# Patient Record
Sex: Male | Born: 1945 | Race: White | Hispanic: No | Marital: Married | Smoking: Never smoker
Health system: Southern US, Academic
[De-identification: ages and names within clinical notes are randomized; demographics above are authoritative.]

## PROBLEM LIST (undated history)

## (undated) DIAGNOSIS — M869 Osteomyelitis, unspecified: Secondary | ICD-10-CM

## (undated) DIAGNOSIS — E079 Disorder of thyroid, unspecified: Secondary | ICD-10-CM

## (undated) DIAGNOSIS — R7989 Other specified abnormal findings of blood chemistry: Secondary | ICD-10-CM

## (undated) DIAGNOSIS — I739 Peripheral vascular disease, unspecified: Secondary | ICD-10-CM

## (undated) DIAGNOSIS — C801 Malignant (primary) neoplasm, unspecified: Secondary | ICD-10-CM

## (undated) DIAGNOSIS — R001 Bradycardia, unspecified: Secondary | ICD-10-CM

## (undated) DIAGNOSIS — R0600 Dyspnea, unspecified: Secondary | ICD-10-CM

## (undated) DIAGNOSIS — E785 Hyperlipidemia, unspecified: Secondary | ICD-10-CM

## (undated) DIAGNOSIS — K635 Polyp of colon: Secondary | ICD-10-CM

## (undated) DIAGNOSIS — I10 Essential (primary) hypertension: Secondary | ICD-10-CM

## (undated) DIAGNOSIS — E119 Type 2 diabetes mellitus without complications: Secondary | ICD-10-CM

## (undated) DIAGNOSIS — R0609 Other forms of dyspnea: Secondary | ICD-10-CM

## (undated) DIAGNOSIS — G4730 Sleep apnea, unspecified: Secondary | ICD-10-CM

## (undated) DIAGNOSIS — C61 Malignant neoplasm of prostate: Secondary | ICD-10-CM

## (undated) DIAGNOSIS — I639 Cerebral infarction, unspecified: Secondary | ICD-10-CM

## (undated) DIAGNOSIS — Z973 Presence of spectacles and contact lenses: Secondary | ICD-10-CM

## (undated) DIAGNOSIS — K219 Gastro-esophageal reflux disease without esophagitis: Secondary | ICD-10-CM

## (undated) HISTORY — PX: OTHER SURGICAL HISTORY: SHX169

## (undated) HISTORY — DX: Disorder of thyroid, unspecified: E07.9

## (undated) HISTORY — DX: Osteomyelitis, unspecified: M86.9

## (undated) HISTORY — DX: Essential (primary) hypertension: I10

## (undated) HISTORY — DX: Hyperlipidemia, unspecified: E78.5

## (undated) HISTORY — DX: Other forms of dyspnea: R06.09

## (undated) HISTORY — PX: AMPUTATION: SHX166

## (undated) HISTORY — DX: Other specified abnormal findings of blood chemistry: R79.89

## (undated) HISTORY — DX: Malignant (primary) neoplasm, unspecified: C80.1

## (undated) HISTORY — DX: Polyp of colon: K63.5

## (undated) HISTORY — DX: Peripheral vascular disease, unspecified: I73.9

## (undated) HISTORY — PX: HEMICOLECTOMY: SHX854

## (undated) HISTORY — DX: Type 2 diabetes mellitus without complications: E11.9

## (undated) HISTORY — DX: Dyspnea, unspecified: R06.00

## (undated) HISTORY — DX: Bradycardia, unspecified: R00.1

## (undated) HISTORY — DX: Sleep apnea, unspecified: G47.30

## (undated) HISTORY — DX: Gastro-esophageal reflux disease without esophagitis: K21.9

## (undated) HISTORY — PX: HX APPENDECTOMY: SHX54

## (undated) HISTORY — PX: TOE AMPUTATION: SHX809

## (undated) HISTORY — PX: BLADDER SURGERY: SHX569

## (undated) HISTORY — PX: HX COLECTOMY: SHX59

## (undated) HISTORY — DX: Type 2 diabetes mellitus without complications (CMS HCC): E11.9

## (undated) HISTORY — PX: HX CORONARY ARTERY BYPASS GRAFT: SHX141

## (undated) HISTORY — DX: Malignant neoplasm of prostate (CMS HCC): C61

## (undated) HISTORY — DX: Presence of spectacles and contact lenses: Z97.3

## (undated) HISTORY — PX: CORONARY ARTERY ANGIOPLASTY: PR CATH30428

## (undated) HISTORY — DX: Cerebral infarction, unspecified (CMS HCC): I63.9

## (undated) HISTORY — PX: CARDIAC PACEMAKER PLACEMENT: SHX583

---

## 2010-05-28 DIAGNOSIS — R079 Chest pain, unspecified: Secondary | ICD-10-CM

## 2011-10-04 DIAGNOSIS — E119 Type 2 diabetes mellitus without complications: Secondary | ICD-10-CM | POA: Diagnosis not present

## 2011-10-04 DIAGNOSIS — E1149 Type 2 diabetes mellitus with other diabetic neurological complication: Secondary | ICD-10-CM | POA: Diagnosis not present

## 2011-10-11 DIAGNOSIS — E782 Mixed hyperlipidemia: Secondary | ICD-10-CM | POA: Diagnosis not present

## 2011-11-09 ENCOUNTER — Telehealth: Payer: Self-pay | Admitting: Vascular Surgery

## 2011-11-09 NOTE — Telephone Encounter (Signed)
Per Rip Harbour, "done"

## 2011-11-09 NOTE — Telephone Encounter (Signed)
Message copied by Gena Fray on Tue Nov 09, 2011 12:55 PM ------      Message from: Dimple Nanas      Created: Tue Nov 09, 2011 10:09 AM      Contact: [phone]       done      ----- Message -----         From: Gena Fray         Sent: 11/09/2011   9:33 AM           To: Dimple Nanas                        ----- Message -----         From: Merleen Nicely         Sent: 11/09/2011   9:27 AM           To: [facility] Admin Pool            Larry Hart DOB: 01/30/1944 wants to see if we received the referral from his doc. He wants to make an appointment. Please call.            Thanks      Ebony Hail

## 2011-11-22 ENCOUNTER — Encounter: Payer: BC Managed Care – PPO | Admitting: Vascular Surgery

## 2011-11-22 DIAGNOSIS — L089 Local infection of the skin and subcutaneous tissue, unspecified: Secondary | ICD-10-CM | POA: Diagnosis not present

## 2011-11-22 DIAGNOSIS — S90426A Blister (nonthermal), unspecified lesser toe(s), initial encounter: Secondary | ICD-10-CM | POA: Diagnosis not present

## 2011-11-22 DIAGNOSIS — E119 Type 2 diabetes mellitus without complications: Secondary | ICD-10-CM | POA: Diagnosis not present

## 2011-11-29 DIAGNOSIS — L02619 Cutaneous abscess of unspecified foot: Secondary | ICD-10-CM | POA: Diagnosis not present

## 2011-11-29 DIAGNOSIS — L03119 Cellulitis of unspecified part of limb: Secondary | ICD-10-CM | POA: Diagnosis not present

## 2011-12-06 DIAGNOSIS — IMO0002 Reserved for concepts with insufficient information to code with codable children: Secondary | ICD-10-CM | POA: Diagnosis not present

## 2011-12-06 DIAGNOSIS — L03039 Cellulitis of unspecified toe: Secondary | ICD-10-CM | POA: Diagnosis not present

## 2011-12-06 DIAGNOSIS — M79609 Pain in unspecified limb: Secondary | ICD-10-CM | POA: Diagnosis not present

## 2011-12-06 DIAGNOSIS — L02619 Cutaneous abscess of unspecified foot: Secondary | ICD-10-CM | POA: Diagnosis not present

## 2011-12-20 DIAGNOSIS — E119 Type 2 diabetes mellitus without complications: Secondary | ICD-10-CM | POA: Diagnosis not present

## 2011-12-20 DIAGNOSIS — L089 Local infection of the skin and subcutaneous tissue, unspecified: Secondary | ICD-10-CM | POA: Diagnosis not present

## 2011-12-20 DIAGNOSIS — E1149 Type 2 diabetes mellitus with other diabetic neurological complication: Secondary | ICD-10-CM | POA: Diagnosis not present

## 2011-12-22 DIAGNOSIS — E785 Hyperlipidemia, unspecified: Secondary | ICD-10-CM | POA: Diagnosis not present

## 2011-12-22 DIAGNOSIS — I739 Peripheral vascular disease, unspecified: Secondary | ICD-10-CM | POA: Diagnosis not present

## 2011-12-22 DIAGNOSIS — Z794 Long term (current) use of insulin: Secondary | ICD-10-CM | POA: Diagnosis not present

## 2011-12-22 DIAGNOSIS — R0602 Shortness of breath: Secondary | ICD-10-CM | POA: Diagnosis not present

## 2011-12-22 DIAGNOSIS — Z8249 Family history of ischemic heart disease and other diseases of the circulatory system: Secondary | ICD-10-CM | POA: Diagnosis not present

## 2011-12-22 DIAGNOSIS — E1142 Type 2 diabetes mellitus with diabetic polyneuropathy: Secondary | ICD-10-CM | POA: Diagnosis not present

## 2011-12-22 DIAGNOSIS — R0789 Other chest pain: Secondary | ICD-10-CM | POA: Diagnosis not present

## 2011-12-22 DIAGNOSIS — Z79899 Other long term (current) drug therapy: Secondary | ICD-10-CM | POA: Diagnosis not present

## 2011-12-22 DIAGNOSIS — R079 Chest pain, unspecified: Secondary | ICD-10-CM | POA: Diagnosis not present

## 2011-12-22 DIAGNOSIS — R030 Elevated blood-pressure reading, without diagnosis of hypertension: Secondary | ICD-10-CM | POA: Diagnosis not present

## 2011-12-22 DIAGNOSIS — R002 Palpitations: Secondary | ICD-10-CM | POA: Diagnosis not present

## 2011-12-22 DIAGNOSIS — F411 Generalized anxiety disorder: Secondary | ICD-10-CM | POA: Diagnosis not present

## 2011-12-22 DIAGNOSIS — Z7982 Long term (current) use of aspirin: Secondary | ICD-10-CM | POA: Diagnosis not present

## 2011-12-22 DIAGNOSIS — Z888 Allergy status to other drugs, medicaments and biological substances status: Secondary | ICD-10-CM | POA: Diagnosis not present

## 2011-12-22 DIAGNOSIS — E1149 Type 2 diabetes mellitus with other diabetic neurological complication: Secondary | ICD-10-CM | POA: Diagnosis not present

## 2011-12-22 DIAGNOSIS — E119 Type 2 diabetes mellitus without complications: Secondary | ICD-10-CM | POA: Diagnosis not present

## 2011-12-22 DIAGNOSIS — I10 Essential (primary) hypertension: Secondary | ICD-10-CM | POA: Diagnosis not present

## 2011-12-22 DIAGNOSIS — R0989 Other specified symptoms and signs involving the circulatory and respiratory systems: Secondary | ICD-10-CM | POA: Diagnosis not present

## 2011-12-22 DIAGNOSIS — Z2821 Immunization not carried out because of patient refusal: Secondary | ICD-10-CM | POA: Diagnosis not present

## 2011-12-22 DIAGNOSIS — S98139A Complete traumatic amputation of one unspecified lesser toe, initial encounter: Secondary | ICD-10-CM | POA: Diagnosis not present

## 2011-12-23 DIAGNOSIS — R079 Chest pain, unspecified: Secondary | ICD-10-CM | POA: Diagnosis not present

## 2011-12-23 DIAGNOSIS — I209 Angina pectoris, unspecified: Secondary | ICD-10-CM | POA: Diagnosis not present

## 2011-12-23 DIAGNOSIS — R0602 Shortness of breath: Secondary | ICD-10-CM | POA: Diagnosis not present

## 2011-12-23 DIAGNOSIS — R002 Palpitations: Secondary | ICD-10-CM | POA: Diagnosis not present

## 2011-12-27 ENCOUNTER — Encounter: Payer: Self-pay | Admitting: Physician Assistant

## 2011-12-27 DIAGNOSIS — R0602 Shortness of breath: Secondary | ICD-10-CM | POA: Diagnosis not present

## 2011-12-27 DIAGNOSIS — R079 Chest pain, unspecified: Secondary | ICD-10-CM | POA: Diagnosis not present

## 2011-12-31 ENCOUNTER — Telehealth: Payer: Self-pay | Admitting: *Deleted

## 2011-12-31 NOTE — Telephone Encounter (Signed)
Message copied by Laurine Blazer on Fri Dec 31, 2011 10:09 AM ------      Message from: Aurora Mask C      Created: Thu Dec 30, 2011  3:49 PM       Nondiagnostic stress test, secondary to suboptimal heart rate. Need to review at followup office visit.

## 2011-12-31 NOTE — Telephone Encounter (Signed)
Notes Recorded by Laurine Blazer, LPN on [id number] at [id number] AM Patient notified. Has eph scheduled for 10/3 with Gene. ------  Notes Recorded by Laurine Blazer, LPN on [id number] at 579FGE AM Left message to return call.

## 2012-01-03 ENCOUNTER — Encounter: Payer: PRIVATE HEALTH INSURANCE | Admitting: Cardiology

## 2012-01-04 ENCOUNTER — Encounter: Payer: Self-pay | Admitting: Cardiology

## 2012-01-06 DIAGNOSIS — G619 Inflammatory polyneuropathy, unspecified: Secondary | ICD-10-CM | POA: Diagnosis not present

## 2012-01-06 DIAGNOSIS — I10 Essential (primary) hypertension: Secondary | ICD-10-CM | POA: Diagnosis not present

## 2012-01-06 DIAGNOSIS — G622 Polyneuropathy due to other toxic agents: Secondary | ICD-10-CM | POA: Diagnosis not present

## 2012-01-11 DIAGNOSIS — E7800 Pure hypercholesterolemia, unspecified: Secondary | ICD-10-CM | POA: Diagnosis not present

## 2012-01-13 ENCOUNTER — Ambulatory Visit (INDEPENDENT_AMBULATORY_CARE_PROVIDER_SITE_OTHER): Payer: Medicare Other | Admitting: Physician Assistant

## 2012-01-13 ENCOUNTER — Encounter: Payer: Self-pay | Admitting: Physician Assistant

## 2012-01-13 ENCOUNTER — Other Ambulatory Visit: Payer: Self-pay | Admitting: Physician Assistant

## 2012-01-13 ENCOUNTER — Encounter: Payer: Self-pay | Admitting: *Deleted

## 2012-01-13 VITALS — BP 142/72 | HR 60 | Ht 71.0 in | Wt 212.0 lb

## 2012-01-13 DIAGNOSIS — R0609 Other forms of dyspnea: Secondary | ICD-10-CM | POA: Diagnosis not present

## 2012-01-13 DIAGNOSIS — I10 Essential (primary) hypertension: Secondary | ICD-10-CM | POA: Diagnosis not present

## 2012-01-13 DIAGNOSIS — I498 Other specified cardiac arrhythmias: Secondary | ICD-10-CM

## 2012-01-13 DIAGNOSIS — E119 Type 2 diabetes mellitus without complications: Secondary | ICD-10-CM

## 2012-01-13 DIAGNOSIS — R001 Bradycardia, unspecified: Secondary | ICD-10-CM

## 2012-01-13 DIAGNOSIS — R0989 Other specified symptoms and signs involving the circulatory and respiratory systems: Secondary | ICD-10-CM

## 2012-01-13 NOTE — Progress Notes (Signed)
Primary Cardiologist: Minus Breeding, MD   HPI: Post hospital followup from [facility], following evaluation for CP. No known history of CAD, with a normal Lexiscan Cardiolite, 2/12. He ruled out for MI with NL troponins.   -2-D echo: EF 60-65%, no focal WMAs, no significant abnormalities, no pericardial effusion.  We recommended further evaluation as an outpatient with a routine GXT. This was performed September 16; unfortunately, this was a suboptimal study (72% PMHR), after patient exercised for 4 1/2 minutes. No chest pain was reported. Patient did complain of SOB. Equivocal ST segment changes, with no definite arrhythmias noted.  Clinically, he continues to experience significant DOE, but no chest pain. He was recently placed on amlodipine 10 mg daily, per Dr. Nadara Mustard. He has been very compulsive in taking ambulatory BP readings at home and presented these today. In general, he has systolic readings in [id number] range. Of note, he did not take any of his HTN medications this morning, and had a reading of 188/91 prior to coming to the office.  Allergies  Allergen Reactions  . Honey Bee Treatment (Bee Venom)     Current Outpatient Prescriptions  Medication Sig Dispense Refill  . amLODipine (NORVASC) 10 MG tablet Take 10 mg by mouth daily.      Marland Kitchen aspirin 81 MG tablet Take 81 mg by mouth daily.      . cetirizine (ZYRTEC) 10 MG tablet Take 10 mg by mouth daily.      . Chromium-Cinnamon (CINNAMON PLUS CHROMIUM PO) Take 500 mg by mouth daily.      . clonazePAM (KLONOPIN) 0.5 MG tablet Take 0.25 mg by mouth 3 (three) times daily.      . Evening Primrose topical oil Apply 1,300 mg topically daily.      . fenofibrate 160 MG tablet Take 160 mg by mouth daily.      Marland Kitchen gabapentin (NEURONTIN) 600 MG tablet Take 1,200 mg by mouth 3 (three) times daily.       . Garlic 123XX123 MG CAPS Take 1,000 mg by mouth daily.      . hydrochlorothiazide (HYDRODIURIL) 25 MG tablet Take 25 mg by mouth daily.      . insulin  glargine (LANTUS) 100 UNIT/ML injection Inject 60 Units into the skin at bedtime.      . insulin lispro (HUMALOG) 100 UNIT/ML injection Inject into the skin 3 (three) times daily before meals.      Marland Kitchen lisinopril (PRINIVIL,ZESTRIL) 10 MG tablet Take 10 mg by mouth daily.      Marland Kitchen NITROSTAT 0.4 MG SL tablet Place 0.4 mg under the tongue every 5 (five) minutes as needed.       Marland Kitchen omega-3 acid ethyl esters (LOVAZA) 1 G capsule Take 2 g by mouth 2 (two) times daily.      Marland Kitchen Ubiquinol 200 MG CAPS Take 200 mg by mouth daily.      Marland Kitchen Zn-Pyg Afri-Nettle-Saw Palmet (SAW PALMETTO COMPLEX PO) Take 1 capsule by mouth daily.        Past Medical History  Diagnosis Date  . Diabetes mellitus   . Hypertension   . Dyslipidemia   . Peripheral vascular disease   . Colon polyp     Status post hemicolectomy  . Osteomyelitis     Status post amputation  . Dyspnea on exertion   . Sinus bradycardia     Past Surgical History  Procedure Date  . Amputation   . Hemicolectomy     History   Social History  .  Marital Status: Married    Spouse Name: N/A    Number of Children: N/A  . Years of Education: N/A   Occupational History  . Not on file.   Social History Main Topics  . Smoking status: Never Smoker   . Smokeless tobacco: Not on file  . Alcohol Use: No  . Drug Use: No  . Sexually Active: Not on file   Other Topics Concern  . Not on file   Social History Narrative  . No narrative on file    Family History  Problem Relation Age of Onset  . Diabetes    . Hypertension    . Coronary artery disease Father 58    ROS: no nausea, vomiting; no fever, chills; no melena, hematochezia; no claudication  PHYSICAL EXAM: BP 142/72  Pulse 60  Ht 5\' 11"  (1.803 m)  Wt 212 lb (96.163 kg)  BMI 29.57 kg/m2 GENERAL: 66 year-old male, obese; NAD HEENT: NCAT, PERRLA, EOMI; sclera clear; no xanthelasma NECK: no JVD; no TM LUNGS: CTA bilaterally CARDIAC: RRR (S1, S2); no significant murmurs; no rubs or  gallops ABDOMEN: Protuberant EXTREMETIES: no significant peripheral edema SKIN: warm/dry; no obvious rash/lesions MUSCULOSKELETAL: no joint deformity NEURO: no focal deficit; NL affect   EKG:    ASSESSMENT & PLAN:  Dyspnea on exertion Will proceed with a dobutamine stress echocardiogram for risk stratification, in light of recent nondiagnostic suboptimal routine GXT. Patient continues to experience significant DOE, with no associated CP. I am concerned that this represents his anginal equivalent. He has multiple CRFs, including DM. If this study is suggestive of ischemia, then we will pursue further evaluation of the cardiac catheterization. We did discuss this possibility today. On the other hand, if the study is negative, then I would suggest no further workup, and patient is to continue an aggressive low fat/salt diet and exercise program. Patient is to take his usual a.m. medications, prior to the procedure.  Hypertension Improved, following recent addition of amlodipine 10 mg daily.  Sinus bradycardia Patient has not had any rate slowing agents. There was no evidence of dysrhythmia during recent routine GXT.  Diabetes mellitus Followed by primary M.D.    Gene Shataya Winkles, PAC A assessed at this is

## 2012-01-13 NOTE — Assessment & Plan Note (Signed)
Improved, following recent addition of amlodipine 10 mg daily.

## 2012-01-13 NOTE — Assessment & Plan Note (Signed)
Followed by primary M.D. 

## 2012-01-13 NOTE — Patient Instructions (Addendum)
   Dobutamine stress echo  Office will contact with results Continue all current medications.  Follow up in  2 weeks

## 2012-01-13 NOTE — Assessment & Plan Note (Signed)
Patient has not had any rate slowing agents. There was no evidence of dysrhythmia during recent routine GXT.

## 2012-01-13 NOTE — Assessment & Plan Note (Addendum)
Will proceed with a dobutamine stress echocardiogram for risk stratification, in light of recent nondiagnostic suboptimal routine GXT. Patient continues to experience significant DOE, with no associated CP. I am concerned that this represents his anginal equivalent. He has multiple CRFs, including DM. If this study is suggestive of ischemia, then we will pursue further evaluation of the cardiac catheterization. We did discuss this possibility today. On the other hand, if the study is negative, then I would suggest no further workup, and patient is to continue an aggressive low fat/salt diet and exercise program. Patient is to take his usual a.m. medications, prior to the procedure.

## 2012-01-19 DIAGNOSIS — R0609 Other forms of dyspnea: Secondary | ICD-10-CM | POA: Diagnosis not present

## 2012-01-19 DIAGNOSIS — R079 Chest pain, unspecified: Secondary | ICD-10-CM | POA: Diagnosis not present

## 2012-01-25 DIAGNOSIS — G622 Polyneuropathy due to other toxic agents: Secondary | ICD-10-CM | POA: Diagnosis not present

## 2012-01-25 DIAGNOSIS — G619 Inflammatory polyneuropathy, unspecified: Secondary | ICD-10-CM | POA: Diagnosis not present

## 2012-01-25 DIAGNOSIS — I10 Essential (primary) hypertension: Secondary | ICD-10-CM | POA: Diagnosis not present

## 2012-01-25 DIAGNOSIS — Z23 Encounter for immunization: Secondary | ICD-10-CM | POA: Diagnosis not present

## 2012-01-26 ENCOUNTER — Ambulatory Visit: Payer: Medicare Other | Admitting: Physician Assistant

## 2012-01-27 DIAGNOSIS — R0602 Shortness of breath: Secondary | ICD-10-CM | POA: Diagnosis not present

## 2012-02-15 DIAGNOSIS — E11319 Type 2 diabetes mellitus with unspecified diabetic retinopathy without macular edema: Secondary | ICD-10-CM | POA: Diagnosis not present

## 2012-02-15 DIAGNOSIS — Z961 Presence of intraocular lens: Secondary | ICD-10-CM | POA: Diagnosis not present

## 2012-02-15 DIAGNOSIS — H3560 Retinal hemorrhage, unspecified eye: Secondary | ICD-10-CM | POA: Diagnosis not present

## 2012-02-17 DIAGNOSIS — H3582 Retinal ischemia: Secondary | ICD-10-CM | POA: Diagnosis not present

## 2012-02-21 DIAGNOSIS — L97509 Non-pressure chronic ulcer of other part of unspecified foot with unspecified severity: Secondary | ICD-10-CM | POA: Diagnosis not present

## 2012-02-21 DIAGNOSIS — E119 Type 2 diabetes mellitus without complications: Secondary | ICD-10-CM | POA: Diagnosis not present

## 2012-02-22 DIAGNOSIS — B353 Tinea pedis: Secondary | ICD-10-CM | POA: Diagnosis not present

## 2012-02-22 DIAGNOSIS — L570 Actinic keratosis: Secondary | ICD-10-CM | POA: Diagnosis not present

## 2012-02-22 DIAGNOSIS — L219 Seborrheic dermatitis, unspecified: Secondary | ICD-10-CM | POA: Diagnosis not present

## 2012-02-22 DIAGNOSIS — B359 Dermatophytosis, unspecified: Secondary | ICD-10-CM | POA: Diagnosis not present

## 2012-03-06 DIAGNOSIS — E1149 Type 2 diabetes mellitus with other diabetic neurological complication: Secondary | ICD-10-CM | POA: Diagnosis not present

## 2012-03-06 DIAGNOSIS — E119 Type 2 diabetes mellitus without complications: Secondary | ICD-10-CM | POA: Diagnosis not present

## 2012-03-08 DIAGNOSIS — I10 Essential (primary) hypertension: Secondary | ICD-10-CM | POA: Diagnosis not present

## 2012-03-08 DIAGNOSIS — G619 Inflammatory polyneuropathy, unspecified: Secondary | ICD-10-CM | POA: Diagnosis not present

## 2012-03-08 DIAGNOSIS — G622 Polyneuropathy due to other toxic agents: Secondary | ICD-10-CM | POA: Diagnosis not present

## 2012-03-17 DIAGNOSIS — Z7982 Long term (current) use of aspirin: Secondary | ICD-10-CM | POA: Diagnosis not present

## 2012-03-17 DIAGNOSIS — Z79899 Other long term (current) drug therapy: Secondary | ICD-10-CM | POA: Diagnosis not present

## 2012-03-17 DIAGNOSIS — E119 Type 2 diabetes mellitus without complications: Secondary | ICD-10-CM | POA: Diagnosis not present

## 2012-03-17 DIAGNOSIS — Z794 Long term (current) use of insulin: Secondary | ICD-10-CM | POA: Diagnosis not present

## 2012-03-17 DIAGNOSIS — I10 Essential (primary) hypertension: Secondary | ICD-10-CM | POA: Diagnosis not present

## 2012-03-17 DIAGNOSIS — F411 Generalized anxiety disorder: Secondary | ICD-10-CM | POA: Diagnosis not present

## 2012-03-21 DIAGNOSIS — S335XXA Sprain of ligaments of lumbar spine, initial encounter: Secondary | ICD-10-CM | POA: Diagnosis not present

## 2012-03-21 DIAGNOSIS — M999 Biomechanical lesion, unspecified: Secondary | ICD-10-CM | POA: Diagnosis not present

## 2012-03-21 DIAGNOSIS — M5430 Sciatica, unspecified side: Secondary | ICD-10-CM | POA: Diagnosis not present

## 2012-03-23 DIAGNOSIS — E1139 Type 2 diabetes mellitus with other diabetic ophthalmic complication: Secondary | ICD-10-CM | POA: Diagnosis not present

## 2012-03-23 DIAGNOSIS — E11311 Type 2 diabetes mellitus with unspecified diabetic retinopathy with macular edema: Secondary | ICD-10-CM | POA: Diagnosis not present

## 2012-03-23 DIAGNOSIS — H579 Unspecified disorder of eye and adnexa: Secondary | ICD-10-CM | POA: Diagnosis not present

## 2012-03-30 DIAGNOSIS — G619 Inflammatory polyneuropathy, unspecified: Secondary | ICD-10-CM | POA: Diagnosis not present

## 2012-03-30 DIAGNOSIS — G622 Polyneuropathy due to other toxic agents: Secondary | ICD-10-CM | POA: Diagnosis not present

## 2012-03-30 DIAGNOSIS — I10 Essential (primary) hypertension: Secondary | ICD-10-CM | POA: Diagnosis not present

## 2012-05-15 DIAGNOSIS — E1149 Type 2 diabetes mellitus with other diabetic neurological complication: Secondary | ICD-10-CM | POA: Diagnosis not present

## 2012-05-15 DIAGNOSIS — E119 Type 2 diabetes mellitus without complications: Secondary | ICD-10-CM | POA: Diagnosis not present

## 2012-05-16 DIAGNOSIS — I10 Essential (primary) hypertension: Secondary | ICD-10-CM | POA: Diagnosis not present

## 2012-05-16 DIAGNOSIS — G619 Inflammatory polyneuropathy, unspecified: Secondary | ICD-10-CM | POA: Diagnosis not present

## 2012-06-05 DIAGNOSIS — L97509 Non-pressure chronic ulcer of other part of unspecified foot with unspecified severity: Secondary | ICD-10-CM | POA: Diagnosis not present

## 2012-06-05 DIAGNOSIS — M79609 Pain in unspecified limb: Secondary | ICD-10-CM | POA: Diagnosis not present

## 2012-06-07 DIAGNOSIS — L0291 Cutaneous abscess, unspecified: Secondary | ICD-10-CM | POA: Diagnosis not present

## 2012-06-07 DIAGNOSIS — L0390 Cellulitis, unspecified: Secondary | ICD-10-CM | POA: Diagnosis not present

## 2012-06-07 DIAGNOSIS — L570 Actinic keratosis: Secondary | ICD-10-CM | POA: Diagnosis not present

## 2012-06-07 DIAGNOSIS — L219 Seborrheic dermatitis, unspecified: Secondary | ICD-10-CM | POA: Diagnosis not present

## 2012-06-19 DIAGNOSIS — L97509 Non-pressure chronic ulcer of other part of unspecified foot with unspecified severity: Secondary | ICD-10-CM | POA: Diagnosis not present

## 2012-06-19 DIAGNOSIS — E119 Type 2 diabetes mellitus without complications: Secondary | ICD-10-CM | POA: Diagnosis not present

## 2012-07-03 DIAGNOSIS — E119 Type 2 diabetes mellitus without complications: Secondary | ICD-10-CM | POA: Diagnosis not present

## 2012-07-03 DIAGNOSIS — L97509 Non-pressure chronic ulcer of other part of unspecified foot with unspecified severity: Secondary | ICD-10-CM | POA: Diagnosis not present

## 2012-07-11 DIAGNOSIS — E782 Mixed hyperlipidemia: Secondary | ICD-10-CM | POA: Diagnosis not present

## 2012-07-11 DIAGNOSIS — E785 Hyperlipidemia, unspecified: Secondary | ICD-10-CM | POA: Diagnosis not present

## 2012-07-24 DIAGNOSIS — L97509 Non-pressure chronic ulcer of other part of unspecified foot with unspecified severity: Secondary | ICD-10-CM | POA: Diagnosis not present

## 2012-07-24 DIAGNOSIS — E119 Type 2 diabetes mellitus without complications: Secondary | ICD-10-CM | POA: Diagnosis not present

## 2012-08-10 DIAGNOSIS — E11311 Type 2 diabetes mellitus with unspecified diabetic retinopathy with macular edema: Secondary | ICD-10-CM | POA: Diagnosis not present

## 2012-08-10 DIAGNOSIS — E1139 Type 2 diabetes mellitus with other diabetic ophthalmic complication: Secondary | ICD-10-CM | POA: Diagnosis not present

## 2012-08-21 DIAGNOSIS — E1149 Type 2 diabetes mellitus with other diabetic neurological complication: Secondary | ICD-10-CM | POA: Diagnosis not present

## 2012-08-21 DIAGNOSIS — E119 Type 2 diabetes mellitus without complications: Secondary | ICD-10-CM | POA: Diagnosis not present

## 2012-08-23 DIAGNOSIS — G619 Inflammatory polyneuropathy, unspecified: Secondary | ICD-10-CM | POA: Diagnosis not present

## 2012-08-23 DIAGNOSIS — E119 Type 2 diabetes mellitus without complications: Secondary | ICD-10-CM | POA: Diagnosis not present

## 2012-08-23 DIAGNOSIS — E559 Vitamin D deficiency, unspecified: Secondary | ICD-10-CM | POA: Diagnosis not present

## 2012-08-23 DIAGNOSIS — I10 Essential (primary) hypertension: Secondary | ICD-10-CM | POA: Diagnosis not present

## 2012-08-23 DIAGNOSIS — E538 Deficiency of other specified B group vitamins: Secondary | ICD-10-CM | POA: Diagnosis not present

## 2012-09-26 DIAGNOSIS — R5383 Other fatigue: Secondary | ICD-10-CM | POA: Diagnosis not present

## 2012-10-02 DIAGNOSIS — M4712 Other spondylosis with myelopathy, cervical region: Secondary | ICD-10-CM | POA: Diagnosis not present

## 2012-10-02 DIAGNOSIS — M47817 Spondylosis without myelopathy or radiculopathy, lumbosacral region: Secondary | ICD-10-CM | POA: Diagnosis not present

## 2012-10-02 DIAGNOSIS — M5430 Sciatica, unspecified side: Secondary | ICD-10-CM | POA: Diagnosis not present

## 2012-10-02 DIAGNOSIS — M546 Pain in thoracic spine: Secondary | ICD-10-CM | POA: Diagnosis not present

## 2012-10-02 DIAGNOSIS — M999 Biomechanical lesion, unspecified: Secondary | ICD-10-CM | POA: Diagnosis not present

## 2012-10-02 DIAGNOSIS — M9981 Other biomechanical lesions of cervical region: Secondary | ICD-10-CM | POA: Diagnosis not present

## 2012-10-03 DIAGNOSIS — M5430 Sciatica, unspecified side: Secondary | ICD-10-CM | POA: Diagnosis not present

## 2012-10-03 DIAGNOSIS — M9981 Other biomechanical lesions of cervical region: Secondary | ICD-10-CM | POA: Diagnosis not present

## 2012-10-03 DIAGNOSIS — M47817 Spondylosis without myelopathy or radiculopathy, lumbosacral region: Secondary | ICD-10-CM | POA: Diagnosis not present

## 2012-10-03 DIAGNOSIS — M999 Biomechanical lesion, unspecified: Secondary | ICD-10-CM | POA: Diagnosis not present

## 2012-10-03 DIAGNOSIS — M4712 Other spondylosis with myelopathy, cervical region: Secondary | ICD-10-CM | POA: Diagnosis not present

## 2012-10-03 DIAGNOSIS — M546 Pain in thoracic spine: Secondary | ICD-10-CM | POA: Diagnosis not present

## 2012-10-05 DIAGNOSIS — M4712 Other spondylosis with myelopathy, cervical region: Secondary | ICD-10-CM | POA: Diagnosis not present

## 2012-10-05 DIAGNOSIS — M546 Pain in thoracic spine: Secondary | ICD-10-CM | POA: Diagnosis not present

## 2012-10-05 DIAGNOSIS — M9981 Other biomechanical lesions of cervical region: Secondary | ICD-10-CM | POA: Diagnosis not present

## 2012-10-05 DIAGNOSIS — M47817 Spondylosis without myelopathy or radiculopathy, lumbosacral region: Secondary | ICD-10-CM | POA: Diagnosis not present

## 2012-10-05 DIAGNOSIS — M999 Biomechanical lesion, unspecified: Secondary | ICD-10-CM | POA: Diagnosis not present

## 2012-10-05 DIAGNOSIS — M5430 Sciatica, unspecified side: Secondary | ICD-10-CM | POA: Diagnosis not present

## 2012-10-09 DIAGNOSIS — M9981 Other biomechanical lesions of cervical region: Secondary | ICD-10-CM | POA: Diagnosis not present

## 2012-10-09 DIAGNOSIS — M999 Biomechanical lesion, unspecified: Secondary | ICD-10-CM | POA: Diagnosis not present

## 2012-10-09 DIAGNOSIS — M5430 Sciatica, unspecified side: Secondary | ICD-10-CM | POA: Diagnosis not present

## 2012-10-09 DIAGNOSIS — M546 Pain in thoracic spine: Secondary | ICD-10-CM | POA: Diagnosis not present

## 2012-10-09 DIAGNOSIS — M4712 Other spondylosis with myelopathy, cervical region: Secondary | ICD-10-CM | POA: Diagnosis not present

## 2012-10-09 DIAGNOSIS — M47817 Spondylosis without myelopathy or radiculopathy, lumbosacral region: Secondary | ICD-10-CM | POA: Diagnosis not present

## 2012-10-16 DIAGNOSIS — M4712 Other spondylosis with myelopathy, cervical region: Secondary | ICD-10-CM | POA: Diagnosis not present

## 2012-10-16 DIAGNOSIS — M546 Pain in thoracic spine: Secondary | ICD-10-CM | POA: Diagnosis not present

## 2012-10-16 DIAGNOSIS — M9981 Other biomechanical lesions of cervical region: Secondary | ICD-10-CM | POA: Diagnosis not present

## 2012-10-16 DIAGNOSIS — M5430 Sciatica, unspecified side: Secondary | ICD-10-CM | POA: Diagnosis not present

## 2012-10-16 DIAGNOSIS — M999 Biomechanical lesion, unspecified: Secondary | ICD-10-CM | POA: Diagnosis not present

## 2012-10-16 DIAGNOSIS — M47817 Spondylosis without myelopathy or radiculopathy, lumbosacral region: Secondary | ICD-10-CM | POA: Diagnosis not present

## 2012-10-19 DIAGNOSIS — M546 Pain in thoracic spine: Secondary | ICD-10-CM | POA: Diagnosis not present

## 2012-10-19 DIAGNOSIS — M47817 Spondylosis without myelopathy or radiculopathy, lumbosacral region: Secondary | ICD-10-CM | POA: Diagnosis not present

## 2012-10-19 DIAGNOSIS — M999 Biomechanical lesion, unspecified: Secondary | ICD-10-CM | POA: Diagnosis not present

## 2012-10-19 DIAGNOSIS — M4712 Other spondylosis with myelopathy, cervical region: Secondary | ICD-10-CM | POA: Diagnosis not present

## 2012-10-19 DIAGNOSIS — M5430 Sciatica, unspecified side: Secondary | ICD-10-CM | POA: Diagnosis not present

## 2012-10-19 DIAGNOSIS — M9981 Other biomechanical lesions of cervical region: Secondary | ICD-10-CM | POA: Diagnosis not present

## 2012-10-25 DIAGNOSIS — M47817 Spondylosis without myelopathy or radiculopathy, lumbosacral region: Secondary | ICD-10-CM | POA: Diagnosis not present

## 2012-10-25 DIAGNOSIS — M9981 Other biomechanical lesions of cervical region: Secondary | ICD-10-CM | POA: Diagnosis not present

## 2012-10-25 DIAGNOSIS — M4712 Other spondylosis with myelopathy, cervical region: Secondary | ICD-10-CM | POA: Diagnosis not present

## 2012-10-25 DIAGNOSIS — M5430 Sciatica, unspecified side: Secondary | ICD-10-CM | POA: Diagnosis not present

## 2012-10-25 DIAGNOSIS — M999 Biomechanical lesion, unspecified: Secondary | ICD-10-CM | POA: Diagnosis not present

## 2012-10-25 DIAGNOSIS — M546 Pain in thoracic spine: Secondary | ICD-10-CM | POA: Diagnosis not present

## 2012-10-30 DIAGNOSIS — E119 Type 2 diabetes mellitus without complications: Secondary | ICD-10-CM | POA: Diagnosis not present

## 2012-10-30 DIAGNOSIS — E1149 Type 2 diabetes mellitus with other diabetic neurological complication: Secondary | ICD-10-CM | POA: Diagnosis not present

## 2012-11-01 DIAGNOSIS — M5430 Sciatica, unspecified side: Secondary | ICD-10-CM | POA: Diagnosis not present

## 2012-11-01 DIAGNOSIS — M999 Biomechanical lesion, unspecified: Secondary | ICD-10-CM | POA: Diagnosis not present

## 2012-11-01 DIAGNOSIS — M546 Pain in thoracic spine: Secondary | ICD-10-CM | POA: Diagnosis not present

## 2012-11-01 DIAGNOSIS — M4712 Other spondylosis with myelopathy, cervical region: Secondary | ICD-10-CM | POA: Diagnosis not present

## 2012-11-01 DIAGNOSIS — M47817 Spondylosis without myelopathy or radiculopathy, lumbosacral region: Secondary | ICD-10-CM | POA: Diagnosis not present

## 2012-11-01 DIAGNOSIS — M9981 Other biomechanical lesions of cervical region: Secondary | ICD-10-CM | POA: Diagnosis not present

## 2012-11-08 DIAGNOSIS — M47817 Spondylosis without myelopathy or radiculopathy, lumbosacral region: Secondary | ICD-10-CM | POA: Diagnosis not present

## 2012-11-08 DIAGNOSIS — M9981 Other biomechanical lesions of cervical region: Secondary | ICD-10-CM | POA: Diagnosis not present

## 2012-11-08 DIAGNOSIS — M999 Biomechanical lesion, unspecified: Secondary | ICD-10-CM | POA: Diagnosis not present

## 2012-11-08 DIAGNOSIS — M546 Pain in thoracic spine: Secondary | ICD-10-CM | POA: Diagnosis not present

## 2012-11-08 DIAGNOSIS — M5430 Sciatica, unspecified side: Secondary | ICD-10-CM | POA: Diagnosis not present

## 2012-11-08 DIAGNOSIS — M4712 Other spondylosis with myelopathy, cervical region: Secondary | ICD-10-CM | POA: Diagnosis not present

## 2012-11-22 DIAGNOSIS — M546 Pain in thoracic spine: Secondary | ICD-10-CM | POA: Diagnosis not present

## 2012-11-22 DIAGNOSIS — M9981 Other biomechanical lesions of cervical region: Secondary | ICD-10-CM | POA: Diagnosis not present

## 2012-11-22 DIAGNOSIS — M999 Biomechanical lesion, unspecified: Secondary | ICD-10-CM | POA: Diagnosis not present

## 2012-11-22 DIAGNOSIS — M47817 Spondylosis without myelopathy or radiculopathy, lumbosacral region: Secondary | ICD-10-CM | POA: Diagnosis not present

## 2012-11-22 DIAGNOSIS — M4712 Other spondylosis with myelopathy, cervical region: Secondary | ICD-10-CM | POA: Diagnosis not present

## 2012-11-22 DIAGNOSIS — M5430 Sciatica, unspecified side: Secondary | ICD-10-CM | POA: Diagnosis not present

## 2012-11-23 DIAGNOSIS — R5381 Other malaise: Secondary | ICD-10-CM | POA: Diagnosis not present

## 2012-11-23 DIAGNOSIS — G622 Polyneuropathy due to other toxic agents: Secondary | ICD-10-CM | POA: Diagnosis not present

## 2012-11-23 DIAGNOSIS — I10 Essential (primary) hypertension: Secondary | ICD-10-CM | POA: Diagnosis not present

## 2012-11-23 DIAGNOSIS — E119 Type 2 diabetes mellitus without complications: Secondary | ICD-10-CM | POA: Diagnosis not present

## 2012-11-29 DIAGNOSIS — M9981 Other biomechanical lesions of cervical region: Secondary | ICD-10-CM | POA: Diagnosis not present

## 2012-11-29 DIAGNOSIS — M5430 Sciatica, unspecified side: Secondary | ICD-10-CM | POA: Diagnosis not present

## 2012-11-29 DIAGNOSIS — M999 Biomechanical lesion, unspecified: Secondary | ICD-10-CM | POA: Diagnosis not present

## 2012-11-29 DIAGNOSIS — M546 Pain in thoracic spine: Secondary | ICD-10-CM | POA: Diagnosis not present

## 2012-11-29 DIAGNOSIS — M47817 Spondylosis without myelopathy or radiculopathy, lumbosacral region: Secondary | ICD-10-CM | POA: Diagnosis not present

## 2012-11-29 DIAGNOSIS — M4712 Other spondylosis with myelopathy, cervical region: Secondary | ICD-10-CM | POA: Diagnosis not present

## 2012-11-30 DIAGNOSIS — R5381 Other malaise: Secondary | ICD-10-CM | POA: Diagnosis not present

## 2012-11-30 DIAGNOSIS — E119 Type 2 diabetes mellitus without complications: Secondary | ICD-10-CM | POA: Diagnosis not present

## 2012-11-30 DIAGNOSIS — I10 Essential (primary) hypertension: Secondary | ICD-10-CM | POA: Diagnosis not present

## 2012-11-30 DIAGNOSIS — G619 Inflammatory polyneuropathy, unspecified: Secondary | ICD-10-CM | POA: Diagnosis not present

## 2012-11-30 DIAGNOSIS — F329 Major depressive disorder, single episode, unspecified: Secondary | ICD-10-CM | POA: Diagnosis not present

## 2012-12-04 DIAGNOSIS — M4712 Other spondylosis with myelopathy, cervical region: Secondary | ICD-10-CM | POA: Diagnosis not present

## 2012-12-04 DIAGNOSIS — M546 Pain in thoracic spine: Secondary | ICD-10-CM | POA: Diagnosis not present

## 2012-12-04 DIAGNOSIS — M999 Biomechanical lesion, unspecified: Secondary | ICD-10-CM | POA: Diagnosis not present

## 2012-12-04 DIAGNOSIS — M9981 Other biomechanical lesions of cervical region: Secondary | ICD-10-CM | POA: Diagnosis not present

## 2012-12-04 DIAGNOSIS — L570 Actinic keratosis: Secondary | ICD-10-CM | POA: Diagnosis not present

## 2012-12-04 DIAGNOSIS — M5430 Sciatica, unspecified side: Secondary | ICD-10-CM | POA: Diagnosis not present

## 2012-12-04 DIAGNOSIS — M47817 Spondylosis without myelopathy or radiculopathy, lumbosacral region: Secondary | ICD-10-CM | POA: Diagnosis not present

## 2012-12-04 DIAGNOSIS — L219 Seborrheic dermatitis, unspecified: Secondary | ICD-10-CM | POA: Diagnosis not present

## 2012-12-08 DIAGNOSIS — M4712 Other spondylosis with myelopathy, cervical region: Secondary | ICD-10-CM | POA: Diagnosis not present

## 2012-12-08 DIAGNOSIS — M9981 Other biomechanical lesions of cervical region: Secondary | ICD-10-CM | POA: Diagnosis not present

## 2012-12-08 DIAGNOSIS — M5430 Sciatica, unspecified side: Secondary | ICD-10-CM | POA: Diagnosis not present

## 2012-12-08 DIAGNOSIS — M999 Biomechanical lesion, unspecified: Secondary | ICD-10-CM | POA: Diagnosis not present

## 2012-12-08 DIAGNOSIS — M546 Pain in thoracic spine: Secondary | ICD-10-CM | POA: Diagnosis not present

## 2012-12-08 DIAGNOSIS — M47817 Spondylosis without myelopathy or radiculopathy, lumbosacral region: Secondary | ICD-10-CM | POA: Diagnosis not present

## 2012-12-13 DIAGNOSIS — M47817 Spondylosis without myelopathy or radiculopathy, lumbosacral region: Secondary | ICD-10-CM | POA: Diagnosis not present

## 2012-12-13 DIAGNOSIS — M9981 Other biomechanical lesions of cervical region: Secondary | ICD-10-CM | POA: Diagnosis not present

## 2012-12-13 DIAGNOSIS — M4712 Other spondylosis with myelopathy, cervical region: Secondary | ICD-10-CM | POA: Diagnosis not present

## 2012-12-13 DIAGNOSIS — M999 Biomechanical lesion, unspecified: Secondary | ICD-10-CM | POA: Diagnosis not present

## 2012-12-13 DIAGNOSIS — M5430 Sciatica, unspecified side: Secondary | ICD-10-CM | POA: Diagnosis not present

## 2012-12-13 DIAGNOSIS — M546 Pain in thoracic spine: Secondary | ICD-10-CM | POA: Diagnosis not present

## 2012-12-18 DIAGNOSIS — M999 Biomechanical lesion, unspecified: Secondary | ICD-10-CM | POA: Diagnosis not present

## 2012-12-18 DIAGNOSIS — M9981 Other biomechanical lesions of cervical region: Secondary | ICD-10-CM | POA: Diagnosis not present

## 2012-12-18 DIAGNOSIS — M47817 Spondylosis without myelopathy or radiculopathy, lumbosacral region: Secondary | ICD-10-CM | POA: Diagnosis not present

## 2012-12-18 DIAGNOSIS — M546 Pain in thoracic spine: Secondary | ICD-10-CM | POA: Diagnosis not present

## 2012-12-18 DIAGNOSIS — M4712 Other spondylosis with myelopathy, cervical region: Secondary | ICD-10-CM | POA: Diagnosis not present

## 2012-12-18 DIAGNOSIS — M5430 Sciatica, unspecified side: Secondary | ICD-10-CM | POA: Diagnosis not present

## 2012-12-21 DIAGNOSIS — M4712 Other spondylosis with myelopathy, cervical region: Secondary | ICD-10-CM | POA: Diagnosis not present

## 2012-12-21 DIAGNOSIS — M546 Pain in thoracic spine: Secondary | ICD-10-CM | POA: Diagnosis not present

## 2012-12-21 DIAGNOSIS — M47817 Spondylosis without myelopathy or radiculopathy, lumbosacral region: Secondary | ICD-10-CM | POA: Diagnosis not present

## 2012-12-21 DIAGNOSIS — M9981 Other biomechanical lesions of cervical region: Secondary | ICD-10-CM | POA: Diagnosis not present

## 2012-12-21 DIAGNOSIS — M999 Biomechanical lesion, unspecified: Secondary | ICD-10-CM | POA: Diagnosis not present

## 2012-12-21 DIAGNOSIS — M5430 Sciatica, unspecified side: Secondary | ICD-10-CM | POA: Diagnosis not present

## 2013-01-02 DIAGNOSIS — M5430 Sciatica, unspecified side: Secondary | ICD-10-CM | POA: Diagnosis not present

## 2013-01-02 DIAGNOSIS — M9981 Other biomechanical lesions of cervical region: Secondary | ICD-10-CM | POA: Diagnosis not present

## 2013-01-02 DIAGNOSIS — M47817 Spondylosis without myelopathy or radiculopathy, lumbosacral region: Secondary | ICD-10-CM | POA: Diagnosis not present

## 2013-01-02 DIAGNOSIS — M546 Pain in thoracic spine: Secondary | ICD-10-CM | POA: Diagnosis not present

## 2013-01-02 DIAGNOSIS — M999 Biomechanical lesion, unspecified: Secondary | ICD-10-CM | POA: Diagnosis not present

## 2013-01-02 DIAGNOSIS — M4712 Other spondylosis with myelopathy, cervical region: Secondary | ICD-10-CM | POA: Diagnosis not present

## 2013-01-09 DIAGNOSIS — M546 Pain in thoracic spine: Secondary | ICD-10-CM | POA: Diagnosis not present

## 2013-01-09 DIAGNOSIS — M5430 Sciatica, unspecified side: Secondary | ICD-10-CM | POA: Diagnosis not present

## 2013-01-09 DIAGNOSIS — M4712 Other spondylosis with myelopathy, cervical region: Secondary | ICD-10-CM | POA: Diagnosis not present

## 2013-01-09 DIAGNOSIS — M47817 Spondylosis without myelopathy or radiculopathy, lumbosacral region: Secondary | ICD-10-CM | POA: Diagnosis not present

## 2013-01-09 DIAGNOSIS — M9981 Other biomechanical lesions of cervical region: Secondary | ICD-10-CM | POA: Diagnosis not present

## 2013-01-09 DIAGNOSIS — M999 Biomechanical lesion, unspecified: Secondary | ICD-10-CM | POA: Diagnosis not present

## 2013-01-10 DIAGNOSIS — E038 Other specified hypothyroidism: Secondary | ICD-10-CM | POA: Diagnosis not present

## 2013-01-10 DIAGNOSIS — E7800 Pure hypercholesterolemia, unspecified: Secondary | ICD-10-CM | POA: Diagnosis not present

## 2013-01-10 DIAGNOSIS — E782 Mixed hyperlipidemia: Secondary | ICD-10-CM | POA: Diagnosis not present

## 2013-01-15 DIAGNOSIS — E1149 Type 2 diabetes mellitus with other diabetic neurological complication: Secondary | ICD-10-CM | POA: Diagnosis not present

## 2013-01-15 DIAGNOSIS — E119 Type 2 diabetes mellitus without complications: Secondary | ICD-10-CM | POA: Diagnosis not present

## 2013-01-16 DIAGNOSIS — M9981 Other biomechanical lesions of cervical region: Secondary | ICD-10-CM | POA: Diagnosis not present

## 2013-01-16 DIAGNOSIS — M5430 Sciatica, unspecified side: Secondary | ICD-10-CM | POA: Diagnosis not present

## 2013-01-16 DIAGNOSIS — M999 Biomechanical lesion, unspecified: Secondary | ICD-10-CM | POA: Diagnosis not present

## 2013-01-16 DIAGNOSIS — M4712 Other spondylosis with myelopathy, cervical region: Secondary | ICD-10-CM | POA: Diagnosis not present

## 2013-01-16 DIAGNOSIS — M47817 Spondylosis without myelopathy or radiculopathy, lumbosacral region: Secondary | ICD-10-CM | POA: Diagnosis not present

## 2013-01-16 DIAGNOSIS — M546 Pain in thoracic spine: Secondary | ICD-10-CM | POA: Diagnosis not present

## 2013-01-23 DIAGNOSIS — M5430 Sciatica, unspecified side: Secondary | ICD-10-CM | POA: Diagnosis not present

## 2013-01-23 DIAGNOSIS — M47817 Spondylosis without myelopathy or radiculopathy, lumbosacral region: Secondary | ICD-10-CM | POA: Diagnosis not present

## 2013-01-23 DIAGNOSIS — M9981 Other biomechanical lesions of cervical region: Secondary | ICD-10-CM | POA: Diagnosis not present

## 2013-01-23 DIAGNOSIS — M999 Biomechanical lesion, unspecified: Secondary | ICD-10-CM | POA: Diagnosis not present

## 2013-01-23 DIAGNOSIS — M546 Pain in thoracic spine: Secondary | ICD-10-CM | POA: Diagnosis not present

## 2013-01-23 DIAGNOSIS — M4712 Other spondylosis with myelopathy, cervical region: Secondary | ICD-10-CM | POA: Diagnosis not present

## 2013-02-06 DIAGNOSIS — M5430 Sciatica, unspecified side: Secondary | ICD-10-CM | POA: Diagnosis not present

## 2013-02-06 DIAGNOSIS — M47817 Spondylosis without myelopathy or radiculopathy, lumbosacral region: Secondary | ICD-10-CM | POA: Diagnosis not present

## 2013-02-06 DIAGNOSIS — M546 Pain in thoracic spine: Secondary | ICD-10-CM | POA: Diagnosis not present

## 2013-02-06 DIAGNOSIS — M4712 Other spondylosis with myelopathy, cervical region: Secondary | ICD-10-CM | POA: Diagnosis not present

## 2013-02-06 DIAGNOSIS — M9981 Other biomechanical lesions of cervical region: Secondary | ICD-10-CM | POA: Diagnosis not present

## 2013-02-06 DIAGNOSIS — M999 Biomechanical lesion, unspecified: Secondary | ICD-10-CM | POA: Diagnosis not present

## 2013-02-16 DIAGNOSIS — I10 Essential (primary) hypertension: Secondary | ICD-10-CM | POA: Diagnosis not present

## 2013-02-16 DIAGNOSIS — R5381 Other malaise: Secondary | ICD-10-CM | POA: Diagnosis not present

## 2013-02-20 DIAGNOSIS — E291 Testicular hypofunction: Secondary | ICD-10-CM | POA: Diagnosis not present

## 2013-02-20 DIAGNOSIS — G619 Inflammatory polyneuropathy, unspecified: Secondary | ICD-10-CM | POA: Diagnosis not present

## 2013-02-20 DIAGNOSIS — I10 Essential (primary) hypertension: Secondary | ICD-10-CM | POA: Diagnosis not present

## 2013-02-20 DIAGNOSIS — F329 Major depressive disorder, single episode, unspecified: Secondary | ICD-10-CM | POA: Diagnosis not present

## 2013-02-20 DIAGNOSIS — Z23 Encounter for immunization: Secondary | ICD-10-CM | POA: Diagnosis not present

## 2013-02-20 DIAGNOSIS — E119 Type 2 diabetes mellitus without complications: Secondary | ICD-10-CM | POA: Diagnosis not present

## 2013-02-21 DIAGNOSIS — M9981 Other biomechanical lesions of cervical region: Secondary | ICD-10-CM | POA: Diagnosis not present

## 2013-02-21 DIAGNOSIS — M5430 Sciatica, unspecified side: Secondary | ICD-10-CM | POA: Diagnosis not present

## 2013-02-21 DIAGNOSIS — M47817 Spondylosis without myelopathy or radiculopathy, lumbosacral region: Secondary | ICD-10-CM | POA: Diagnosis not present

## 2013-02-21 DIAGNOSIS — M546 Pain in thoracic spine: Secondary | ICD-10-CM | POA: Diagnosis not present

## 2013-02-21 DIAGNOSIS — M4712 Other spondylosis with myelopathy, cervical region: Secondary | ICD-10-CM | POA: Diagnosis not present

## 2013-02-21 DIAGNOSIS — M999 Biomechanical lesion, unspecified: Secondary | ICD-10-CM | POA: Diagnosis not present

## 2013-03-05 DIAGNOSIS — M47817 Spondylosis without myelopathy or radiculopathy, lumbosacral region: Secondary | ICD-10-CM | POA: Diagnosis not present

## 2013-03-05 DIAGNOSIS — M9981 Other biomechanical lesions of cervical region: Secondary | ICD-10-CM | POA: Diagnosis not present

## 2013-03-05 DIAGNOSIS — M546 Pain in thoracic spine: Secondary | ICD-10-CM | POA: Diagnosis not present

## 2013-03-05 DIAGNOSIS — M5430 Sciatica, unspecified side: Secondary | ICD-10-CM | POA: Diagnosis not present

## 2013-03-05 DIAGNOSIS — M999 Biomechanical lesion, unspecified: Secondary | ICD-10-CM | POA: Diagnosis not present

## 2013-03-05 DIAGNOSIS — M4712 Other spondylosis with myelopathy, cervical region: Secondary | ICD-10-CM | POA: Diagnosis not present

## 2013-03-19 DIAGNOSIS — M5430 Sciatica, unspecified side: Secondary | ICD-10-CM | POA: Diagnosis not present

## 2013-03-19 DIAGNOSIS — M546 Pain in thoracic spine: Secondary | ICD-10-CM | POA: Diagnosis not present

## 2013-03-19 DIAGNOSIS — M999 Biomechanical lesion, unspecified: Secondary | ICD-10-CM | POA: Diagnosis not present

## 2013-03-19 DIAGNOSIS — M4712 Other spondylosis with myelopathy, cervical region: Secondary | ICD-10-CM | POA: Diagnosis not present

## 2013-03-19 DIAGNOSIS — M9981 Other biomechanical lesions of cervical region: Secondary | ICD-10-CM | POA: Diagnosis not present

## 2013-03-19 DIAGNOSIS — M47817 Spondylosis without myelopathy or radiculopathy, lumbosacral region: Secondary | ICD-10-CM | POA: Diagnosis not present

## 2013-03-27 DIAGNOSIS — I10 Essential (primary) hypertension: Secondary | ICD-10-CM | POA: Diagnosis not present

## 2013-03-27 DIAGNOSIS — E291 Testicular hypofunction: Secondary | ICD-10-CM | POA: Diagnosis not present

## 2013-03-27 DIAGNOSIS — E119 Type 2 diabetes mellitus without complications: Secondary | ICD-10-CM | POA: Diagnosis not present

## 2013-04-02 DIAGNOSIS — I10 Essential (primary) hypertension: Secondary | ICD-10-CM | POA: Diagnosis not present

## 2013-04-02 DIAGNOSIS — E119 Type 2 diabetes mellitus without complications: Secondary | ICD-10-CM | POA: Diagnosis not present

## 2013-04-02 DIAGNOSIS — E291 Testicular hypofunction: Secondary | ICD-10-CM | POA: Diagnosis not present

## 2013-04-03 DIAGNOSIS — M47817 Spondylosis without myelopathy or radiculopathy, lumbosacral region: Secondary | ICD-10-CM | POA: Diagnosis not present

## 2013-04-03 DIAGNOSIS — M999 Biomechanical lesion, unspecified: Secondary | ICD-10-CM | POA: Diagnosis not present

## 2013-04-03 DIAGNOSIS — M546 Pain in thoracic spine: Secondary | ICD-10-CM | POA: Diagnosis not present

## 2013-04-03 DIAGNOSIS — M9981 Other biomechanical lesions of cervical region: Secondary | ICD-10-CM | POA: Diagnosis not present

## 2013-04-03 DIAGNOSIS — M4712 Other spondylosis with myelopathy, cervical region: Secondary | ICD-10-CM | POA: Diagnosis not present

## 2013-04-03 DIAGNOSIS — M5430 Sciatica, unspecified side: Secondary | ICD-10-CM | POA: Diagnosis not present

## 2013-04-17 DIAGNOSIS — M47817 Spondylosis without myelopathy or radiculopathy, lumbosacral region: Secondary | ICD-10-CM | POA: Diagnosis not present

## 2013-04-17 DIAGNOSIS — M546 Pain in thoracic spine: Secondary | ICD-10-CM | POA: Diagnosis not present

## 2013-04-17 DIAGNOSIS — M5430 Sciatica, unspecified side: Secondary | ICD-10-CM | POA: Diagnosis not present

## 2013-04-17 DIAGNOSIS — M4712 Other spondylosis with myelopathy, cervical region: Secondary | ICD-10-CM | POA: Diagnosis not present

## 2013-04-17 DIAGNOSIS — M999 Biomechanical lesion, unspecified: Secondary | ICD-10-CM | POA: Diagnosis not present

## 2013-04-17 DIAGNOSIS — M9981 Other biomechanical lesions of cervical region: Secondary | ICD-10-CM | POA: Diagnosis not present

## 2013-05-01 DIAGNOSIS — M4712 Other spondylosis with myelopathy, cervical region: Secondary | ICD-10-CM | POA: Diagnosis not present

## 2013-05-01 DIAGNOSIS — M47817 Spondylosis without myelopathy or radiculopathy, lumbosacral region: Secondary | ICD-10-CM | POA: Diagnosis not present

## 2013-05-01 DIAGNOSIS — M5430 Sciatica, unspecified side: Secondary | ICD-10-CM | POA: Diagnosis not present

## 2013-05-01 DIAGNOSIS — M999 Biomechanical lesion, unspecified: Secondary | ICD-10-CM | POA: Diagnosis not present

## 2013-05-01 DIAGNOSIS — M9981 Other biomechanical lesions of cervical region: Secondary | ICD-10-CM | POA: Diagnosis not present

## 2013-05-01 DIAGNOSIS — M546 Pain in thoracic spine: Secondary | ICD-10-CM | POA: Diagnosis not present

## 2013-05-14 DIAGNOSIS — L219 Seborrheic dermatitis, unspecified: Secondary | ICD-10-CM | POA: Diagnosis not present

## 2013-05-14 DIAGNOSIS — Z85828 Personal history of other malignant neoplasm of skin: Secondary | ICD-10-CM | POA: Diagnosis not present

## 2013-05-14 DIAGNOSIS — L570 Actinic keratosis: Secondary | ICD-10-CM | POA: Diagnosis not present

## 2013-05-21 DIAGNOSIS — E119 Type 2 diabetes mellitus without complications: Secondary | ICD-10-CM | POA: Diagnosis not present

## 2013-05-21 DIAGNOSIS — E1149 Type 2 diabetes mellitus with other diabetic neurological complication: Secondary | ICD-10-CM | POA: Diagnosis not present

## 2013-05-22 DIAGNOSIS — M999 Biomechanical lesion, unspecified: Secondary | ICD-10-CM | POA: Diagnosis not present

## 2013-05-22 DIAGNOSIS — M47817 Spondylosis without myelopathy or radiculopathy, lumbosacral region: Secondary | ICD-10-CM | POA: Diagnosis not present

## 2013-05-22 DIAGNOSIS — M546 Pain in thoracic spine: Secondary | ICD-10-CM | POA: Diagnosis not present

## 2013-05-22 DIAGNOSIS — M9981 Other biomechanical lesions of cervical region: Secondary | ICD-10-CM | POA: Diagnosis not present

## 2013-05-22 DIAGNOSIS — M4712 Other spondylosis with myelopathy, cervical region: Secondary | ICD-10-CM | POA: Diagnosis not present

## 2013-05-22 DIAGNOSIS — M5430 Sciatica, unspecified side: Secondary | ICD-10-CM | POA: Diagnosis not present

## 2013-06-08 DIAGNOSIS — M5430 Sciatica, unspecified side: Secondary | ICD-10-CM | POA: Diagnosis not present

## 2013-06-08 DIAGNOSIS — M4712 Other spondylosis with myelopathy, cervical region: Secondary | ICD-10-CM | POA: Diagnosis not present

## 2013-06-08 DIAGNOSIS — M546 Pain in thoracic spine: Secondary | ICD-10-CM | POA: Diagnosis not present

## 2013-06-08 DIAGNOSIS — M47817 Spondylosis without myelopathy or radiculopathy, lumbosacral region: Secondary | ICD-10-CM | POA: Diagnosis not present

## 2013-06-08 DIAGNOSIS — M999 Biomechanical lesion, unspecified: Secondary | ICD-10-CM | POA: Diagnosis not present

## 2013-06-08 DIAGNOSIS — M9981 Other biomechanical lesions of cervical region: Secondary | ICD-10-CM | POA: Diagnosis not present

## 2013-06-21 DIAGNOSIS — E291 Testicular hypofunction: Secondary | ICD-10-CM | POA: Diagnosis not present

## 2013-06-21 DIAGNOSIS — E119 Type 2 diabetes mellitus without complications: Secondary | ICD-10-CM | POA: Diagnosis not present

## 2013-06-21 DIAGNOSIS — R5381 Other malaise: Secondary | ICD-10-CM | POA: Diagnosis not present

## 2013-06-21 DIAGNOSIS — G619 Inflammatory polyneuropathy, unspecified: Secondary | ICD-10-CM | POA: Diagnosis not present

## 2013-06-21 DIAGNOSIS — I10 Essential (primary) hypertension: Secondary | ICD-10-CM | POA: Diagnosis not present

## 2013-06-25 DIAGNOSIS — M79609 Pain in unspecified limb: Secondary | ICD-10-CM | POA: Diagnosis not present

## 2013-06-25 DIAGNOSIS — L97509 Non-pressure chronic ulcer of other part of unspecified foot with unspecified severity: Secondary | ICD-10-CM | POA: Diagnosis not present

## 2013-06-29 DIAGNOSIS — M4712 Other spondylosis with myelopathy, cervical region: Secondary | ICD-10-CM | POA: Diagnosis not present

## 2013-06-29 DIAGNOSIS — M9981 Other biomechanical lesions of cervical region: Secondary | ICD-10-CM | POA: Diagnosis not present

## 2013-06-29 DIAGNOSIS — M47817 Spondylosis without myelopathy or radiculopathy, lumbosacral region: Secondary | ICD-10-CM | POA: Diagnosis not present

## 2013-06-29 DIAGNOSIS — M546 Pain in thoracic spine: Secondary | ICD-10-CM | POA: Diagnosis not present

## 2013-06-29 DIAGNOSIS — M999 Biomechanical lesion, unspecified: Secondary | ICD-10-CM | POA: Diagnosis not present

## 2013-06-29 DIAGNOSIS — M5430 Sciatica, unspecified side: Secondary | ICD-10-CM | POA: Diagnosis not present

## 2013-07-09 DIAGNOSIS — M79609 Pain in unspecified limb: Secondary | ICD-10-CM | POA: Diagnosis not present

## 2013-07-09 DIAGNOSIS — L97509 Non-pressure chronic ulcer of other part of unspecified foot with unspecified severity: Secondary | ICD-10-CM | POA: Diagnosis not present

## 2013-07-11 DIAGNOSIS — E1149 Type 2 diabetes mellitus with other diabetic neurological complication: Secondary | ICD-10-CM | POA: Diagnosis not present

## 2013-07-11 DIAGNOSIS — E7800 Pure hypercholesterolemia, unspecified: Secondary | ICD-10-CM | POA: Diagnosis not present

## 2013-07-11 DIAGNOSIS — E782 Mixed hyperlipidemia: Secondary | ICD-10-CM | POA: Diagnosis not present

## 2013-07-11 DIAGNOSIS — IMO0001 Reserved for inherently not codable concepts without codable children: Secondary | ICD-10-CM | POA: Diagnosis not present

## 2013-07-11 DIAGNOSIS — E785 Hyperlipidemia, unspecified: Secondary | ICD-10-CM | POA: Diagnosis not present

## 2013-07-18 DIAGNOSIS — IMO0001 Reserved for inherently not codable concepts without codable children: Secondary | ICD-10-CM | POA: Diagnosis not present

## 2013-07-18 DIAGNOSIS — G622 Polyneuropathy due to other toxic agents: Secondary | ICD-10-CM | POA: Diagnosis not present

## 2013-07-18 DIAGNOSIS — F3289 Other specified depressive episodes: Secondary | ICD-10-CM | POA: Diagnosis not present

## 2013-07-18 DIAGNOSIS — E7800 Pure hypercholesterolemia, unspecified: Secondary | ICD-10-CM | POA: Diagnosis not present

## 2013-07-18 DIAGNOSIS — F329 Major depressive disorder, single episode, unspecified: Secondary | ICD-10-CM | POA: Diagnosis not present

## 2013-07-18 DIAGNOSIS — G619 Inflammatory polyneuropathy, unspecified: Secondary | ICD-10-CM | POA: Diagnosis not present

## 2013-07-20 DIAGNOSIS — M5430 Sciatica, unspecified side: Secondary | ICD-10-CM | POA: Diagnosis not present

## 2013-07-20 DIAGNOSIS — M546 Pain in thoracic spine: Secondary | ICD-10-CM | POA: Diagnosis not present

## 2013-07-20 DIAGNOSIS — M47817 Spondylosis without myelopathy or radiculopathy, lumbosacral region: Secondary | ICD-10-CM | POA: Diagnosis not present

## 2013-07-20 DIAGNOSIS — M999 Biomechanical lesion, unspecified: Secondary | ICD-10-CM | POA: Diagnosis not present

## 2013-07-20 DIAGNOSIS — M9981 Other biomechanical lesions of cervical region: Secondary | ICD-10-CM | POA: Diagnosis not present

## 2013-07-20 DIAGNOSIS — M4712 Other spondylosis with myelopathy, cervical region: Secondary | ICD-10-CM | POA: Diagnosis not present

## 2013-07-23 DIAGNOSIS — M47817 Spondylosis without myelopathy or radiculopathy, lumbosacral region: Secondary | ICD-10-CM | POA: Diagnosis not present

## 2013-07-23 DIAGNOSIS — M4712 Other spondylosis with myelopathy, cervical region: Secondary | ICD-10-CM | POA: Diagnosis not present

## 2013-07-23 DIAGNOSIS — M9981 Other biomechanical lesions of cervical region: Secondary | ICD-10-CM | POA: Diagnosis not present

## 2013-07-23 DIAGNOSIS — M546 Pain in thoracic spine: Secondary | ICD-10-CM | POA: Diagnosis not present

## 2013-07-23 DIAGNOSIS — M999 Biomechanical lesion, unspecified: Secondary | ICD-10-CM | POA: Diagnosis not present

## 2013-07-23 DIAGNOSIS — M5430 Sciatica, unspecified side: Secondary | ICD-10-CM | POA: Diagnosis not present

## 2013-07-26 DIAGNOSIS — M9981 Other biomechanical lesions of cervical region: Secondary | ICD-10-CM | POA: Diagnosis not present

## 2013-07-26 DIAGNOSIS — M4712 Other spondylosis with myelopathy, cervical region: Secondary | ICD-10-CM | POA: Diagnosis not present

## 2013-07-26 DIAGNOSIS — M546 Pain in thoracic spine: Secondary | ICD-10-CM | POA: Diagnosis not present

## 2013-07-26 DIAGNOSIS — M5430 Sciatica, unspecified side: Secondary | ICD-10-CM | POA: Diagnosis not present

## 2013-07-26 DIAGNOSIS — M999 Biomechanical lesion, unspecified: Secondary | ICD-10-CM | POA: Diagnosis not present

## 2013-07-26 DIAGNOSIS — M47817 Spondylosis without myelopathy or radiculopathy, lumbosacral region: Secondary | ICD-10-CM | POA: Diagnosis not present

## 2013-07-30 DIAGNOSIS — L97509 Non-pressure chronic ulcer of other part of unspecified foot with unspecified severity: Secondary | ICD-10-CM | POA: Diagnosis not present

## 2013-07-30 DIAGNOSIS — M79609 Pain in unspecified limb: Secondary | ICD-10-CM | POA: Diagnosis not present

## 2013-07-31 DIAGNOSIS — M4712 Other spondylosis with myelopathy, cervical region: Secondary | ICD-10-CM | POA: Diagnosis not present

## 2013-07-31 DIAGNOSIS — M5430 Sciatica, unspecified side: Secondary | ICD-10-CM | POA: Diagnosis not present

## 2013-07-31 DIAGNOSIS — M47817 Spondylosis without myelopathy or radiculopathy, lumbosacral region: Secondary | ICD-10-CM | POA: Diagnosis not present

## 2013-07-31 DIAGNOSIS — M546 Pain in thoracic spine: Secondary | ICD-10-CM | POA: Diagnosis not present

## 2013-07-31 DIAGNOSIS — M9981 Other biomechanical lesions of cervical region: Secondary | ICD-10-CM | POA: Diagnosis not present

## 2013-07-31 DIAGNOSIS — M999 Biomechanical lesion, unspecified: Secondary | ICD-10-CM | POA: Diagnosis not present

## 2013-08-06 DIAGNOSIS — M546 Pain in thoracic spine: Secondary | ICD-10-CM | POA: Diagnosis not present

## 2013-08-06 DIAGNOSIS — M9981 Other biomechanical lesions of cervical region: Secondary | ICD-10-CM | POA: Diagnosis not present

## 2013-08-06 DIAGNOSIS — M47817 Spondylosis without myelopathy or radiculopathy, lumbosacral region: Secondary | ICD-10-CM | POA: Diagnosis not present

## 2013-08-06 DIAGNOSIS — M4712 Other spondylosis with myelopathy, cervical region: Secondary | ICD-10-CM | POA: Diagnosis not present

## 2013-08-06 DIAGNOSIS — M5430 Sciatica, unspecified side: Secondary | ICD-10-CM | POA: Diagnosis not present

## 2013-08-06 DIAGNOSIS — M999 Biomechanical lesion, unspecified: Secondary | ICD-10-CM | POA: Diagnosis not present

## 2013-08-13 DIAGNOSIS — M546 Pain in thoracic spine: Secondary | ICD-10-CM | POA: Diagnosis not present

## 2013-08-13 DIAGNOSIS — M47817 Spondylosis without myelopathy or radiculopathy, lumbosacral region: Secondary | ICD-10-CM | POA: Diagnosis not present

## 2013-08-13 DIAGNOSIS — M9981 Other biomechanical lesions of cervical region: Secondary | ICD-10-CM | POA: Diagnosis not present

## 2013-08-13 DIAGNOSIS — M5430 Sciatica, unspecified side: Secondary | ICD-10-CM | POA: Diagnosis not present

## 2013-08-13 DIAGNOSIS — M4712 Other spondylosis with myelopathy, cervical region: Secondary | ICD-10-CM | POA: Diagnosis not present

## 2013-08-13 DIAGNOSIS — M999 Biomechanical lesion, unspecified: Secondary | ICD-10-CM | POA: Diagnosis not present

## 2013-08-20 DIAGNOSIS — M4712 Other spondylosis with myelopathy, cervical region: Secondary | ICD-10-CM | POA: Diagnosis not present

## 2013-08-20 DIAGNOSIS — M5430 Sciatica, unspecified side: Secondary | ICD-10-CM | POA: Diagnosis not present

## 2013-08-20 DIAGNOSIS — M546 Pain in thoracic spine: Secondary | ICD-10-CM | POA: Diagnosis not present

## 2013-08-20 DIAGNOSIS — M999 Biomechanical lesion, unspecified: Secondary | ICD-10-CM | POA: Diagnosis not present

## 2013-08-20 DIAGNOSIS — M9981 Other biomechanical lesions of cervical region: Secondary | ICD-10-CM | POA: Diagnosis not present

## 2013-08-20 DIAGNOSIS — M47817 Spondylosis without myelopathy or radiculopathy, lumbosacral region: Secondary | ICD-10-CM | POA: Diagnosis not present

## 2013-09-04 DIAGNOSIS — M999 Biomechanical lesion, unspecified: Secondary | ICD-10-CM | POA: Diagnosis not present

## 2013-09-04 DIAGNOSIS — M546 Pain in thoracic spine: Secondary | ICD-10-CM | POA: Diagnosis not present

## 2013-09-04 DIAGNOSIS — M9981 Other biomechanical lesions of cervical region: Secondary | ICD-10-CM | POA: Diagnosis not present

## 2013-09-04 DIAGNOSIS — M47817 Spondylosis without myelopathy or radiculopathy, lumbosacral region: Secondary | ICD-10-CM | POA: Diagnosis not present

## 2013-09-04 DIAGNOSIS — M4712 Other spondylosis with myelopathy, cervical region: Secondary | ICD-10-CM | POA: Diagnosis not present

## 2013-09-04 DIAGNOSIS — M5430 Sciatica, unspecified side: Secondary | ICD-10-CM | POA: Diagnosis not present

## 2013-09-19 DIAGNOSIS — M546 Pain in thoracic spine: Secondary | ICD-10-CM | POA: Diagnosis not present

## 2013-09-19 DIAGNOSIS — M5430 Sciatica, unspecified side: Secondary | ICD-10-CM | POA: Diagnosis not present

## 2013-09-19 DIAGNOSIS — M4712 Other spondylosis with myelopathy, cervical region: Secondary | ICD-10-CM | POA: Diagnosis not present

## 2013-09-19 DIAGNOSIS — M9981 Other biomechanical lesions of cervical region: Secondary | ICD-10-CM | POA: Diagnosis not present

## 2013-09-19 DIAGNOSIS — M47817 Spondylosis without myelopathy or radiculopathy, lumbosacral region: Secondary | ICD-10-CM | POA: Diagnosis not present

## 2013-09-19 DIAGNOSIS — M999 Biomechanical lesion, unspecified: Secondary | ICD-10-CM | POA: Diagnosis not present

## 2013-10-08 DIAGNOSIS — E1149 Type 2 diabetes mellitus with other diabetic neurological complication: Secondary | ICD-10-CM | POA: Diagnosis not present

## 2013-10-08 DIAGNOSIS — E119 Type 2 diabetes mellitus without complications: Secondary | ICD-10-CM | POA: Diagnosis not present

## 2013-10-10 DIAGNOSIS — E7800 Pure hypercholesterolemia, unspecified: Secondary | ICD-10-CM | POA: Diagnosis not present

## 2013-10-10 DIAGNOSIS — R5381 Other malaise: Secondary | ICD-10-CM | POA: Diagnosis not present

## 2013-10-10 DIAGNOSIS — IMO0001 Reserved for inherently not codable concepts without codable children: Secondary | ICD-10-CM | POA: Diagnosis not present

## 2013-10-10 DIAGNOSIS — R5383 Other fatigue: Secondary | ICD-10-CM | POA: Diagnosis not present

## 2013-10-10 DIAGNOSIS — I10 Essential (primary) hypertension: Secondary | ICD-10-CM | POA: Diagnosis not present

## 2013-10-17 DIAGNOSIS — E781 Pure hyperglyceridemia: Secondary | ICD-10-CM | POA: Diagnosis not present

## 2013-10-17 DIAGNOSIS — E291 Testicular hypofunction: Secondary | ICD-10-CM | POA: Diagnosis not present

## 2013-10-17 DIAGNOSIS — M999 Biomechanical lesion, unspecified: Secondary | ICD-10-CM | POA: Diagnosis not present

## 2013-10-17 DIAGNOSIS — M546 Pain in thoracic spine: Secondary | ICD-10-CM | POA: Diagnosis not present

## 2013-10-17 DIAGNOSIS — G622 Polyneuropathy due to other toxic agents: Secondary | ICD-10-CM | POA: Diagnosis not present

## 2013-10-17 DIAGNOSIS — G619 Inflammatory polyneuropathy, unspecified: Secondary | ICD-10-CM | POA: Diagnosis not present

## 2013-10-17 DIAGNOSIS — M47817 Spondylosis without myelopathy or radiculopathy, lumbosacral region: Secondary | ICD-10-CM | POA: Diagnosis not present

## 2013-10-17 DIAGNOSIS — M4712 Other spondylosis with myelopathy, cervical region: Secondary | ICD-10-CM | POA: Diagnosis not present

## 2013-10-17 DIAGNOSIS — I10 Essential (primary) hypertension: Secondary | ICD-10-CM | POA: Diagnosis not present

## 2013-10-17 DIAGNOSIS — M5430 Sciatica, unspecified side: Secondary | ICD-10-CM | POA: Diagnosis not present

## 2013-10-17 DIAGNOSIS — E119 Type 2 diabetes mellitus without complications: Secondary | ICD-10-CM | POA: Diagnosis not present

## 2013-10-17 DIAGNOSIS — M9981 Other biomechanical lesions of cervical region: Secondary | ICD-10-CM | POA: Diagnosis not present

## 2013-10-18 DIAGNOSIS — I059 Rheumatic mitral valve disease, unspecified: Secondary | ICD-10-CM | POA: Diagnosis not present

## 2013-10-18 DIAGNOSIS — R011 Cardiac murmur, unspecified: Secondary | ICD-10-CM | POA: Diagnosis not present

## 2013-10-23 DIAGNOSIS — M546 Pain in thoracic spine: Secondary | ICD-10-CM | POA: Diagnosis not present

## 2013-10-23 DIAGNOSIS — M5430 Sciatica, unspecified side: Secondary | ICD-10-CM | POA: Diagnosis not present

## 2013-10-23 DIAGNOSIS — M47817 Spondylosis without myelopathy or radiculopathy, lumbosacral region: Secondary | ICD-10-CM | POA: Diagnosis not present

## 2013-10-23 DIAGNOSIS — M4712 Other spondylosis with myelopathy, cervical region: Secondary | ICD-10-CM | POA: Diagnosis not present

## 2013-10-23 DIAGNOSIS — M9981 Other biomechanical lesions of cervical region: Secondary | ICD-10-CM | POA: Diagnosis not present

## 2013-10-23 DIAGNOSIS — M999 Biomechanical lesion, unspecified: Secondary | ICD-10-CM | POA: Diagnosis not present

## 2013-10-30 DIAGNOSIS — M9981 Other biomechanical lesions of cervical region: Secondary | ICD-10-CM | POA: Diagnosis not present

## 2013-10-30 DIAGNOSIS — M4712 Other spondylosis with myelopathy, cervical region: Secondary | ICD-10-CM | POA: Diagnosis not present

## 2013-10-30 DIAGNOSIS — M999 Biomechanical lesion, unspecified: Secondary | ICD-10-CM | POA: Diagnosis not present

## 2013-10-30 DIAGNOSIS — M5430 Sciatica, unspecified side: Secondary | ICD-10-CM | POA: Diagnosis not present

## 2013-10-30 DIAGNOSIS — M47817 Spondylosis without myelopathy or radiculopathy, lumbosacral region: Secondary | ICD-10-CM | POA: Diagnosis not present

## 2013-10-30 DIAGNOSIS — M546 Pain in thoracic spine: Secondary | ICD-10-CM | POA: Diagnosis not present

## 2013-11-13 DIAGNOSIS — M47817 Spondylosis without myelopathy or radiculopathy, lumbosacral region: Secondary | ICD-10-CM | POA: Diagnosis not present

## 2013-11-13 DIAGNOSIS — M9981 Other biomechanical lesions of cervical region: Secondary | ICD-10-CM | POA: Diagnosis not present

## 2013-11-13 DIAGNOSIS — M5430 Sciatica, unspecified side: Secondary | ICD-10-CM | POA: Diagnosis not present

## 2013-11-13 DIAGNOSIS — M546 Pain in thoracic spine: Secondary | ICD-10-CM | POA: Diagnosis not present

## 2013-11-13 DIAGNOSIS — M999 Biomechanical lesion, unspecified: Secondary | ICD-10-CM | POA: Diagnosis not present

## 2013-11-13 DIAGNOSIS — M4712 Other spondylosis with myelopathy, cervical region: Secondary | ICD-10-CM | POA: Diagnosis not present

## 2013-11-19 DIAGNOSIS — L570 Actinic keratosis: Secondary | ICD-10-CM | POA: Diagnosis not present

## 2013-11-19 DIAGNOSIS — Z85828 Personal history of other malignant neoplasm of skin: Secondary | ICD-10-CM | POA: Diagnosis not present

## 2013-11-30 DIAGNOSIS — M546 Pain in thoracic spine: Secondary | ICD-10-CM | POA: Diagnosis not present

## 2013-11-30 DIAGNOSIS — M4712 Other spondylosis with myelopathy, cervical region: Secondary | ICD-10-CM | POA: Diagnosis not present

## 2013-11-30 DIAGNOSIS — M5430 Sciatica, unspecified side: Secondary | ICD-10-CM | POA: Diagnosis not present

## 2013-11-30 DIAGNOSIS — M47817 Spondylosis without myelopathy or radiculopathy, lumbosacral region: Secondary | ICD-10-CM | POA: Diagnosis not present

## 2013-11-30 DIAGNOSIS — M9981 Other biomechanical lesions of cervical region: Secondary | ICD-10-CM | POA: Diagnosis not present

## 2013-11-30 DIAGNOSIS — M999 Biomechanical lesion, unspecified: Secondary | ICD-10-CM | POA: Diagnosis not present

## 2013-12-18 DIAGNOSIS — M546 Pain in thoracic spine: Secondary | ICD-10-CM | POA: Diagnosis not present

## 2013-12-18 DIAGNOSIS — M47817 Spondylosis without myelopathy or radiculopathy, lumbosacral region: Secondary | ICD-10-CM | POA: Diagnosis not present

## 2013-12-18 DIAGNOSIS — M5430 Sciatica, unspecified side: Secondary | ICD-10-CM | POA: Diagnosis not present

## 2013-12-18 DIAGNOSIS — M4712 Other spondylosis with myelopathy, cervical region: Secondary | ICD-10-CM | POA: Diagnosis not present

## 2013-12-18 DIAGNOSIS — M9981 Other biomechanical lesions of cervical region: Secondary | ICD-10-CM | POA: Diagnosis not present

## 2013-12-18 DIAGNOSIS — M999 Biomechanical lesion, unspecified: Secondary | ICD-10-CM | POA: Diagnosis not present

## 2013-12-26 DIAGNOSIS — E1149 Type 2 diabetes mellitus with other diabetic neurological complication: Secondary | ICD-10-CM | POA: Diagnosis not present

## 2013-12-26 DIAGNOSIS — E119 Type 2 diabetes mellitus without complications: Secondary | ICD-10-CM | POA: Diagnosis not present

## 2014-01-01 DIAGNOSIS — M47817 Spondylosis without myelopathy or radiculopathy, lumbosacral region: Secondary | ICD-10-CM | POA: Diagnosis not present

## 2014-01-01 DIAGNOSIS — M999 Biomechanical lesion, unspecified: Secondary | ICD-10-CM | POA: Diagnosis not present

## 2014-01-01 DIAGNOSIS — M4712 Other spondylosis with myelopathy, cervical region: Secondary | ICD-10-CM | POA: Diagnosis not present

## 2014-01-01 DIAGNOSIS — M5430 Sciatica, unspecified side: Secondary | ICD-10-CM | POA: Diagnosis not present

## 2014-01-01 DIAGNOSIS — M546 Pain in thoracic spine: Secondary | ICD-10-CM | POA: Diagnosis not present

## 2014-01-01 DIAGNOSIS — M9981 Other biomechanical lesions of cervical region: Secondary | ICD-10-CM | POA: Diagnosis not present

## 2014-01-15 DIAGNOSIS — M47812 Spondylosis without myelopathy or radiculopathy, cervical region: Secondary | ICD-10-CM | POA: Diagnosis not present

## 2014-01-15 DIAGNOSIS — M9902 Segmental and somatic dysfunction of thoracic region: Secondary | ICD-10-CM | POA: Diagnosis not present

## 2014-01-15 DIAGNOSIS — M9901 Segmental and somatic dysfunction of cervical region: Secondary | ICD-10-CM | POA: Diagnosis not present

## 2014-01-15 DIAGNOSIS — M4004 Postural kyphosis, thoracic region: Secondary | ICD-10-CM | POA: Diagnosis not present

## 2014-01-15 DIAGNOSIS — M47816 Spondylosis without myelopathy or radiculopathy, lumbar region: Secondary | ICD-10-CM | POA: Diagnosis not present

## 2014-01-15 DIAGNOSIS — M9903 Segmental and somatic dysfunction of lumbar region: Secondary | ICD-10-CM | POA: Diagnosis not present

## 2014-01-29 DIAGNOSIS — M47812 Spondylosis without myelopathy or radiculopathy, cervical region: Secondary | ICD-10-CM | POA: Diagnosis not present

## 2014-01-29 DIAGNOSIS — M9903 Segmental and somatic dysfunction of lumbar region: Secondary | ICD-10-CM | POA: Diagnosis not present

## 2014-01-29 DIAGNOSIS — M9901 Segmental and somatic dysfunction of cervical region: Secondary | ICD-10-CM | POA: Diagnosis not present

## 2014-01-29 DIAGNOSIS — M4004 Postural kyphosis, thoracic region: Secondary | ICD-10-CM | POA: Diagnosis not present

## 2014-01-29 DIAGNOSIS — M47816 Spondylosis without myelopathy or radiculopathy, lumbar region: Secondary | ICD-10-CM | POA: Diagnosis not present

## 2014-01-29 DIAGNOSIS — M9902 Segmental and somatic dysfunction of thoracic region: Secondary | ICD-10-CM | POA: Diagnosis not present

## 2014-02-12 DIAGNOSIS — M47816 Spondylosis without myelopathy or radiculopathy, lumbar region: Secondary | ICD-10-CM | POA: Diagnosis not present

## 2014-02-12 DIAGNOSIS — M9901 Segmental and somatic dysfunction of cervical region: Secondary | ICD-10-CM | POA: Diagnosis not present

## 2014-02-12 DIAGNOSIS — M9902 Segmental and somatic dysfunction of thoracic region: Secondary | ICD-10-CM | POA: Diagnosis not present

## 2014-02-12 DIAGNOSIS — M9903 Segmental and somatic dysfunction of lumbar region: Secondary | ICD-10-CM | POA: Diagnosis not present

## 2014-02-12 DIAGNOSIS — M47812 Spondylosis without myelopathy or radiculopathy, cervical region: Secondary | ICD-10-CM | POA: Diagnosis not present

## 2014-02-12 DIAGNOSIS — M4004 Postural kyphosis, thoracic region: Secondary | ICD-10-CM | POA: Diagnosis not present

## 2014-03-21 DIAGNOSIS — E11331 Type 2 diabetes mellitus with moderate nonproliferative diabetic retinopathy with macular edema: Secondary | ICD-10-CM | POA: Diagnosis not present

## 2014-03-27 DIAGNOSIS — M79671 Pain in right foot: Secondary | ICD-10-CM | POA: Diagnosis not present

## 2014-03-27 DIAGNOSIS — L89893 Pressure ulcer of other site, stage 3: Secondary | ICD-10-CM | POA: Diagnosis not present

## 2014-03-29 DIAGNOSIS — E119 Type 2 diabetes mellitus without complications: Secondary | ICD-10-CM | POA: Diagnosis not present

## 2014-03-29 DIAGNOSIS — E780 Pure hypercholesterolemia: Secondary | ICD-10-CM | POA: Diagnosis not present

## 2014-03-29 DIAGNOSIS — I10 Essential (primary) hypertension: Secondary | ICD-10-CM | POA: Diagnosis not present

## 2014-04-19 DIAGNOSIS — Z1389 Encounter for screening for other disorder: Secondary | ICD-10-CM | POA: Diagnosis not present

## 2014-04-19 DIAGNOSIS — Z23 Encounter for immunization: Secondary | ICD-10-CM | POA: Diagnosis not present

## 2014-04-19 DIAGNOSIS — E11618 Type 2 diabetes mellitus with other diabetic arthropathy: Secondary | ICD-10-CM | POA: Diagnosis not present

## 2014-04-19 DIAGNOSIS — E1143 Type 2 diabetes mellitus with diabetic autonomic (poly)neuropathy: Secondary | ICD-10-CM | POA: Diagnosis not present

## 2014-05-08 DIAGNOSIS — L89892 Pressure ulcer of other site, stage 2: Secondary | ICD-10-CM | POA: Diagnosis not present

## 2014-05-08 DIAGNOSIS — E1140 Type 2 diabetes mellitus with diabetic neuropathy, unspecified: Secondary | ICD-10-CM | POA: Diagnosis not present

## 2014-05-08 DIAGNOSIS — M79671 Pain in right foot: Secondary | ICD-10-CM | POA: Diagnosis not present

## 2014-05-13 ENCOUNTER — Encounter: Payer: Self-pay | Admitting: Neurology

## 2014-05-13 DIAGNOSIS — Z85828 Personal history of other malignant neoplasm of skin: Secondary | ICD-10-CM | POA: Diagnosis not present

## 2014-05-13 DIAGNOSIS — L219 Seborrheic dermatitis, unspecified: Secondary | ICD-10-CM | POA: Diagnosis not present

## 2014-05-13 DIAGNOSIS — L570 Actinic keratosis: Secondary | ICD-10-CM | POA: Diagnosis not present

## 2014-05-14 ENCOUNTER — Ambulatory Visit (INDEPENDENT_AMBULATORY_CARE_PROVIDER_SITE_OTHER): Payer: Medicare Other | Admitting: Neurology

## 2014-05-14 ENCOUNTER — Encounter: Payer: Self-pay | Admitting: Neurology

## 2014-05-14 VITALS — BP 120/64 | HR 54 | Temp 97.3°F | Ht 71.0 in | Wt 229.0 lb

## 2014-05-14 DIAGNOSIS — E669 Obesity, unspecified: Secondary | ICD-10-CM | POA: Diagnosis not present

## 2014-05-14 DIAGNOSIS — G250 Essential tremor: Secondary | ICD-10-CM

## 2014-05-14 DIAGNOSIS — L97519 Non-pressure chronic ulcer of other part of right foot with unspecified severity: Secondary | ICD-10-CM

## 2014-05-14 DIAGNOSIS — E1140 Type 2 diabetes mellitus with diabetic neuropathy, unspecified: Secondary | ICD-10-CM | POA: Diagnosis not present

## 2014-05-14 NOTE — Progress Notes (Signed)
Subjective:    Patient ID: Larry Hart is a 69 y.o. male.  HPI    Star Age, MD, PhD [facility] [address] [place], [place] [address]  Dear Dr. Nadara Mustard,  I saw your patient, Larry Hart, upon your kind request in my neurologic clinic today for initial consultation of his diabetic neuropathy as well as concern for parkinsonism. The patient is accompanied by his wife today. As you know, Mr. Larry Hart is a 69 year old right-handed gentleman with an underlying medical history of hyperlipidemia, depression, diabetes, hypertension, hypothyroidism, and obesity, who reports an over 10 year history of b/l hand tremors, with action and posture. He also has painful diabetic neuropathy for over 2 decades. He was diagnosed with diabetes over 29 years ago. He does not smoke or drink alcohol. His latest hemoglobin A1c was around 6.4 he states. He sees endocrinologist for this. He was diagnosed with diabetic neuropathy many years ago and an EMG nerve conduction study was attempted several years ago as I understand that he could not go through the EMG portion and his wife adds that he does not like needles. He has been taking gabapentin 600 mg strength 4-6 pills each night. One time he took 14 pills at night altogether. He has foot pain at night particularly on the left side. He is currently seeing a podiatrist for an ulcer of his right big toe. He has lost 2 of his toes on the left foot secondary to cellulitis he states. He has a boot on his right foot at this time. He can put pressure on his right foot. His numbness and foot pain affects both lower extremities up to the knees. His tremor is mildly bothersome to him. His father had a tremor and his paternal grandfather also had a tremor. His handwriting has become worse but his wife adds that he has never had a good handwriting.  His Past Medical History Is Significant For: Past Medical History  Diagnosis Date  .  Diabetes mellitus   . Hypertension   . Dyslipidemia   . Peripheral vascular disease   . Colon polyp     Status post hemicolectomy  . Osteomyelitis     Status post amputation  . Dyspnea on exertion   . Sinus bradycardia   . Cancer     skin  . Thyroid disorder     low  . Low testosterone   . Low serum triglycerides     His Past Surgical History Is Significant For: Past Surgical History  Procedure Laterality Date  . Amputation      x5  . Hemicolectomy    . Other surgical history      gastroinstestinal surgery    His Family History Is Significant For: Family History  Problem Relation Age of Onset  . Diabetes    . Hypertension Mother   . Coronary artery disease Father 81  . Stroke Mother     His Social History Is Significant For: History   Social History  . Marital Status: Married    Spouse Name: N/A    Number of Children: N/A  . Years of Education: N/A   Social History Main Topics  . Smoking status: Never Smoker   . Smokeless tobacco: Never Used  . Alcohol Use: No  . Drug Use: No  . Sexual Activity: None   Other Topics Concern  . None   Social History Narrative   Consumes 3 cups of coffee daily  His Allergies Are:  Allergies  Allergen Reactions  . Honey Bee Treatment [Bee Venom]   . Other     Histalet, tears up nerves,possible rash  :   His Current Medications Are:  Outpatient Encounter Prescriptions as of 05/14/2014  Medication Sig  . Ascorbic Acid (VITAMIN C) 1000 MG tablet Take 1,000 mg by mouth daily.  Marland Kitchen aspirin 81 MG tablet Take 81 mg by mouth 2 (two) times daily.   . Cholecalciferol (VITAMIN D3) 2000 UNITS TABS Take by mouth daily.  . cloNIDine (CATAPRES) 0.2 MG tablet Take 0.2 mg by mouth daily.  . Ergocalciferol (VITAMIN D2 PO) Take by mouth daily.  Marland Kitchen escitalopram (LEXAPRO) 20 MG tablet Take 20 mg by mouth daily.  . fenofibrate 160 MG tablet Take 160 mg by mouth daily.  Marland Kitchen gabapentin (NEURONTIN) 600 MG tablet Take 1,200 mg by mouth 6  (six) times daily.   . Garlic 123XX123 MG CAPS Take 1,000 mg by mouth daily.  . hydrochlorothiazide (HYDRODIURIL) 25 MG tablet Take 25 mg by mouth daily.  . insulin glargine (LANTUS) 100 UNIT/ML injection Inject 60 Units into the skin at bedtime.  . insulin lispro (HUMALOG) 100 UNIT/ML injection Inject into the skin 3 (three) times daily before meals.  Marland Kitchen ketoconazole (NIZORAL) 2 % cream   . levothyroxine (SYNTHROID, LEVOTHROID) 50 MCG tablet Take 50 mcg by mouth daily before breakfast.  . lisinopril (PRINIVIL,ZESTRIL) 40 MG tablet   . METOPROLOL SUCCINATE ER PO Take 50 mg by mouth daily.  . Multiple Minerals-Vitamins (CALCIUM CITRATE PLUS/MAGNESIUM PO) Take 500 mg by mouth daily.  . Multiple Vitamin (MULTIVITAMIN) capsule Take 1 capsule by mouth daily. Adults 50+  . NITROSTAT 0.4 MG SL tablet Place 0.4 mg under the tongue every 5 (five) minutes as needed.   Marland Kitchen omega-3 acid ethyl esters (LOVAZA) 1 G capsule Take 2 g by mouth 2 (two) times daily.  . potassium gluconate 595 MG TABS tablet Take 595 mg by mouth daily.  . Zinc 50 MG TABS Take by mouth daily.  Marland Kitchen Zn-Pyg Afri-Nettle-Saw Palmet (SAW PALMETTO COMPLEX PO) Take 1 capsule by mouth daily.  . [DISCONTINUED] amLODipine (NORVASC) 10 MG tablet Take 10 mg by mouth daily.  . [DISCONTINUED] cetirizine (ZYRTEC) 10 MG tablet Take 10 mg by mouth daily.  . [DISCONTINUED] Chromium-Cinnamon (CINNAMON PLUS CHROMIUM PO) Take 500 mg by mouth daily.  . [DISCONTINUED] clonazePAM (KLONOPIN) 0.5 MG tablet Take 0.25 mg by mouth 3 (three) times daily.  . [DISCONTINUED] Evening Primrose topical oil Apply 1,300 mg topically daily.  . [DISCONTINUED] HAWTHORN PO Take by mouth.  . [DISCONTINUED] lisinopril (PRINIVIL,ZESTRIL) 10 MG tablet Take 10 mg by mouth daily.  . [DISCONTINUED] simvastatin (ZOCOR) 40 MG tablet Take 40 mg by mouth daily.  . [DISCONTINUED] Testosterone 30 MG/ACT SOLN Place onto the skin. 1 spray TD, QAM  . [DISCONTINUED] Ubiquinol 200 MG CAPS Take 200  mg by mouth daily.  :   Review of Systems:  Out of a complete 14 point review of systems, all are reviewed and negative with the exception of these symptoms as listed below:   Review of Systems  Constitutional: Positive for fatigue.       Weight gain  HENT: Positive for hearing loss.        Ringing in ears  Respiratory: Positive for shortness of breath.        Snoring  Genitourinary:       Impotence  Allergic/Immunologic:       Allergies, runny nose  Neurological: Positive for tremors, weakness and headaches.       Memory loss, sleepiness, restless legs  Psychiatric/Behavioral:       Not enough sleep, decreased energy    Objective:  Neurologic Exam  Physical Exam Physical Examination:   Filed Vitals:   05/14/14 1327  BP: 120/64  Pulse: 54  Temp: 97.3 F (36.3 C)    General Examination: The patient is a very pleasant 69 y.o. male in no acute distress. He appears well-developed and well-nourished and well groomed.   HEENT: Normocephalic, atraumatic, pupils are equal, round and reactive to light and accommodation. Funduscopic exam is normal with sharp disc margins noted. Extraocular tracking is good without limitation to gaze excursion or nystagmus noted. Normal smooth pursuit is noted. Hearing is grossly intact. Tympanic membranes are clear bilaterally. Face is symmetric with normal facial animation and normal facial sensation. Speech is clear with no dysarthria noted. There is no hypophonia. There is no lip, neck/head, jaw or voice tremor. Neck is supple with full range of passive and active motion. There are no carotid bruits on auscultation. Oropharynx exam reveals: moderate mouth dryness, adequate dental hygiene and moderate airway crowding, due to redundant soft palate and larger tongue. Mallampati is class III. Tongue protrudes centrally and palate elevates symmetrically.  Chest: Clear to auscultation without wheezing, rhonchi or crackles noted.  Heart: S1+S2+0,  regular and normal with a faint systolic murmur, no rubs or gallops noted.   Abdomen: Soft, non-tender and non-distended with normal bowel sounds appreciated on auscultation.  Extremities: There is 2+ pitting edema in the distal lower extremities bilaterally.  Skin: Warm and dry without trophic changes noted. There are no varicose veins. He has chronic appearing discoloration of his distal lower extremities.  Musculoskeletal: exam reveals no obvious joint deformities, tenderness or joint swelling or erythema.   Neurologically:  Mental status: The patient is awake, alert and oriented in all 4 spheres. His immediate and remote memory, attention, language skills and fund of knowledge are appropriate. There is no evidence of aphasia, agnosia, apraxia or anomia. Speech is clear with normal prosody and enunciation. Thought process is linear. Mood is normal and affect is normal.  Cranial nerves II - XII are as described above under HEENT exam. In addition: shoulder shrug is normal with equal shoulder height noted. Motor exam: Normal bulk, strength and tone is noted. There is no drift, tremor or rebound. Romberg is positive. Reflexes are 1+ in the UEs and absent in both LEs.  Fine motor skills are mildly impaired. Finger taps showed no decrement in amplitude.  He has no resting tremor. He has a bilateral upper extremity mild postural and action tremor. On Archimedes spiral drawing he has mild tremulousness, worse on the left. His handwriting is not micrographic and mildly tremulous but legible.   Cerebellar testing: No dysmetria or intention tremor on finger to nose testing. Heel to shin is difficult bilaterally. There is no truncal ataxia.  Sensory exam: Decreased to light touch, pinprick, vibration, temperature sense in the upper extremities up to the mid forearms and in the lower extremities up to both knees.   Gait, station and balance: He stands with difficulty and has to push himself up. He walks  slowly and cautiously. He walks slightly wide-based. He turns in 3 steps. Tandem walk is not possible for him. He does have preserved arm swing.  Assessment and plan:   In summary, Michai Dilpreet Modesto is a very pleasant 69 y.o.-year old male with an underlying  medical history of hyperlipidemia, depression, diabetes, hypertension, hypothyroidism, and obesity, who reports an over 10 year history of b/l hand tremors, with action and posture. He also has painful diabetic neuropathy for over 2 decades.  His physical exam is in keeping with essential tremor, affecting both upper extremities. I do not see any evidence of parkinsonism at this time. He was reassured in that regard. He also has evidence of painful diabetic neuropathy for which she is on gabapentin high-dose. I discouraged him to take it all at once. This can be very taxing for his kidneys and quite sedating. It may help to break it up throughout the day and 3 doses. I encouraged him to try to take it throughout the day and not all at once. He is strongly discouraged to take more than he is prescribed. I did not suggest any new medications but we did talk about symptomatic treatment of essential tremor. My reservation is that something like Mysoline could be quite sedating. He is already on multiple medications. He is not keen on starting another symptomatic medication at this time. At this juncture, I have advised him to follow-up with me on an as-needed basis. If his tremor becomes bothersome to him we can certainly pick up our discussion about symptomatic treatments. Certainly, at this time he does not have any evidence of parkinsonism. He has a family history of tremors which is also in keeping with essential tremor. I answered all their questions today and the patient and his wife were in agreement with the above outlined plan. Thank you very much for allowing me to participate in the care of this nice patient. If I can be of any further assistance to  you please do not hesitate to call me at [phone].  Sincerely,   Star Age, MD, PhD

## 2014-05-14 NOTE — Patient Instructions (Addendum)
You have evidence of diabetic neuropathy. I do not suggest any new medication, but would recommend, that you divide your gabapentin in 3 doses throughout the day.  You have mild tremor, not parkinson's but in keeping with what we call essential tremor.   Please remember, that any kind of tremor may be exacerbated by anxiety, anger, nervousness, excitement, dehydration, sleep deprivation, by caffeine, and low blood sugar values or blood sugar fluctuations. Some medications, especially some antidepressants and lithium can cause or exacerbate tremors. Tremors may temporarily calm down her subside with the use of a benzodiazepine such as Valium or related medications and with alcohol. Be aware however that drinking alcohol is not an approved treatment or appropriate treatment for tremor control and long-term use of benzodiazepines such as Valium, lorazepam, alprazolam, or clonazepam can cause habit formation, physical and psychological addiction.  I suggest I see you back as needed at this junction.

## 2014-06-05 DIAGNOSIS — B351 Tinea unguium: Secondary | ICD-10-CM | POA: Diagnosis not present

## 2014-06-05 DIAGNOSIS — E1140 Type 2 diabetes mellitus with diabetic neuropathy, unspecified: Secondary | ICD-10-CM | POA: Diagnosis not present

## 2014-06-05 DIAGNOSIS — E1151 Type 2 diabetes mellitus with diabetic peripheral angiopathy without gangrene: Secondary | ICD-10-CM | POA: Diagnosis not present

## 2014-06-05 DIAGNOSIS — L110 Acquired keratosis follicularis: Secondary | ICD-10-CM | POA: Diagnosis not present

## 2014-06-26 DIAGNOSIS — Z794 Long term (current) use of insulin: Secondary | ICD-10-CM | POA: Diagnosis not present

## 2014-06-26 DIAGNOSIS — G4733 Obstructive sleep apnea (adult) (pediatric): Secondary | ICD-10-CM | POA: Diagnosis not present

## 2014-06-26 DIAGNOSIS — I10 Essential (primary) hypertension: Secondary | ICD-10-CM | POA: Diagnosis not present

## 2014-06-26 DIAGNOSIS — E119 Type 2 diabetes mellitus without complications: Secondary | ICD-10-CM | POA: Diagnosis not present

## 2014-07-02 DIAGNOSIS — G479 Sleep disorder, unspecified: Secondary | ICD-10-CM | POA: Diagnosis not present

## 2014-07-05 DIAGNOSIS — G4733 Obstructive sleep apnea (adult) (pediatric): Secondary | ICD-10-CM | POA: Diagnosis not present

## 2014-07-23 DIAGNOSIS — E1165 Type 2 diabetes mellitus with hyperglycemia: Secondary | ICD-10-CM | POA: Diagnosis not present

## 2014-07-23 DIAGNOSIS — E785 Hyperlipidemia, unspecified: Secondary | ICD-10-CM | POA: Diagnosis not present

## 2014-07-23 DIAGNOSIS — E780 Pure hypercholesterolemia: Secondary | ICD-10-CM | POA: Diagnosis not present

## 2014-07-24 DIAGNOSIS — G4733 Obstructive sleep apnea (adult) (pediatric): Secondary | ICD-10-CM | POA: Diagnosis not present

## 2014-07-24 DIAGNOSIS — G4710 Hypersomnia, unspecified: Secondary | ICD-10-CM | POA: Diagnosis not present

## 2014-07-24 DIAGNOSIS — I10 Essential (primary) hypertension: Secondary | ICD-10-CM | POA: Diagnosis not present

## 2014-07-31 DIAGNOSIS — E1140 Type 2 diabetes mellitus with diabetic neuropathy, unspecified: Secondary | ICD-10-CM | POA: Diagnosis not present

## 2014-07-31 DIAGNOSIS — L89892 Pressure ulcer of other site, stage 2: Secondary | ICD-10-CM | POA: Diagnosis not present

## 2014-07-31 DIAGNOSIS — M79671 Pain in right foot: Secondary | ICD-10-CM | POA: Diagnosis not present

## 2014-08-01 DIAGNOSIS — M792 Neuralgia and neuritis, unspecified: Secondary | ICD-10-CM | POA: Diagnosis not present

## 2014-08-01 DIAGNOSIS — E1140 Type 2 diabetes mellitus with diabetic neuropathy, unspecified: Secondary | ICD-10-CM | POA: Diagnosis not present

## 2014-08-01 DIAGNOSIS — G5790 Unspecified mononeuropathy of unspecified lower limb: Secondary | ICD-10-CM | POA: Diagnosis not present

## 2014-08-01 DIAGNOSIS — G600 Hereditary motor and sensory neuropathy: Secondary | ICD-10-CM | POA: Diagnosis not present

## 2014-08-06 DIAGNOSIS — E1140 Type 2 diabetes mellitus with diabetic neuropathy, unspecified: Secondary | ICD-10-CM | POA: Diagnosis not present

## 2014-08-06 DIAGNOSIS — G600 Hereditary motor and sensory neuropathy: Secondary | ICD-10-CM | POA: Diagnosis not present

## 2014-08-06 DIAGNOSIS — M792 Neuralgia and neuritis, unspecified: Secondary | ICD-10-CM | POA: Diagnosis not present

## 2014-08-06 DIAGNOSIS — G4733 Obstructive sleep apnea (adult) (pediatric): Secondary | ICD-10-CM | POA: Diagnosis not present

## 2014-08-06 DIAGNOSIS — G5790 Unspecified mononeuropathy of unspecified lower limb: Secondary | ICD-10-CM | POA: Diagnosis not present

## 2014-08-08 DIAGNOSIS — G600 Hereditary motor and sensory neuropathy: Secondary | ICD-10-CM | POA: Diagnosis not present

## 2014-08-08 DIAGNOSIS — G5790 Unspecified mononeuropathy of unspecified lower limb: Secondary | ICD-10-CM | POA: Diagnosis not present

## 2014-08-08 DIAGNOSIS — E1140 Type 2 diabetes mellitus with diabetic neuropathy, unspecified: Secondary | ICD-10-CM | POA: Diagnosis not present

## 2014-08-08 DIAGNOSIS — M792 Neuralgia and neuritis, unspecified: Secondary | ICD-10-CM | POA: Diagnosis not present

## 2014-08-10 DIAGNOSIS — G4733 Obstructive sleep apnea (adult) (pediatric): Secondary | ICD-10-CM | POA: Diagnosis not present

## 2014-08-11 DIAGNOSIS — E119 Type 2 diabetes mellitus without complications: Secondary | ICD-10-CM | POA: Diagnosis not present

## 2014-08-11 DIAGNOSIS — Z8249 Family history of ischemic heart disease and other diseases of the circulatory system: Secondary | ICD-10-CM | POA: Diagnosis not present

## 2014-08-11 DIAGNOSIS — Z7982 Long term (current) use of aspirin: Secondary | ICD-10-CM | POA: Diagnosis not present

## 2014-08-11 DIAGNOSIS — R55 Syncope and collapse: Secondary | ICD-10-CM | POA: Diagnosis not present

## 2014-08-11 DIAGNOSIS — Z833 Family history of diabetes mellitus: Secondary | ICD-10-CM | POA: Diagnosis not present

## 2014-08-11 DIAGNOSIS — S0990XA Unspecified injury of head, initial encounter: Secondary | ICD-10-CM | POA: Diagnosis not present

## 2014-08-11 DIAGNOSIS — Z79899 Other long term (current) drug therapy: Secondary | ICD-10-CM | POA: Diagnosis not present

## 2014-08-11 DIAGNOSIS — G909 Disorder of the autonomic nervous system, unspecified: Secondary | ICD-10-CM | POA: Diagnosis not present

## 2014-08-11 DIAGNOSIS — Z794 Long term (current) use of insulin: Secondary | ICD-10-CM | POA: Diagnosis not present

## 2014-08-11 DIAGNOSIS — I10 Essential (primary) hypertension: Secondary | ICD-10-CM | POA: Diagnosis not present

## 2014-08-13 DIAGNOSIS — E1143 Type 2 diabetes mellitus with diabetic autonomic (poly)neuropathy: Secondary | ICD-10-CM | POA: Diagnosis not present

## 2014-08-13 DIAGNOSIS — E1165 Type 2 diabetes mellitus with hyperglycemia: Secondary | ICD-10-CM | POA: Diagnosis not present

## 2014-08-13 DIAGNOSIS — I10 Essential (primary) hypertension: Secondary | ICD-10-CM | POA: Diagnosis not present

## 2014-08-13 DIAGNOSIS — F328 Other depressive episodes: Secondary | ICD-10-CM | POA: Diagnosis not present

## 2014-08-13 DIAGNOSIS — E780 Pure hypercholesterolemia: Secondary | ICD-10-CM | POA: Diagnosis not present

## 2014-08-13 DIAGNOSIS — R55 Syncope and collapse: Secondary | ICD-10-CM | POA: Diagnosis not present

## 2014-08-14 DIAGNOSIS — E1140 Type 2 diabetes mellitus with diabetic neuropathy, unspecified: Secondary | ICD-10-CM | POA: Diagnosis not present

## 2014-08-14 DIAGNOSIS — G5790 Unspecified mononeuropathy of unspecified lower limb: Secondary | ICD-10-CM | POA: Diagnosis not present

## 2014-08-14 DIAGNOSIS — G600 Hereditary motor and sensory neuropathy: Secondary | ICD-10-CM | POA: Diagnosis not present

## 2014-08-14 DIAGNOSIS — M792 Neuralgia and neuritis, unspecified: Secondary | ICD-10-CM | POA: Diagnosis not present

## 2014-08-19 DIAGNOSIS — G5790 Unspecified mononeuropathy of unspecified lower limb: Secondary | ICD-10-CM | POA: Diagnosis not present

## 2014-08-19 DIAGNOSIS — G600 Hereditary motor and sensory neuropathy: Secondary | ICD-10-CM | POA: Diagnosis not present

## 2014-08-19 DIAGNOSIS — E1140 Type 2 diabetes mellitus with diabetic neuropathy, unspecified: Secondary | ICD-10-CM | POA: Diagnosis not present

## 2014-08-19 DIAGNOSIS — M792 Neuralgia and neuritis, unspecified: Secondary | ICD-10-CM | POA: Diagnosis not present

## 2014-08-21 DIAGNOSIS — L110 Acquired keratosis follicularis: Secondary | ICD-10-CM | POA: Diagnosis not present

## 2014-08-21 DIAGNOSIS — L89893 Pressure ulcer of other site, stage 3: Secondary | ICD-10-CM | POA: Diagnosis not present

## 2014-08-21 DIAGNOSIS — L609 Nail disorder, unspecified: Secondary | ICD-10-CM | POA: Diagnosis not present

## 2014-08-21 DIAGNOSIS — E1140 Type 2 diabetes mellitus with diabetic neuropathy, unspecified: Secondary | ICD-10-CM | POA: Diagnosis not present

## 2014-08-22 DIAGNOSIS — G5790 Unspecified mononeuropathy of unspecified lower limb: Secondary | ICD-10-CM | POA: Diagnosis not present

## 2014-08-22 DIAGNOSIS — G600 Hereditary motor and sensory neuropathy: Secondary | ICD-10-CM | POA: Diagnosis not present

## 2014-08-22 DIAGNOSIS — E1140 Type 2 diabetes mellitus with diabetic neuropathy, unspecified: Secondary | ICD-10-CM | POA: Diagnosis not present

## 2014-08-22 DIAGNOSIS — M792 Neuralgia and neuritis, unspecified: Secondary | ICD-10-CM | POA: Diagnosis not present

## 2014-08-26 DIAGNOSIS — G600 Hereditary motor and sensory neuropathy: Secondary | ICD-10-CM | POA: Diagnosis not present

## 2014-08-26 DIAGNOSIS — E1140 Type 2 diabetes mellitus with diabetic neuropathy, unspecified: Secondary | ICD-10-CM | POA: Diagnosis not present

## 2014-08-26 DIAGNOSIS — M792 Neuralgia and neuritis, unspecified: Secondary | ICD-10-CM | POA: Diagnosis not present

## 2014-08-26 DIAGNOSIS — G5790 Unspecified mononeuropathy of unspecified lower limb: Secondary | ICD-10-CM | POA: Diagnosis not present

## 2014-08-28 DIAGNOSIS — M792 Neuralgia and neuritis, unspecified: Secondary | ICD-10-CM | POA: Diagnosis not present

## 2014-08-28 DIAGNOSIS — G5790 Unspecified mononeuropathy of unspecified lower limb: Secondary | ICD-10-CM | POA: Diagnosis not present

## 2014-08-28 DIAGNOSIS — E1140 Type 2 diabetes mellitus with diabetic neuropathy, unspecified: Secondary | ICD-10-CM | POA: Diagnosis not present

## 2014-08-28 DIAGNOSIS — G600 Hereditary motor and sensory neuropathy: Secondary | ICD-10-CM | POA: Diagnosis not present

## 2014-08-29 DIAGNOSIS — M79672 Pain in left foot: Secondary | ICD-10-CM | POA: Diagnosis not present

## 2014-08-29 DIAGNOSIS — R600 Localized edema: Secondary | ICD-10-CM | POA: Diagnosis not present

## 2014-09-02 DIAGNOSIS — G600 Hereditary motor and sensory neuropathy: Secondary | ICD-10-CM | POA: Diagnosis not present

## 2014-09-02 DIAGNOSIS — E1140 Type 2 diabetes mellitus with diabetic neuropathy, unspecified: Secondary | ICD-10-CM | POA: Diagnosis not present

## 2014-09-02 DIAGNOSIS — G5790 Unspecified mononeuropathy of unspecified lower limb: Secondary | ICD-10-CM | POA: Diagnosis not present

## 2014-09-02 DIAGNOSIS — M792 Neuralgia and neuritis, unspecified: Secondary | ICD-10-CM | POA: Diagnosis not present

## 2014-09-04 DIAGNOSIS — M79671 Pain in right foot: Secondary | ICD-10-CM | POA: Diagnosis not present

## 2014-09-04 DIAGNOSIS — L89893 Pressure ulcer of other site, stage 3: Secondary | ICD-10-CM | POA: Diagnosis not present

## 2014-09-05 DIAGNOSIS — M792 Neuralgia and neuritis, unspecified: Secondary | ICD-10-CM | POA: Diagnosis not present

## 2014-09-05 DIAGNOSIS — G600 Hereditary motor and sensory neuropathy: Secondary | ICD-10-CM | POA: Diagnosis not present

## 2014-09-05 DIAGNOSIS — E1140 Type 2 diabetes mellitus with diabetic neuropathy, unspecified: Secondary | ICD-10-CM | POA: Diagnosis not present

## 2014-09-05 DIAGNOSIS — G5790 Unspecified mononeuropathy of unspecified lower limb: Secondary | ICD-10-CM | POA: Diagnosis not present

## 2014-09-12 DIAGNOSIS — G5790 Unspecified mononeuropathy of unspecified lower limb: Secondary | ICD-10-CM | POA: Diagnosis not present

## 2014-09-12 DIAGNOSIS — G600 Hereditary motor and sensory neuropathy: Secondary | ICD-10-CM | POA: Diagnosis not present

## 2014-09-12 DIAGNOSIS — M792 Neuralgia and neuritis, unspecified: Secondary | ICD-10-CM | POA: Diagnosis not present

## 2014-09-12 DIAGNOSIS — E1140 Type 2 diabetes mellitus with diabetic neuropathy, unspecified: Secondary | ICD-10-CM | POA: Diagnosis not present

## 2014-09-23 DIAGNOSIS — M792 Neuralgia and neuritis, unspecified: Secondary | ICD-10-CM | POA: Diagnosis not present

## 2014-09-23 DIAGNOSIS — G5790 Unspecified mononeuropathy of unspecified lower limb: Secondary | ICD-10-CM | POA: Diagnosis not present

## 2014-09-23 DIAGNOSIS — E1140 Type 2 diabetes mellitus with diabetic neuropathy, unspecified: Secondary | ICD-10-CM | POA: Diagnosis not present

## 2014-09-23 DIAGNOSIS — G600 Hereditary motor and sensory neuropathy: Secondary | ICD-10-CM | POA: Diagnosis not present

## 2014-10-03 DIAGNOSIS — L89893 Pressure ulcer of other site, stage 3: Secondary | ICD-10-CM | POA: Diagnosis not present

## 2014-10-03 DIAGNOSIS — E780 Pure hypercholesterolemia: Secondary | ICD-10-CM | POA: Diagnosis not present

## 2014-10-03 DIAGNOSIS — E1143 Type 2 diabetes mellitus with diabetic autonomic (poly)neuropathy: Secondary | ICD-10-CM | POA: Diagnosis not present

## 2014-10-03 DIAGNOSIS — D519 Vitamin B12 deficiency anemia, unspecified: Secondary | ICD-10-CM | POA: Diagnosis not present

## 2014-10-03 DIAGNOSIS — I10 Essential (primary) hypertension: Secondary | ICD-10-CM | POA: Diagnosis not present

## 2014-10-03 DIAGNOSIS — M79671 Pain in right foot: Secondary | ICD-10-CM | POA: Diagnosis not present

## 2014-10-04 DIAGNOSIS — E780 Pure hypercholesterolemia: Secondary | ICD-10-CM | POA: Diagnosis not present

## 2014-10-07 DIAGNOSIS — M9901 Segmental and somatic dysfunction of cervical region: Secondary | ICD-10-CM | POA: Diagnosis not present

## 2014-10-07 DIAGNOSIS — M9902 Segmental and somatic dysfunction of thoracic region: Secondary | ICD-10-CM | POA: Diagnosis not present

## 2014-10-07 DIAGNOSIS — M47812 Spondylosis without myelopathy or radiculopathy, cervical region: Secondary | ICD-10-CM | POA: Diagnosis not present

## 2014-10-07 DIAGNOSIS — M47816 Spondylosis without myelopathy or radiculopathy, lumbar region: Secondary | ICD-10-CM | POA: Diagnosis not present

## 2014-10-07 DIAGNOSIS — J019 Acute sinusitis, unspecified: Secondary | ICD-10-CM | POA: Diagnosis not present

## 2014-10-07 DIAGNOSIS — M9903 Segmental and somatic dysfunction of lumbar region: Secondary | ICD-10-CM | POA: Diagnosis not present

## 2014-10-07 DIAGNOSIS — M546 Pain in thoracic spine: Secondary | ICD-10-CM | POA: Diagnosis not present

## 2014-10-07 DIAGNOSIS — M545 Low back pain: Secondary | ICD-10-CM | POA: Diagnosis not present

## 2014-10-07 DIAGNOSIS — R51 Headache: Secondary | ICD-10-CM | POA: Diagnosis not present

## 2014-10-10 DIAGNOSIS — M9902 Segmental and somatic dysfunction of thoracic region: Secondary | ICD-10-CM | POA: Diagnosis not present

## 2014-10-10 DIAGNOSIS — M546 Pain in thoracic spine: Secondary | ICD-10-CM | POA: Diagnosis not present

## 2014-10-10 DIAGNOSIS — M545 Low back pain: Secondary | ICD-10-CM | POA: Diagnosis not present

## 2014-10-10 DIAGNOSIS — M9901 Segmental and somatic dysfunction of cervical region: Secondary | ICD-10-CM | POA: Diagnosis not present

## 2014-10-10 DIAGNOSIS — M47816 Spondylosis without myelopathy or radiculopathy, lumbar region: Secondary | ICD-10-CM | POA: Diagnosis not present

## 2014-10-10 DIAGNOSIS — M47812 Spondylosis without myelopathy or radiculopathy, cervical region: Secondary | ICD-10-CM | POA: Diagnosis not present

## 2014-10-10 DIAGNOSIS — M4004 Postural kyphosis, thoracic region: Secondary | ICD-10-CM | POA: Diagnosis not present

## 2014-10-10 DIAGNOSIS — M5136 Other intervertebral disc degeneration, lumbar region: Secondary | ICD-10-CM | POA: Diagnosis not present

## 2014-10-10 DIAGNOSIS — M9903 Segmental and somatic dysfunction of lumbar region: Secondary | ICD-10-CM | POA: Diagnosis not present

## 2014-10-18 DIAGNOSIS — E1165 Type 2 diabetes mellitus with hyperglycemia: Secondary | ICD-10-CM | POA: Diagnosis not present

## 2014-10-18 DIAGNOSIS — M5136 Other intervertebral disc degeneration, lumbar region: Secondary | ICD-10-CM | POA: Diagnosis not present

## 2014-10-18 DIAGNOSIS — E11618 Type 2 diabetes mellitus with other diabetic arthropathy: Secondary | ICD-10-CM | POA: Diagnosis not present

## 2014-10-18 DIAGNOSIS — R55 Syncope and collapse: Secondary | ICD-10-CM | POA: Diagnosis not present

## 2014-10-18 DIAGNOSIS — E1143 Type 2 diabetes mellitus with diabetic autonomic (poly)neuropathy: Secondary | ICD-10-CM | POA: Diagnosis not present

## 2014-10-18 DIAGNOSIS — I10 Essential (primary) hypertension: Secondary | ICD-10-CM | POA: Diagnosis not present

## 2014-10-18 DIAGNOSIS — M47816 Spondylosis without myelopathy or radiculopathy, lumbar region: Secondary | ICD-10-CM | POA: Diagnosis not present

## 2014-10-18 DIAGNOSIS — M9903 Segmental and somatic dysfunction of lumbar region: Secondary | ICD-10-CM | POA: Diagnosis not present

## 2014-10-18 DIAGNOSIS — M47812 Spondylosis without myelopathy or radiculopathy, cervical region: Secondary | ICD-10-CM | POA: Diagnosis not present

## 2014-10-18 DIAGNOSIS — M4004 Postural kyphosis, thoracic region: Secondary | ICD-10-CM | POA: Diagnosis not present

## 2014-10-18 DIAGNOSIS — M9901 Segmental and somatic dysfunction of cervical region: Secondary | ICD-10-CM | POA: Diagnosis not present

## 2014-10-18 DIAGNOSIS — M545 Low back pain: Secondary | ICD-10-CM | POA: Diagnosis not present

## 2014-10-18 DIAGNOSIS — M546 Pain in thoracic spine: Secondary | ICD-10-CM | POA: Diagnosis not present

## 2014-10-18 DIAGNOSIS — M9902 Segmental and somatic dysfunction of thoracic region: Secondary | ICD-10-CM | POA: Diagnosis not present

## 2014-10-18 DIAGNOSIS — E291 Testicular hypofunction: Secondary | ICD-10-CM | POA: Diagnosis not present

## 2014-10-22 DIAGNOSIS — E1165 Type 2 diabetes mellitus with hyperglycemia: Secondary | ICD-10-CM | POA: Diagnosis not present

## 2014-10-22 DIAGNOSIS — E039 Hypothyroidism, unspecified: Secondary | ICD-10-CM | POA: Diagnosis not present

## 2014-10-23 DIAGNOSIS — R42 Dizziness and giddiness: Secondary | ICD-10-CM | POA: Diagnosis not present

## 2014-10-23 DIAGNOSIS — R55 Syncope and collapse: Secondary | ICD-10-CM | POA: Diagnosis not present

## 2014-10-24 DIAGNOSIS — L89892 Pressure ulcer of other site, stage 2: Secondary | ICD-10-CM | POA: Diagnosis not present

## 2014-10-24 DIAGNOSIS — R011 Cardiac murmur, unspecified: Secondary | ICD-10-CM | POA: Diagnosis not present

## 2014-10-24 DIAGNOSIS — M4004 Postural kyphosis, thoracic region: Secondary | ICD-10-CM | POA: Diagnosis not present

## 2014-10-24 DIAGNOSIS — S335XXA Sprain of ligaments of lumbar spine, initial encounter: Secondary | ICD-10-CM | POA: Diagnosis not present

## 2014-10-24 DIAGNOSIS — R55 Syncope and collapse: Secondary | ICD-10-CM | POA: Diagnosis not present

## 2014-10-24 DIAGNOSIS — M9903 Segmental and somatic dysfunction of lumbar region: Secondary | ICD-10-CM | POA: Diagnosis not present

## 2014-10-24 DIAGNOSIS — E1151 Type 2 diabetes mellitus with diabetic peripheral angiopathy without gangrene: Secondary | ICD-10-CM | POA: Diagnosis not present

## 2014-10-24 DIAGNOSIS — I350 Nonrheumatic aortic (valve) stenosis: Secondary | ICD-10-CM | POA: Diagnosis not present

## 2014-10-24 DIAGNOSIS — M9902 Segmental and somatic dysfunction of thoracic region: Secondary | ICD-10-CM | POA: Diagnosis not present

## 2014-10-24 DIAGNOSIS — M9901 Segmental and somatic dysfunction of cervical region: Secondary | ICD-10-CM | POA: Diagnosis not present

## 2014-10-24 DIAGNOSIS — E1140 Type 2 diabetes mellitus with diabetic neuropathy, unspecified: Secondary | ICD-10-CM | POA: Diagnosis not present

## 2014-10-24 DIAGNOSIS — M5136 Other intervertebral disc degeneration, lumbar region: Secondary | ICD-10-CM | POA: Diagnosis not present

## 2014-10-24 DIAGNOSIS — I517 Cardiomegaly: Secondary | ICD-10-CM | POA: Diagnosis not present

## 2014-10-24 DIAGNOSIS — M47816 Spondylosis without myelopathy or radiculopathy, lumbar region: Secondary | ICD-10-CM | POA: Diagnosis not present

## 2014-10-24 DIAGNOSIS — M47812 Spondylosis without myelopathy or radiculopathy, cervical region: Secondary | ICD-10-CM | POA: Diagnosis not present

## 2014-10-24 DIAGNOSIS — M545 Low back pain: Secondary | ICD-10-CM | POA: Diagnosis not present

## 2014-10-24 DIAGNOSIS — M546 Pain in thoracic spine: Secondary | ICD-10-CM | POA: Diagnosis not present

## 2014-10-31 DIAGNOSIS — M9901 Segmental and somatic dysfunction of cervical region: Secondary | ICD-10-CM | POA: Diagnosis not present

## 2014-10-31 DIAGNOSIS — M9902 Segmental and somatic dysfunction of thoracic region: Secondary | ICD-10-CM | POA: Diagnosis not present

## 2014-10-31 DIAGNOSIS — M9903 Segmental and somatic dysfunction of lumbar region: Secondary | ICD-10-CM | POA: Diagnosis not present

## 2014-10-31 DIAGNOSIS — M4004 Postural kyphosis, thoracic region: Secondary | ICD-10-CM | POA: Diagnosis not present

## 2014-10-31 DIAGNOSIS — M47812 Spondylosis without myelopathy or radiculopathy, cervical region: Secondary | ICD-10-CM | POA: Diagnosis not present

## 2014-10-31 DIAGNOSIS — S335XXA Sprain of ligaments of lumbar spine, initial encounter: Secondary | ICD-10-CM | POA: Diagnosis not present

## 2014-10-31 DIAGNOSIS — M545 Low back pain: Secondary | ICD-10-CM | POA: Diagnosis not present

## 2014-10-31 DIAGNOSIS — M5136 Other intervertebral disc degeneration, lumbar region: Secondary | ICD-10-CM | POA: Diagnosis not present

## 2014-10-31 DIAGNOSIS — M546 Pain in thoracic spine: Secondary | ICD-10-CM | POA: Diagnosis not present

## 2014-10-31 DIAGNOSIS — M47816 Spondylosis without myelopathy or radiculopathy, lumbar region: Secondary | ICD-10-CM | POA: Diagnosis not present

## 2014-11-06 DIAGNOSIS — E1140 Type 2 diabetes mellitus with diabetic neuropathy, unspecified: Secondary | ICD-10-CM | POA: Diagnosis not present

## 2014-11-06 DIAGNOSIS — L89892 Pressure ulcer of other site, stage 2: Secondary | ICD-10-CM | POA: Diagnosis not present

## 2014-11-07 DIAGNOSIS — M5136 Other intervertebral disc degeneration, lumbar region: Secondary | ICD-10-CM | POA: Diagnosis not present

## 2014-11-07 DIAGNOSIS — M47816 Spondylosis without myelopathy or radiculopathy, lumbar region: Secondary | ICD-10-CM | POA: Diagnosis not present

## 2014-11-07 DIAGNOSIS — S335XXA Sprain of ligaments of lumbar spine, initial encounter: Secondary | ICD-10-CM | POA: Diagnosis not present

## 2014-11-07 DIAGNOSIS — M9901 Segmental and somatic dysfunction of cervical region: Secondary | ICD-10-CM | POA: Diagnosis not present

## 2014-11-07 DIAGNOSIS — M47812 Spondylosis without myelopathy or radiculopathy, cervical region: Secondary | ICD-10-CM | POA: Diagnosis not present

## 2014-11-07 DIAGNOSIS — M9902 Segmental and somatic dysfunction of thoracic region: Secondary | ICD-10-CM | POA: Diagnosis not present

## 2014-11-07 DIAGNOSIS — M546 Pain in thoracic spine: Secondary | ICD-10-CM | POA: Diagnosis not present

## 2014-11-07 DIAGNOSIS — M9903 Segmental and somatic dysfunction of lumbar region: Secondary | ICD-10-CM | POA: Diagnosis not present

## 2014-11-07 DIAGNOSIS — M4004 Postural kyphosis, thoracic region: Secondary | ICD-10-CM | POA: Diagnosis not present

## 2014-11-07 DIAGNOSIS — M545 Low back pain: Secondary | ICD-10-CM | POA: Diagnosis not present

## 2014-11-11 DIAGNOSIS — L570 Actinic keratosis: Secondary | ICD-10-CM | POA: Diagnosis not present

## 2014-11-11 DIAGNOSIS — L219 Seborrheic dermatitis, unspecified: Secondary | ICD-10-CM | POA: Diagnosis not present

## 2014-11-11 DIAGNOSIS — Z85828 Personal history of other malignant neoplasm of skin: Secondary | ICD-10-CM | POA: Diagnosis not present

## 2014-11-14 DIAGNOSIS — M47812 Spondylosis without myelopathy or radiculopathy, cervical region: Secondary | ICD-10-CM | POA: Diagnosis not present

## 2014-11-14 DIAGNOSIS — S335XXA Sprain of ligaments of lumbar spine, initial encounter: Secondary | ICD-10-CM | POA: Diagnosis not present

## 2014-11-14 DIAGNOSIS — M545 Low back pain: Secondary | ICD-10-CM | POA: Diagnosis not present

## 2014-11-14 DIAGNOSIS — M47816 Spondylosis without myelopathy or radiculopathy, lumbar region: Secondary | ICD-10-CM | POA: Diagnosis not present

## 2014-11-14 DIAGNOSIS — M9901 Segmental and somatic dysfunction of cervical region: Secondary | ICD-10-CM | POA: Diagnosis not present

## 2014-11-14 DIAGNOSIS — M4004 Postural kyphosis, thoracic region: Secondary | ICD-10-CM | POA: Diagnosis not present

## 2014-11-14 DIAGNOSIS — M9902 Segmental and somatic dysfunction of thoracic region: Secondary | ICD-10-CM | POA: Diagnosis not present

## 2014-11-14 DIAGNOSIS — M5136 Other intervertebral disc degeneration, lumbar region: Secondary | ICD-10-CM | POA: Diagnosis not present

## 2014-11-14 DIAGNOSIS — M546 Pain in thoracic spine: Secondary | ICD-10-CM | POA: Diagnosis not present

## 2014-11-14 DIAGNOSIS — M9903 Segmental and somatic dysfunction of lumbar region: Secondary | ICD-10-CM | POA: Diagnosis not present

## 2014-11-21 DIAGNOSIS — M9903 Segmental and somatic dysfunction of lumbar region: Secondary | ICD-10-CM | POA: Diagnosis not present

## 2014-11-21 DIAGNOSIS — M47816 Spondylosis without myelopathy or radiculopathy, lumbar region: Secondary | ICD-10-CM | POA: Diagnosis not present

## 2014-11-21 DIAGNOSIS — M47812 Spondylosis without myelopathy or radiculopathy, cervical region: Secondary | ICD-10-CM | POA: Diagnosis not present

## 2014-11-21 DIAGNOSIS — S335XXA Sprain of ligaments of lumbar spine, initial encounter: Secondary | ICD-10-CM | POA: Diagnosis not present

## 2014-11-21 DIAGNOSIS — M4004 Postural kyphosis, thoracic region: Secondary | ICD-10-CM | POA: Diagnosis not present

## 2014-11-21 DIAGNOSIS — M5136 Other intervertebral disc degeneration, lumbar region: Secondary | ICD-10-CM | POA: Diagnosis not present

## 2014-11-21 DIAGNOSIS — M9901 Segmental and somatic dysfunction of cervical region: Secondary | ICD-10-CM | POA: Diagnosis not present

## 2014-11-21 DIAGNOSIS — M546 Pain in thoracic spine: Secondary | ICD-10-CM | POA: Diagnosis not present

## 2014-11-21 DIAGNOSIS — M9902 Segmental and somatic dysfunction of thoracic region: Secondary | ICD-10-CM | POA: Diagnosis not present

## 2014-11-21 DIAGNOSIS — M545 Low back pain: Secondary | ICD-10-CM | POA: Diagnosis not present

## 2014-11-27 DIAGNOSIS — R42 Dizziness and giddiness: Secondary | ICD-10-CM | POA: Diagnosis not present

## 2014-11-27 DIAGNOSIS — E1140 Type 2 diabetes mellitus with diabetic neuropathy, unspecified: Secondary | ICD-10-CM | POA: Diagnosis not present

## 2014-11-27 DIAGNOSIS — L89892 Pressure ulcer of other site, stage 2: Secondary | ICD-10-CM | POA: Diagnosis not present

## 2014-11-28 DIAGNOSIS — M9902 Segmental and somatic dysfunction of thoracic region: Secondary | ICD-10-CM | POA: Diagnosis not present

## 2014-11-28 DIAGNOSIS — M47812 Spondylosis without myelopathy or radiculopathy, cervical region: Secondary | ICD-10-CM | POA: Diagnosis not present

## 2014-11-28 DIAGNOSIS — M545 Low back pain: Secondary | ICD-10-CM | POA: Diagnosis not present

## 2014-11-28 DIAGNOSIS — S335XXA Sprain of ligaments of lumbar spine, initial encounter: Secondary | ICD-10-CM | POA: Diagnosis not present

## 2014-11-28 DIAGNOSIS — M4004 Postural kyphosis, thoracic region: Secondary | ICD-10-CM | POA: Diagnosis not present

## 2014-11-28 DIAGNOSIS — M47816 Spondylosis without myelopathy or radiculopathy, lumbar region: Secondary | ICD-10-CM | POA: Diagnosis not present

## 2014-11-28 DIAGNOSIS — M9903 Segmental and somatic dysfunction of lumbar region: Secondary | ICD-10-CM | POA: Diagnosis not present

## 2014-11-28 DIAGNOSIS — M5136 Other intervertebral disc degeneration, lumbar region: Secondary | ICD-10-CM | POA: Diagnosis not present

## 2014-11-28 DIAGNOSIS — M9901 Segmental and somatic dysfunction of cervical region: Secondary | ICD-10-CM | POA: Diagnosis not present

## 2014-11-28 DIAGNOSIS — M546 Pain in thoracic spine: Secondary | ICD-10-CM | POA: Diagnosis not present

## 2014-12-05 DIAGNOSIS — E785 Hyperlipidemia, unspecified: Secondary | ICD-10-CM | POA: Diagnosis not present

## 2014-12-05 DIAGNOSIS — M9903 Segmental and somatic dysfunction of lumbar region: Secondary | ICD-10-CM | POA: Diagnosis not present

## 2014-12-05 DIAGNOSIS — M546 Pain in thoracic spine: Secondary | ICD-10-CM | POA: Diagnosis not present

## 2014-12-05 DIAGNOSIS — M47816 Spondylosis without myelopathy or radiculopathy, lumbar region: Secondary | ICD-10-CM | POA: Diagnosis not present

## 2014-12-05 DIAGNOSIS — M4004 Postural kyphosis, thoracic region: Secondary | ICD-10-CM | POA: Diagnosis not present

## 2014-12-05 DIAGNOSIS — R42 Dizziness and giddiness: Secondary | ICD-10-CM | POA: Diagnosis not present

## 2014-12-05 DIAGNOSIS — M47812 Spondylosis without myelopathy or radiculopathy, cervical region: Secondary | ICD-10-CM | POA: Diagnosis not present

## 2014-12-05 DIAGNOSIS — M545 Low back pain: Secondary | ICD-10-CM | POA: Diagnosis not present

## 2014-12-05 DIAGNOSIS — M9902 Segmental and somatic dysfunction of thoracic region: Secondary | ICD-10-CM | POA: Diagnosis not present

## 2014-12-05 DIAGNOSIS — I10 Essential (primary) hypertension: Secondary | ICD-10-CM | POA: Diagnosis not present

## 2014-12-05 DIAGNOSIS — M9901 Segmental and somatic dysfunction of cervical region: Secondary | ICD-10-CM | POA: Diagnosis not present

## 2014-12-05 DIAGNOSIS — S335XXA Sprain of ligaments of lumbar spine, initial encounter: Secondary | ICD-10-CM | POA: Diagnosis not present

## 2014-12-05 DIAGNOSIS — M5136 Other intervertebral disc degeneration, lumbar region: Secondary | ICD-10-CM | POA: Diagnosis not present

## 2014-12-11 DIAGNOSIS — I350 Nonrheumatic aortic (valve) stenosis: Secondary | ICD-10-CM | POA: Diagnosis not present

## 2014-12-11 DIAGNOSIS — I25119 Atherosclerotic heart disease of native coronary artery with unspecified angina pectoris: Secondary | ICD-10-CM | POA: Diagnosis not present

## 2014-12-18 DIAGNOSIS — S335XXA Sprain of ligaments of lumbar spine, initial encounter: Secondary | ICD-10-CM | POA: Diagnosis not present

## 2014-12-18 DIAGNOSIS — M545 Low back pain: Secondary | ICD-10-CM | POA: Diagnosis not present

## 2014-12-18 DIAGNOSIS — M4004 Postural kyphosis, thoracic region: Secondary | ICD-10-CM | POA: Diagnosis not present

## 2014-12-18 DIAGNOSIS — M546 Pain in thoracic spine: Secondary | ICD-10-CM | POA: Diagnosis not present

## 2014-12-18 DIAGNOSIS — M9903 Segmental and somatic dysfunction of lumbar region: Secondary | ICD-10-CM | POA: Diagnosis not present

## 2014-12-18 DIAGNOSIS — M5136 Other intervertebral disc degeneration, lumbar region: Secondary | ICD-10-CM | POA: Diagnosis not present

## 2014-12-18 DIAGNOSIS — M9901 Segmental and somatic dysfunction of cervical region: Secondary | ICD-10-CM | POA: Diagnosis not present

## 2014-12-18 DIAGNOSIS — M47816 Spondylosis without myelopathy or radiculopathy, lumbar region: Secondary | ICD-10-CM | POA: Diagnosis not present

## 2014-12-18 DIAGNOSIS — M47812 Spondylosis without myelopathy or radiculopathy, cervical region: Secondary | ICD-10-CM | POA: Diagnosis not present

## 2014-12-18 DIAGNOSIS — E1140 Type 2 diabetes mellitus with diabetic neuropathy, unspecified: Secondary | ICD-10-CM | POA: Diagnosis not present

## 2014-12-18 DIAGNOSIS — M9902 Segmental and somatic dysfunction of thoracic region: Secondary | ICD-10-CM | POA: Diagnosis not present

## 2014-12-18 DIAGNOSIS — L89893 Pressure ulcer of other site, stage 3: Secondary | ICD-10-CM | POA: Diagnosis not present

## 2014-12-19 DIAGNOSIS — I2510 Atherosclerotic heart disease of native coronary artery without angina pectoris: Secondary | ICD-10-CM | POA: Diagnosis not present

## 2015-01-03 DIAGNOSIS — I2510 Atherosclerotic heart disease of native coronary artery without angina pectoris: Secondary | ICD-10-CM | POA: Diagnosis not present

## 2015-01-03 DIAGNOSIS — R197 Diarrhea, unspecified: Secondary | ICD-10-CM | POA: Diagnosis not present

## 2015-01-03 DIAGNOSIS — E669 Obesity, unspecified: Secondary | ICD-10-CM | POA: Diagnosis not present

## 2015-01-03 DIAGNOSIS — N179 Acute kidney failure, unspecified: Secondary | ICD-10-CM | POA: Diagnosis not present

## 2015-01-03 DIAGNOSIS — K219 Gastro-esophageal reflux disease without esophagitis: Secondary | ICD-10-CM | POA: Diagnosis not present

## 2015-01-03 DIAGNOSIS — E785 Hyperlipidemia, unspecified: Secondary | ICD-10-CM | POA: Diagnosis not present

## 2015-01-03 DIAGNOSIS — R0602 Shortness of breath: Secondary | ICD-10-CM | POA: Diagnosis not present

## 2015-01-03 DIAGNOSIS — I350 Nonrheumatic aortic (valve) stenosis: Secondary | ICD-10-CM | POA: Diagnosis not present

## 2015-01-03 DIAGNOSIS — I6523 Occlusion and stenosis of bilateral carotid arteries: Secondary | ICD-10-CM | POA: Diagnosis not present

## 2015-01-03 DIAGNOSIS — Z01818 Encounter for other preprocedural examination: Secondary | ICD-10-CM | POA: Diagnosis not present

## 2015-01-03 DIAGNOSIS — I10 Essential (primary) hypertension: Secondary | ICD-10-CM | POA: Diagnosis not present

## 2015-01-03 DIAGNOSIS — E1165 Type 2 diabetes mellitus with hyperglycemia: Secondary | ICD-10-CM | POA: Diagnosis not present

## 2015-01-03 DIAGNOSIS — I25119 Atherosclerotic heart disease of native coronary artery with unspecified angina pectoris: Secondary | ICD-10-CM | POA: Diagnosis not present

## 2015-01-03 DIAGNOSIS — E11649 Type 2 diabetes mellitus with hypoglycemia without coma: Secondary | ICD-10-CM | POA: Diagnosis not present

## 2015-01-03 DIAGNOSIS — R001 Bradycardia, unspecified: Secondary | ICD-10-CM | POA: Diagnosis not present

## 2015-01-03 DIAGNOSIS — Z6833 Body mass index (BMI) 33.0-33.9, adult: Secondary | ICD-10-CM | POA: Diagnosis not present

## 2015-01-04 DIAGNOSIS — M76892 Other specified enthesopathies of left lower limb, excluding foot: Secondary | ICD-10-CM | POA: Diagnosis not present

## 2015-01-04 DIAGNOSIS — M7989 Other specified soft tissue disorders: Secondary | ICD-10-CM | POA: Diagnosis not present

## 2015-01-04 DIAGNOSIS — M7602 Gluteal tendinitis, left hip: Secondary | ICD-10-CM | POA: Diagnosis not present

## 2015-01-04 DIAGNOSIS — Z79899 Other long term (current) drug therapy: Secondary | ICD-10-CM | POA: Diagnosis not present

## 2015-01-04 DIAGNOSIS — Z794 Long term (current) use of insulin: Secondary | ICD-10-CM | POA: Diagnosis not present

## 2015-01-04 DIAGNOSIS — E119 Type 2 diabetes mellitus without complications: Secondary | ICD-10-CM | POA: Diagnosis not present

## 2015-01-04 DIAGNOSIS — Z7982 Long term (current) use of aspirin: Secondary | ICD-10-CM | POA: Diagnosis not present

## 2015-01-04 DIAGNOSIS — I10 Essential (primary) hypertension: Secondary | ICD-10-CM | POA: Diagnosis not present

## 2015-01-06 DIAGNOSIS — I350 Nonrheumatic aortic (valve) stenosis: Secondary | ICD-10-CM | POA: Diagnosis present

## 2015-01-06 DIAGNOSIS — J9811 Atelectasis: Secondary | ICD-10-CM | POA: Diagnosis not present

## 2015-01-06 DIAGNOSIS — E118 Type 2 diabetes mellitus with unspecified complications: Secondary | ICD-10-CM | POA: Diagnosis not present

## 2015-01-06 DIAGNOSIS — R001 Bradycardia, unspecified: Secondary | ICD-10-CM | POA: Diagnosis not present

## 2015-01-06 DIAGNOSIS — Z9861 Coronary angioplasty status: Secondary | ICD-10-CM | POA: Diagnosis not present

## 2015-01-06 DIAGNOSIS — I340 Nonrheumatic mitral (valve) insufficiency: Secondary | ICD-10-CM | POA: Diagnosis not present

## 2015-01-06 DIAGNOSIS — G4733 Obstructive sleep apnea (adult) (pediatric): Secondary | ICD-10-CM | POA: Diagnosis present

## 2015-01-06 DIAGNOSIS — Z79899 Other long term (current) drug therapy: Secondary | ICD-10-CM | POA: Diagnosis not present

## 2015-01-06 DIAGNOSIS — Z7982 Long term (current) use of aspirin: Secondary | ICD-10-CM | POA: Diagnosis not present

## 2015-01-06 DIAGNOSIS — J939 Pneumothorax, unspecified: Secondary | ICD-10-CM | POA: Diagnosis not present

## 2015-01-06 DIAGNOSIS — K219 Gastro-esophageal reflux disease without esophagitis: Secondary | ICD-10-CM | POA: Diagnosis present

## 2015-01-06 DIAGNOSIS — N179 Acute kidney failure, unspecified: Secondary | ICD-10-CM | POA: Diagnosis not present

## 2015-01-06 DIAGNOSIS — J90 Pleural effusion, not elsewhere classified: Secondary | ICD-10-CM | POA: Diagnosis not present

## 2015-01-06 DIAGNOSIS — Z6833 Body mass index (BMI) 33.0-33.9, adult: Secondary | ICD-10-CM | POA: Diagnosis not present

## 2015-01-06 DIAGNOSIS — Z794 Long term (current) use of insulin: Secondary | ICD-10-CM | POA: Diagnosis not present

## 2015-01-06 DIAGNOSIS — E669 Obesity, unspecified: Secondary | ICD-10-CM | POA: Diagnosis not present

## 2015-01-06 DIAGNOSIS — G629 Polyneuropathy, unspecified: Secondary | ICD-10-CM | POA: Diagnosis present

## 2015-01-06 DIAGNOSIS — E785 Hyperlipidemia, unspecified: Secondary | ICD-10-CM | POA: Diagnosis present

## 2015-01-06 DIAGNOSIS — I10 Essential (primary) hypertension: Secondary | ICD-10-CM | POA: Diagnosis present

## 2015-01-06 DIAGNOSIS — Z452 Encounter for adjustment and management of vascular access device: Secondary | ICD-10-CM | POA: Diagnosis not present

## 2015-01-06 DIAGNOSIS — R197 Diarrhea, unspecified: Secondary | ICD-10-CM | POA: Diagnosis not present

## 2015-01-06 DIAGNOSIS — E11649 Type 2 diabetes mellitus with hypoglycemia without coma: Secondary | ICD-10-CM | POA: Diagnosis not present

## 2015-01-06 DIAGNOSIS — E162 Hypoglycemia, unspecified: Secondary | ICD-10-CM | POA: Diagnosis not present

## 2015-01-06 DIAGNOSIS — I25119 Atherosclerotic heart disease of native coronary artery with unspecified angina pectoris: Secondary | ICD-10-CM | POA: Diagnosis present

## 2015-01-06 DIAGNOSIS — I2510 Atherosclerotic heart disease of native coronary artery without angina pectoris: Secondary | ICD-10-CM | POA: Diagnosis not present

## 2015-01-06 DIAGNOSIS — Z4682 Encounter for fitting and adjustment of non-vascular catheter: Secondary | ICD-10-CM | POA: Diagnosis not present

## 2015-01-06 DIAGNOSIS — R918 Other nonspecific abnormal finding of lung field: Secondary | ICD-10-CM | POA: Diagnosis not present

## 2015-01-06 DIAGNOSIS — E079 Disorder of thyroid, unspecified: Secondary | ICD-10-CM | POA: Diagnosis present

## 2015-01-06 DIAGNOSIS — E1165 Type 2 diabetes mellitus with hyperglycemia: Secondary | ICD-10-CM | POA: Diagnosis not present

## 2015-01-06 DIAGNOSIS — R739 Hyperglycemia, unspecified: Secondary | ICD-10-CM | POA: Diagnosis not present

## 2015-01-22 DIAGNOSIS — E1165 Type 2 diabetes mellitus with hyperglycemia: Secondary | ICD-10-CM | POA: Diagnosis not present

## 2015-01-23 DIAGNOSIS — E1140 Type 2 diabetes mellitus with diabetic neuropathy, unspecified: Secondary | ICD-10-CM | POA: Diagnosis not present

## 2015-01-23 DIAGNOSIS — L89893 Pressure ulcer of other site, stage 3: Secondary | ICD-10-CM | POA: Diagnosis not present

## 2015-01-23 DIAGNOSIS — R55 Syncope and collapse: Secondary | ICD-10-CM | POA: Diagnosis not present

## 2015-01-23 DIAGNOSIS — R42 Dizziness and giddiness: Secondary | ICD-10-CM | POA: Diagnosis not present

## 2015-01-23 DIAGNOSIS — Z951 Presence of aortocoronary bypass graft: Secondary | ICD-10-CM | POA: Diagnosis not present

## 2015-01-23 DIAGNOSIS — I2510 Atherosclerotic heart disease of native coronary artery without angina pectoris: Secondary | ICD-10-CM | POA: Diagnosis not present

## 2015-01-27 DIAGNOSIS — I2510 Atherosclerotic heart disease of native coronary artery without angina pectoris: Secondary | ICD-10-CM | POA: Diagnosis not present

## 2015-01-27 DIAGNOSIS — Z951 Presence of aortocoronary bypass graft: Secondary | ICD-10-CM | POA: Diagnosis not present

## 2015-02-05 DIAGNOSIS — E1140 Type 2 diabetes mellitus with diabetic neuropathy, unspecified: Secondary | ICD-10-CM | POA: Diagnosis not present

## 2015-02-05 DIAGNOSIS — L110 Acquired keratosis follicularis: Secondary | ICD-10-CM | POA: Diagnosis not present

## 2015-02-05 DIAGNOSIS — N19 Unspecified kidney failure: Secondary | ICD-10-CM | POA: Diagnosis not present

## 2015-02-05 DIAGNOSIS — R55 Syncope and collapse: Secondary | ICD-10-CM | POA: Diagnosis not present

## 2015-02-10 DIAGNOSIS — M47816 Spondylosis without myelopathy or radiculopathy, lumbar region: Secondary | ICD-10-CM | POA: Diagnosis not present

## 2015-02-10 DIAGNOSIS — M9903 Segmental and somatic dysfunction of lumbar region: Secondary | ICD-10-CM | POA: Diagnosis not present

## 2015-02-10 DIAGNOSIS — M47812 Spondylosis without myelopathy or radiculopathy, cervical region: Secondary | ICD-10-CM | POA: Diagnosis not present

## 2015-02-10 DIAGNOSIS — M5136 Other intervertebral disc degeneration, lumbar region: Secondary | ICD-10-CM | POA: Diagnosis not present

## 2015-02-10 DIAGNOSIS — S335XXA Sprain of ligaments of lumbar spine, initial encounter: Secondary | ICD-10-CM | POA: Diagnosis not present

## 2015-02-10 DIAGNOSIS — M545 Low back pain: Secondary | ICD-10-CM | POA: Diagnosis not present

## 2015-02-10 DIAGNOSIS — M9901 Segmental and somatic dysfunction of cervical region: Secondary | ICD-10-CM | POA: Diagnosis not present

## 2015-02-10 DIAGNOSIS — M546 Pain in thoracic spine: Secondary | ICD-10-CM | POA: Diagnosis not present

## 2015-02-10 DIAGNOSIS — M4004 Postural kyphosis, thoracic region: Secondary | ICD-10-CM | POA: Diagnosis not present

## 2015-02-10 DIAGNOSIS — M9902 Segmental and somatic dysfunction of thoracic region: Secondary | ICD-10-CM | POA: Diagnosis not present

## 2015-02-12 DIAGNOSIS — M4004 Postural kyphosis, thoracic region: Secondary | ICD-10-CM | POA: Diagnosis not present

## 2015-02-12 DIAGNOSIS — M5136 Other intervertebral disc degeneration, lumbar region: Secondary | ICD-10-CM | POA: Diagnosis not present

## 2015-02-12 DIAGNOSIS — M9901 Segmental and somatic dysfunction of cervical region: Secondary | ICD-10-CM | POA: Diagnosis not present

## 2015-02-12 DIAGNOSIS — M546 Pain in thoracic spine: Secondary | ICD-10-CM | POA: Diagnosis not present

## 2015-02-12 DIAGNOSIS — M9902 Segmental and somatic dysfunction of thoracic region: Secondary | ICD-10-CM | POA: Diagnosis not present

## 2015-02-12 DIAGNOSIS — S335XXA Sprain of ligaments of lumbar spine, initial encounter: Secondary | ICD-10-CM | POA: Diagnosis not present

## 2015-02-12 DIAGNOSIS — M9903 Segmental and somatic dysfunction of lumbar region: Secondary | ICD-10-CM | POA: Diagnosis not present

## 2015-02-12 DIAGNOSIS — M545 Low back pain: Secondary | ICD-10-CM | POA: Diagnosis not present

## 2015-02-12 DIAGNOSIS — M47812 Spondylosis without myelopathy or radiculopathy, cervical region: Secondary | ICD-10-CM | POA: Diagnosis not present

## 2015-02-12 DIAGNOSIS — M47816 Spondylosis without myelopathy or radiculopathy, lumbar region: Secondary | ICD-10-CM | POA: Diagnosis not present

## 2015-02-14 DIAGNOSIS — M4004 Postural kyphosis, thoracic region: Secondary | ICD-10-CM | POA: Diagnosis not present

## 2015-02-14 DIAGNOSIS — M546 Pain in thoracic spine: Secondary | ICD-10-CM | POA: Diagnosis not present

## 2015-02-14 DIAGNOSIS — M47816 Spondylosis without myelopathy or radiculopathy, lumbar region: Secondary | ICD-10-CM | POA: Diagnosis not present

## 2015-02-14 DIAGNOSIS — M47812 Spondylosis without myelopathy or radiculopathy, cervical region: Secondary | ICD-10-CM | POA: Diagnosis not present

## 2015-02-14 DIAGNOSIS — M9901 Segmental and somatic dysfunction of cervical region: Secondary | ICD-10-CM | POA: Diagnosis not present

## 2015-02-14 DIAGNOSIS — M545 Low back pain: Secondary | ICD-10-CM | POA: Diagnosis not present

## 2015-02-14 DIAGNOSIS — M9902 Segmental and somatic dysfunction of thoracic region: Secondary | ICD-10-CM | POA: Diagnosis not present

## 2015-02-14 DIAGNOSIS — S335XXA Sprain of ligaments of lumbar spine, initial encounter: Secondary | ICD-10-CM | POA: Diagnosis not present

## 2015-02-14 DIAGNOSIS — M9903 Segmental and somatic dysfunction of lumbar region: Secondary | ICD-10-CM | POA: Diagnosis not present

## 2015-02-14 DIAGNOSIS — M5136 Other intervertebral disc degeneration, lumbar region: Secondary | ICD-10-CM | POA: Diagnosis not present

## 2015-02-17 DIAGNOSIS — M9902 Segmental and somatic dysfunction of thoracic region: Secondary | ICD-10-CM | POA: Diagnosis not present

## 2015-02-17 DIAGNOSIS — M9903 Segmental and somatic dysfunction of lumbar region: Secondary | ICD-10-CM | POA: Diagnosis not present

## 2015-02-17 DIAGNOSIS — S335XXA Sprain of ligaments of lumbar spine, initial encounter: Secondary | ICD-10-CM | POA: Diagnosis not present

## 2015-02-17 DIAGNOSIS — M47816 Spondylosis without myelopathy or radiculopathy, lumbar region: Secondary | ICD-10-CM | POA: Diagnosis not present

## 2015-02-17 DIAGNOSIS — M545 Low back pain: Secondary | ICD-10-CM | POA: Diagnosis not present

## 2015-02-17 DIAGNOSIS — M47812 Spondylosis without myelopathy or radiculopathy, cervical region: Secondary | ICD-10-CM | POA: Diagnosis not present

## 2015-02-17 DIAGNOSIS — M4004 Postural kyphosis, thoracic region: Secondary | ICD-10-CM | POA: Diagnosis not present

## 2015-02-17 DIAGNOSIS — M546 Pain in thoracic spine: Secondary | ICD-10-CM | POA: Diagnosis not present

## 2015-02-17 DIAGNOSIS — M9901 Segmental and somatic dysfunction of cervical region: Secondary | ICD-10-CM | POA: Diagnosis not present

## 2015-02-17 DIAGNOSIS — M5136 Other intervertebral disc degeneration, lumbar region: Secondary | ICD-10-CM | POA: Diagnosis not present

## 2015-02-19 DIAGNOSIS — E1165 Type 2 diabetes mellitus with hyperglycemia: Secondary | ICD-10-CM | POA: Diagnosis not present

## 2015-02-20 DIAGNOSIS — M47812 Spondylosis without myelopathy or radiculopathy, cervical region: Secondary | ICD-10-CM | POA: Diagnosis not present

## 2015-02-20 DIAGNOSIS — M545 Low back pain: Secondary | ICD-10-CM | POA: Diagnosis not present

## 2015-02-20 DIAGNOSIS — M9901 Segmental and somatic dysfunction of cervical region: Secondary | ICD-10-CM | POA: Diagnosis not present

## 2015-02-20 DIAGNOSIS — M4004 Postural kyphosis, thoracic region: Secondary | ICD-10-CM | POA: Diagnosis not present

## 2015-02-20 DIAGNOSIS — S335XXA Sprain of ligaments of lumbar spine, initial encounter: Secondary | ICD-10-CM | POA: Diagnosis not present

## 2015-02-20 DIAGNOSIS — L89892 Pressure ulcer of other site, stage 2: Secondary | ICD-10-CM | POA: Diagnosis not present

## 2015-02-20 DIAGNOSIS — M5136 Other intervertebral disc degeneration, lumbar region: Secondary | ICD-10-CM | POA: Diagnosis not present

## 2015-02-20 DIAGNOSIS — M546 Pain in thoracic spine: Secondary | ICD-10-CM | POA: Diagnosis not present

## 2015-02-20 DIAGNOSIS — M792 Neuralgia and neuritis, unspecified: Secondary | ICD-10-CM | POA: Diagnosis not present

## 2015-02-20 DIAGNOSIS — M9903 Segmental and somatic dysfunction of lumbar region: Secondary | ICD-10-CM | POA: Diagnosis not present

## 2015-02-20 DIAGNOSIS — M47816 Spondylosis without myelopathy or radiculopathy, lumbar region: Secondary | ICD-10-CM | POA: Diagnosis not present

## 2015-02-20 DIAGNOSIS — E1140 Type 2 diabetes mellitus with diabetic neuropathy, unspecified: Secondary | ICD-10-CM | POA: Diagnosis not present

## 2015-02-20 DIAGNOSIS — M9902 Segmental and somatic dysfunction of thoracic region: Secondary | ICD-10-CM | POA: Diagnosis not present

## 2015-02-24 DIAGNOSIS — M4004 Postural kyphosis, thoracic region: Secondary | ICD-10-CM | POA: Diagnosis not present

## 2015-02-24 DIAGNOSIS — M9903 Segmental and somatic dysfunction of lumbar region: Secondary | ICD-10-CM | POA: Diagnosis not present

## 2015-02-24 DIAGNOSIS — M9901 Segmental and somatic dysfunction of cervical region: Secondary | ICD-10-CM | POA: Diagnosis not present

## 2015-02-24 DIAGNOSIS — M47812 Spondylosis without myelopathy or radiculopathy, cervical region: Secondary | ICD-10-CM | POA: Diagnosis not present

## 2015-02-24 DIAGNOSIS — M47816 Spondylosis without myelopathy or radiculopathy, lumbar region: Secondary | ICD-10-CM | POA: Diagnosis not present

## 2015-02-24 DIAGNOSIS — M545 Low back pain: Secondary | ICD-10-CM | POA: Diagnosis not present

## 2015-02-24 DIAGNOSIS — S335XXA Sprain of ligaments of lumbar spine, initial encounter: Secondary | ICD-10-CM | POA: Diagnosis not present

## 2015-02-24 DIAGNOSIS — M5136 Other intervertebral disc degeneration, lumbar region: Secondary | ICD-10-CM | POA: Diagnosis not present

## 2015-02-24 DIAGNOSIS — M9902 Segmental and somatic dysfunction of thoracic region: Secondary | ICD-10-CM | POA: Diagnosis not present

## 2015-02-24 DIAGNOSIS — M546 Pain in thoracic spine: Secondary | ICD-10-CM | POA: Diagnosis not present

## 2015-02-25 DIAGNOSIS — Z7982 Long term (current) use of aspirin: Secondary | ICD-10-CM | POA: Diagnosis not present

## 2015-02-25 DIAGNOSIS — I10 Essential (primary) hypertension: Secondary | ICD-10-CM | POA: Diagnosis not present

## 2015-02-25 DIAGNOSIS — Z951 Presence of aortocoronary bypass graft: Secondary | ICD-10-CM | POA: Diagnosis not present

## 2015-02-25 DIAGNOSIS — Z9181 History of falling: Secondary | ICD-10-CM | POA: Diagnosis not present

## 2015-02-25 DIAGNOSIS — I2510 Atherosclerotic heart disease of native coronary artery without angina pectoris: Secondary | ICD-10-CM | POA: Diagnosis not present

## 2015-02-25 DIAGNOSIS — R0602 Shortness of breath: Secondary | ICD-10-CM | POA: Diagnosis not present

## 2015-02-25 DIAGNOSIS — Z91030 Bee allergy status: Secondary | ICD-10-CM | POA: Diagnosis not present

## 2015-02-25 DIAGNOSIS — K219 Gastro-esophageal reflux disease without esophagitis: Secondary | ICD-10-CM | POA: Diagnosis not present

## 2015-02-25 DIAGNOSIS — Z794 Long term (current) use of insulin: Secondary | ICD-10-CM | POA: Diagnosis not present

## 2015-02-25 DIAGNOSIS — R0600 Dyspnea, unspecified: Secondary | ICD-10-CM | POA: Diagnosis not present

## 2015-02-25 DIAGNOSIS — Z66 Do not resuscitate: Secondary | ICD-10-CM | POA: Diagnosis not present

## 2015-02-25 DIAGNOSIS — Z5189 Encounter for other specified aftercare: Secondary | ICD-10-CM | POA: Diagnosis not present

## 2015-02-25 DIAGNOSIS — L89892 Pressure ulcer of other site, stage 2: Secondary | ICD-10-CM | POA: Diagnosis not present

## 2015-02-25 DIAGNOSIS — G4730 Sleep apnea, unspecified: Secondary | ICD-10-CM | POA: Diagnosis not present

## 2015-02-25 DIAGNOSIS — M1990 Unspecified osteoarthritis, unspecified site: Secondary | ICD-10-CM | POA: Diagnosis not present

## 2015-02-25 DIAGNOSIS — Z79899 Other long term (current) drug therapy: Secondary | ICD-10-CM | POA: Diagnosis not present

## 2015-02-25 DIAGNOSIS — Z9889 Other specified postprocedural states: Secondary | ICD-10-CM | POA: Diagnosis not present

## 2015-02-25 DIAGNOSIS — G629 Polyneuropathy, unspecified: Secondary | ICD-10-CM | POA: Diagnosis not present

## 2015-02-25 DIAGNOSIS — E119 Type 2 diabetes mellitus without complications: Secondary | ICD-10-CM | POA: Diagnosis not present

## 2015-02-26 DIAGNOSIS — K219 Gastro-esophageal reflux disease without esophagitis: Secondary | ICD-10-CM | POA: Diagnosis not present

## 2015-02-26 DIAGNOSIS — I10 Essential (primary) hypertension: Secondary | ICD-10-CM | POA: Diagnosis not present

## 2015-02-26 DIAGNOSIS — I2510 Atherosclerotic heart disease of native coronary artery without angina pectoris: Secondary | ICD-10-CM | POA: Diagnosis not present

## 2015-02-26 DIAGNOSIS — Z5189 Encounter for other specified aftercare: Secondary | ICD-10-CM | POA: Diagnosis not present

## 2015-02-26 DIAGNOSIS — E119 Type 2 diabetes mellitus without complications: Secondary | ICD-10-CM | POA: Diagnosis not present

## 2015-02-26 DIAGNOSIS — Z951 Presence of aortocoronary bypass graft: Secondary | ICD-10-CM | POA: Diagnosis not present

## 2015-02-27 DIAGNOSIS — M47812 Spondylosis without myelopathy or radiculopathy, cervical region: Secondary | ICD-10-CM | POA: Diagnosis not present

## 2015-02-27 DIAGNOSIS — M9903 Segmental and somatic dysfunction of lumbar region: Secondary | ICD-10-CM | POA: Diagnosis not present

## 2015-02-27 DIAGNOSIS — M546 Pain in thoracic spine: Secondary | ICD-10-CM | POA: Diagnosis not present

## 2015-02-27 DIAGNOSIS — M545 Low back pain: Secondary | ICD-10-CM | POA: Diagnosis not present

## 2015-02-27 DIAGNOSIS — M9902 Segmental and somatic dysfunction of thoracic region: Secondary | ICD-10-CM | POA: Diagnosis not present

## 2015-02-27 DIAGNOSIS — M5136 Other intervertebral disc degeneration, lumbar region: Secondary | ICD-10-CM | POA: Diagnosis not present

## 2015-02-27 DIAGNOSIS — M4004 Postural kyphosis, thoracic region: Secondary | ICD-10-CM | POA: Diagnosis not present

## 2015-02-27 DIAGNOSIS — M47816 Spondylosis without myelopathy or radiculopathy, lumbar region: Secondary | ICD-10-CM | POA: Diagnosis not present

## 2015-02-27 DIAGNOSIS — S335XXA Sprain of ligaments of lumbar spine, initial encounter: Secondary | ICD-10-CM | POA: Diagnosis not present

## 2015-02-27 DIAGNOSIS — M9901 Segmental and somatic dysfunction of cervical region: Secondary | ICD-10-CM | POA: Diagnosis not present

## 2015-02-28 DIAGNOSIS — K219 Gastro-esophageal reflux disease without esophagitis: Secondary | ICD-10-CM | POA: Diagnosis not present

## 2015-02-28 DIAGNOSIS — E119 Type 2 diabetes mellitus without complications: Secondary | ICD-10-CM | POA: Diagnosis not present

## 2015-02-28 DIAGNOSIS — I2510 Atherosclerotic heart disease of native coronary artery without angina pectoris: Secondary | ICD-10-CM | POA: Diagnosis not present

## 2015-02-28 DIAGNOSIS — Z951 Presence of aortocoronary bypass graft: Secondary | ICD-10-CM | POA: Diagnosis not present

## 2015-02-28 DIAGNOSIS — Z5189 Encounter for other specified aftercare: Secondary | ICD-10-CM | POA: Diagnosis not present

## 2015-02-28 DIAGNOSIS — I10 Essential (primary) hypertension: Secondary | ICD-10-CM | POA: Diagnosis not present

## 2015-03-03 DIAGNOSIS — Z5189 Encounter for other specified aftercare: Secondary | ICD-10-CM | POA: Diagnosis not present

## 2015-03-03 DIAGNOSIS — I2510 Atherosclerotic heart disease of native coronary artery without angina pectoris: Secondary | ICD-10-CM | POA: Diagnosis not present

## 2015-03-03 DIAGNOSIS — Z951 Presence of aortocoronary bypass graft: Secondary | ICD-10-CM | POA: Diagnosis not present

## 2015-03-03 DIAGNOSIS — I10 Essential (primary) hypertension: Secondary | ICD-10-CM | POA: Diagnosis not present

## 2015-03-03 DIAGNOSIS — K219 Gastro-esophageal reflux disease without esophagitis: Secondary | ICD-10-CM | POA: Diagnosis not present

## 2015-03-03 DIAGNOSIS — E119 Type 2 diabetes mellitus without complications: Secondary | ICD-10-CM | POA: Diagnosis not present

## 2015-03-04 DIAGNOSIS — M47812 Spondylosis without myelopathy or radiculopathy, cervical region: Secondary | ICD-10-CM | POA: Diagnosis not present

## 2015-03-04 DIAGNOSIS — M4004 Postural kyphosis, thoracic region: Secondary | ICD-10-CM | POA: Diagnosis not present

## 2015-03-04 DIAGNOSIS — Z5189 Encounter for other specified aftercare: Secondary | ICD-10-CM | POA: Diagnosis not present

## 2015-03-04 DIAGNOSIS — M546 Pain in thoracic spine: Secondary | ICD-10-CM | POA: Diagnosis not present

## 2015-03-04 DIAGNOSIS — M9901 Segmental and somatic dysfunction of cervical region: Secondary | ICD-10-CM | POA: Diagnosis not present

## 2015-03-04 DIAGNOSIS — M47816 Spondylosis without myelopathy or radiculopathy, lumbar region: Secondary | ICD-10-CM | POA: Diagnosis not present

## 2015-03-04 DIAGNOSIS — S335XXA Sprain of ligaments of lumbar spine, initial encounter: Secondary | ICD-10-CM | POA: Diagnosis not present

## 2015-03-04 DIAGNOSIS — M5136 Other intervertebral disc degeneration, lumbar region: Secondary | ICD-10-CM | POA: Diagnosis not present

## 2015-03-04 DIAGNOSIS — M545 Low back pain: Secondary | ICD-10-CM | POA: Diagnosis not present

## 2015-03-04 DIAGNOSIS — Z951 Presence of aortocoronary bypass graft: Secondary | ICD-10-CM | POA: Diagnosis not present

## 2015-03-04 DIAGNOSIS — I2510 Atherosclerotic heart disease of native coronary artery without angina pectoris: Secondary | ICD-10-CM | POA: Diagnosis not present

## 2015-03-04 DIAGNOSIS — M9903 Segmental and somatic dysfunction of lumbar region: Secondary | ICD-10-CM | POA: Diagnosis not present

## 2015-03-04 DIAGNOSIS — E119 Type 2 diabetes mellitus without complications: Secondary | ICD-10-CM | POA: Diagnosis not present

## 2015-03-04 DIAGNOSIS — M9902 Segmental and somatic dysfunction of thoracic region: Secondary | ICD-10-CM | POA: Diagnosis not present

## 2015-03-04 DIAGNOSIS — K219 Gastro-esophageal reflux disease without esophagitis: Secondary | ICD-10-CM | POA: Diagnosis not present

## 2015-03-04 DIAGNOSIS — I10 Essential (primary) hypertension: Secondary | ICD-10-CM | POA: Diagnosis not present

## 2015-03-10 DIAGNOSIS — Z5189 Encounter for other specified aftercare: Secondary | ICD-10-CM | POA: Diagnosis not present

## 2015-03-10 DIAGNOSIS — E119 Type 2 diabetes mellitus without complications: Secondary | ICD-10-CM | POA: Diagnosis not present

## 2015-03-10 DIAGNOSIS — I10 Essential (primary) hypertension: Secondary | ICD-10-CM | POA: Diagnosis not present

## 2015-03-10 DIAGNOSIS — Z951 Presence of aortocoronary bypass graft: Secondary | ICD-10-CM | POA: Diagnosis not present

## 2015-03-10 DIAGNOSIS — I2510 Atherosclerotic heart disease of native coronary artery without angina pectoris: Secondary | ICD-10-CM | POA: Diagnosis not present

## 2015-03-10 DIAGNOSIS — K219 Gastro-esophageal reflux disease without esophagitis: Secondary | ICD-10-CM | POA: Diagnosis not present

## 2015-03-12 DIAGNOSIS — E1140 Type 2 diabetes mellitus with diabetic neuropathy, unspecified: Secondary | ICD-10-CM | POA: Diagnosis not present

## 2015-03-12 DIAGNOSIS — L89893 Pressure ulcer of other site, stage 3: Secondary | ICD-10-CM | POA: Diagnosis not present

## 2015-03-18 DIAGNOSIS — M5136 Other intervertebral disc degeneration, lumbar region: Secondary | ICD-10-CM | POA: Diagnosis not present

## 2015-03-18 DIAGNOSIS — M545 Low back pain: Secondary | ICD-10-CM | POA: Diagnosis not present

## 2015-03-18 DIAGNOSIS — M9902 Segmental and somatic dysfunction of thoracic region: Secondary | ICD-10-CM | POA: Diagnosis not present

## 2015-03-18 DIAGNOSIS — M47816 Spondylosis without myelopathy or radiculopathy, lumbar region: Secondary | ICD-10-CM | POA: Diagnosis not present

## 2015-03-18 DIAGNOSIS — M9901 Segmental and somatic dysfunction of cervical region: Secondary | ICD-10-CM | POA: Diagnosis not present

## 2015-03-18 DIAGNOSIS — M546 Pain in thoracic spine: Secondary | ICD-10-CM | POA: Diagnosis not present

## 2015-03-18 DIAGNOSIS — M47812 Spondylosis without myelopathy or radiculopathy, cervical region: Secondary | ICD-10-CM | POA: Diagnosis not present

## 2015-03-18 DIAGNOSIS — S335XXA Sprain of ligaments of lumbar spine, initial encounter: Secondary | ICD-10-CM | POA: Diagnosis not present

## 2015-03-18 DIAGNOSIS — M4004 Postural kyphosis, thoracic region: Secondary | ICD-10-CM | POA: Diagnosis not present

## 2015-03-18 DIAGNOSIS — M9903 Segmental and somatic dysfunction of lumbar region: Secondary | ICD-10-CM | POA: Diagnosis not present

## 2015-03-25 DIAGNOSIS — M546 Pain in thoracic spine: Secondary | ICD-10-CM | POA: Diagnosis not present

## 2015-03-25 DIAGNOSIS — M9901 Segmental and somatic dysfunction of cervical region: Secondary | ICD-10-CM | POA: Diagnosis not present

## 2015-03-25 DIAGNOSIS — M4004 Postural kyphosis, thoracic region: Secondary | ICD-10-CM | POA: Diagnosis not present

## 2015-03-25 DIAGNOSIS — S335XXA Sprain of ligaments of lumbar spine, initial encounter: Secondary | ICD-10-CM | POA: Diagnosis not present

## 2015-03-25 DIAGNOSIS — M545 Low back pain: Secondary | ICD-10-CM | POA: Diagnosis not present

## 2015-03-25 DIAGNOSIS — M5136 Other intervertebral disc degeneration, lumbar region: Secondary | ICD-10-CM | POA: Diagnosis not present

## 2015-03-25 DIAGNOSIS — M47816 Spondylosis without myelopathy or radiculopathy, lumbar region: Secondary | ICD-10-CM | POA: Diagnosis not present

## 2015-03-25 DIAGNOSIS — M47812 Spondylosis without myelopathy or radiculopathy, cervical region: Secondary | ICD-10-CM | POA: Diagnosis not present

## 2015-03-25 DIAGNOSIS — M9902 Segmental and somatic dysfunction of thoracic region: Secondary | ICD-10-CM | POA: Diagnosis not present

## 2015-03-25 DIAGNOSIS — M9903 Segmental and somatic dysfunction of lumbar region: Secondary | ICD-10-CM | POA: Diagnosis not present

## 2015-03-26 DIAGNOSIS — E1140 Type 2 diabetes mellitus with diabetic neuropathy, unspecified: Secondary | ICD-10-CM | POA: Diagnosis not present

## 2015-03-26 DIAGNOSIS — G600 Hereditary motor and sensory neuropathy: Secondary | ICD-10-CM | POA: Diagnosis not present

## 2015-03-26 DIAGNOSIS — M792 Neuralgia and neuritis, unspecified: Secondary | ICD-10-CM | POA: Diagnosis not present

## 2015-03-26 DIAGNOSIS — G5790 Unspecified mononeuropathy of unspecified lower limb: Secondary | ICD-10-CM | POA: Diagnosis not present

## 2015-03-26 DIAGNOSIS — L89893 Pressure ulcer of other site, stage 3: Secondary | ICD-10-CM | POA: Diagnosis not present

## 2015-03-27 DIAGNOSIS — E113313 Type 2 diabetes mellitus with moderate nonproliferative diabetic retinopathy with macular edema, bilateral: Secondary | ICD-10-CM | POA: Diagnosis not present

## 2015-04-01 DIAGNOSIS — M4004 Postural kyphosis, thoracic region: Secondary | ICD-10-CM | POA: Diagnosis not present

## 2015-04-01 DIAGNOSIS — M545 Low back pain: Secondary | ICD-10-CM | POA: Diagnosis not present

## 2015-04-01 DIAGNOSIS — S335XXA Sprain of ligaments of lumbar spine, initial encounter: Secondary | ICD-10-CM | POA: Diagnosis not present

## 2015-04-01 DIAGNOSIS — M9901 Segmental and somatic dysfunction of cervical region: Secondary | ICD-10-CM | POA: Diagnosis not present

## 2015-04-01 DIAGNOSIS — M5136 Other intervertebral disc degeneration, lumbar region: Secondary | ICD-10-CM | POA: Diagnosis not present

## 2015-04-01 DIAGNOSIS — M9903 Segmental and somatic dysfunction of lumbar region: Secondary | ICD-10-CM | POA: Diagnosis not present

## 2015-04-01 DIAGNOSIS — M47812 Spondylosis without myelopathy or radiculopathy, cervical region: Secondary | ICD-10-CM | POA: Diagnosis not present

## 2015-04-01 DIAGNOSIS — M9902 Segmental and somatic dysfunction of thoracic region: Secondary | ICD-10-CM | POA: Diagnosis not present

## 2015-04-01 DIAGNOSIS — M546 Pain in thoracic spine: Secondary | ICD-10-CM | POA: Diagnosis not present

## 2015-04-01 DIAGNOSIS — M47816 Spondylosis without myelopathy or radiculopathy, lumbar region: Secondary | ICD-10-CM | POA: Diagnosis not present

## 2015-04-10 DIAGNOSIS — I10 Essential (primary) hypertension: Secondary | ICD-10-CM | POA: Diagnosis not present

## 2015-04-10 DIAGNOSIS — Z79899 Other long term (current) drug therapy: Secondary | ICD-10-CM | POA: Diagnosis not present

## 2015-04-10 DIAGNOSIS — R0789 Other chest pain: Secondary | ICD-10-CM | POA: Diagnosis not present

## 2015-04-10 DIAGNOSIS — Z794 Long term (current) use of insulin: Secondary | ICD-10-CM | POA: Diagnosis not present

## 2015-04-10 DIAGNOSIS — E119 Type 2 diabetes mellitus without complications: Secondary | ICD-10-CM | POA: Diagnosis not present

## 2015-04-10 DIAGNOSIS — L89893 Pressure ulcer of other site, stage 3: Secondary | ICD-10-CM | POA: Diagnosis not present

## 2015-04-10 DIAGNOSIS — Z951 Presence of aortocoronary bypass graft: Secondary | ICD-10-CM | POA: Diagnosis not present

## 2015-04-10 DIAGNOSIS — E1140 Type 2 diabetes mellitus with diabetic neuropathy, unspecified: Secondary | ICD-10-CM | POA: Diagnosis not present

## 2015-04-10 DIAGNOSIS — M792 Neuralgia and neuritis, unspecified: Secondary | ICD-10-CM | POA: Diagnosis not present

## 2015-04-10 DIAGNOSIS — R072 Precordial pain: Secondary | ICD-10-CM | POA: Diagnosis not present

## 2015-04-10 DIAGNOSIS — Z7982 Long term (current) use of aspirin: Secondary | ICD-10-CM | POA: Diagnosis not present

## 2015-04-10 DIAGNOSIS — T84218A Breakdown (mechanical) of internal fixation device of other bones, initial encounter: Secondary | ICD-10-CM | POA: Diagnosis not present

## 2015-04-17 DIAGNOSIS — R0789 Other chest pain: Secondary | ICD-10-CM | POA: Diagnosis not present

## 2015-04-23 DIAGNOSIS — E1140 Type 2 diabetes mellitus with diabetic neuropathy, unspecified: Secondary | ICD-10-CM | POA: Diagnosis not present

## 2015-04-23 DIAGNOSIS — L89893 Pressure ulcer of other site, stage 3: Secondary | ICD-10-CM | POA: Diagnosis not present

## 2015-05-02 DIAGNOSIS — D519 Vitamin B12 deficiency anemia, unspecified: Secondary | ICD-10-CM | POA: Diagnosis not present

## 2015-05-02 DIAGNOSIS — E1143 Type 2 diabetes mellitus with diabetic autonomic (poly)neuropathy: Secondary | ICD-10-CM | POA: Diagnosis not present

## 2015-05-02 DIAGNOSIS — R55 Syncope and collapse: Secondary | ICD-10-CM | POA: Diagnosis not present

## 2015-05-02 DIAGNOSIS — I10 Essential (primary) hypertension: Secondary | ICD-10-CM | POA: Diagnosis not present

## 2015-05-02 DIAGNOSIS — E7800 Pure hypercholesterolemia, unspecified: Secondary | ICD-10-CM | POA: Diagnosis not present

## 2015-05-06 DIAGNOSIS — R55 Syncope and collapse: Secondary | ICD-10-CM | POA: Diagnosis not present

## 2015-05-06 DIAGNOSIS — I10 Essential (primary) hypertension: Secondary | ICD-10-CM | POA: Diagnosis not present

## 2015-05-06 DIAGNOSIS — E1165 Type 2 diabetes mellitus with hyperglycemia: Secondary | ICD-10-CM | POA: Diagnosis not present

## 2015-05-06 DIAGNOSIS — E291 Testicular hypofunction: Secondary | ICD-10-CM | POA: Diagnosis not present

## 2015-05-06 DIAGNOSIS — Z23 Encounter for immunization: Secondary | ICD-10-CM | POA: Diagnosis not present

## 2015-05-06 DIAGNOSIS — E1143 Type 2 diabetes mellitus with diabetic autonomic (poly)neuropathy: Secondary | ICD-10-CM | POA: Diagnosis not present

## 2015-05-07 DIAGNOSIS — L89893 Pressure ulcer of other site, stage 3: Secondary | ICD-10-CM | POA: Diagnosis not present

## 2015-05-07 DIAGNOSIS — E1140 Type 2 diabetes mellitus with diabetic neuropathy, unspecified: Secondary | ICD-10-CM | POA: Diagnosis not present

## 2015-05-14 DIAGNOSIS — L89893 Pressure ulcer of other site, stage 3: Secondary | ICD-10-CM | POA: Diagnosis not present

## 2015-05-14 DIAGNOSIS — Z85828 Personal history of other malignant neoplasm of skin: Secondary | ICD-10-CM | POA: Diagnosis not present

## 2015-05-14 DIAGNOSIS — E1140 Type 2 diabetes mellitus with diabetic neuropathy, unspecified: Secondary | ICD-10-CM | POA: Diagnosis not present

## 2015-05-14 DIAGNOSIS — L570 Actinic keratosis: Secondary | ICD-10-CM | POA: Diagnosis not present

## 2015-05-14 DIAGNOSIS — L219 Seborrheic dermatitis, unspecified: Secondary | ICD-10-CM | POA: Diagnosis not present

## 2015-05-21 DIAGNOSIS — L89893 Pressure ulcer of other site, stage 3: Secondary | ICD-10-CM | POA: Diagnosis not present

## 2015-05-21 DIAGNOSIS — E1140 Type 2 diabetes mellitus with diabetic neuropathy, unspecified: Secondary | ICD-10-CM | POA: Diagnosis not present

## 2015-05-27 DIAGNOSIS — I951 Orthostatic hypotension: Secondary | ICD-10-CM | POA: Diagnosis not present

## 2015-05-28 DIAGNOSIS — E113393 Type 2 diabetes mellitus with moderate nonproliferative diabetic retinopathy without macular edema, bilateral: Secondary | ICD-10-CM | POA: Diagnosis not present

## 2015-05-28 DIAGNOSIS — Z794 Long term (current) use of insulin: Secondary | ICD-10-CM | POA: Diagnosis not present

## 2015-06-02 DIAGNOSIS — E782 Mixed hyperlipidemia: Secondary | ICD-10-CM | POA: Diagnosis not present

## 2015-06-02 DIAGNOSIS — E1165 Type 2 diabetes mellitus with hyperglycemia: Secondary | ICD-10-CM | POA: Diagnosis not present

## 2015-06-04 DIAGNOSIS — E1140 Type 2 diabetes mellitus with diabetic neuropathy, unspecified: Secondary | ICD-10-CM | POA: Diagnosis not present

## 2015-06-04 DIAGNOSIS — L89893 Pressure ulcer of other site, stage 3: Secondary | ICD-10-CM | POA: Diagnosis not present

## 2015-06-09 DIAGNOSIS — R42 Dizziness and giddiness: Secondary | ICD-10-CM | POA: Diagnosis not present

## 2015-06-09 DIAGNOSIS — Z7982 Long term (current) use of aspirin: Secondary | ICD-10-CM | POA: Diagnosis not present

## 2015-06-09 DIAGNOSIS — Z79899 Other long term (current) drug therapy: Secondary | ICD-10-CM | POA: Diagnosis not present

## 2015-06-09 DIAGNOSIS — Z794 Long term (current) use of insulin: Secondary | ICD-10-CM | POA: Diagnosis not present

## 2015-06-09 DIAGNOSIS — E119 Type 2 diabetes mellitus without complications: Secondary | ICD-10-CM | POA: Diagnosis not present

## 2015-06-09 DIAGNOSIS — I10 Essential (primary) hypertension: Secondary | ICD-10-CM | POA: Diagnosis not present

## 2015-06-12 DIAGNOSIS — M4004 Postural kyphosis, thoracic region: Secondary | ICD-10-CM | POA: Diagnosis not present

## 2015-06-12 DIAGNOSIS — M545 Low back pain: Secondary | ICD-10-CM | POA: Diagnosis not present

## 2015-06-12 DIAGNOSIS — M9903 Segmental and somatic dysfunction of lumbar region: Secondary | ICD-10-CM | POA: Diagnosis not present

## 2015-06-12 DIAGNOSIS — M47816 Spondylosis without myelopathy or radiculopathy, lumbar region: Secondary | ICD-10-CM | POA: Diagnosis not present

## 2015-06-12 DIAGNOSIS — M9901 Segmental and somatic dysfunction of cervical region: Secondary | ICD-10-CM | POA: Diagnosis not present

## 2015-06-12 DIAGNOSIS — S335XXA Sprain of ligaments of lumbar spine, initial encounter: Secondary | ICD-10-CM | POA: Diagnosis not present

## 2015-06-12 DIAGNOSIS — M546 Pain in thoracic spine: Secondary | ICD-10-CM | POA: Diagnosis not present

## 2015-06-12 DIAGNOSIS — M47812 Spondylosis without myelopathy or radiculopathy, cervical region: Secondary | ICD-10-CM | POA: Diagnosis not present

## 2015-06-12 DIAGNOSIS — M5136 Other intervertebral disc degeneration, lumbar region: Secondary | ICD-10-CM | POA: Diagnosis not present

## 2015-06-12 DIAGNOSIS — M9902 Segmental and somatic dysfunction of thoracic region: Secondary | ICD-10-CM | POA: Diagnosis not present

## 2015-06-18 DIAGNOSIS — E1140 Type 2 diabetes mellitus with diabetic neuropathy, unspecified: Secondary | ICD-10-CM | POA: Diagnosis not present

## 2015-06-18 DIAGNOSIS — L89893 Pressure ulcer of other site, stage 3: Secondary | ICD-10-CM | POA: Diagnosis not present

## 2015-06-19 DIAGNOSIS — R55 Syncope and collapse: Secondary | ICD-10-CM | POA: Diagnosis not present

## 2015-06-19 DIAGNOSIS — R42 Dizziness and giddiness: Secondary | ICD-10-CM | POA: Diagnosis not present

## 2015-06-19 DIAGNOSIS — I2510 Atherosclerotic heart disease of native coronary artery without angina pectoris: Secondary | ICD-10-CM | POA: Diagnosis not present

## 2015-06-19 DIAGNOSIS — I10 Essential (primary) hypertension: Secondary | ICD-10-CM | POA: Diagnosis not present

## 2015-06-19 DIAGNOSIS — E782 Mixed hyperlipidemia: Secondary | ICD-10-CM | POA: Diagnosis not present

## 2015-06-25 DIAGNOSIS — M9903 Segmental and somatic dysfunction of lumbar region: Secondary | ICD-10-CM | POA: Diagnosis not present

## 2015-06-25 DIAGNOSIS — M9901 Segmental and somatic dysfunction of cervical region: Secondary | ICD-10-CM | POA: Diagnosis not present

## 2015-06-25 DIAGNOSIS — M5136 Other intervertebral disc degeneration, lumbar region: Secondary | ICD-10-CM | POA: Diagnosis not present

## 2015-06-25 DIAGNOSIS — M47812 Spondylosis without myelopathy or radiculopathy, cervical region: Secondary | ICD-10-CM | POA: Diagnosis not present

## 2015-06-25 DIAGNOSIS — M9902 Segmental and somatic dysfunction of thoracic region: Secondary | ICD-10-CM | POA: Diagnosis not present

## 2015-06-25 DIAGNOSIS — M546 Pain in thoracic spine: Secondary | ICD-10-CM | POA: Diagnosis not present

## 2015-06-25 DIAGNOSIS — M4004 Postural kyphosis, thoracic region: Secondary | ICD-10-CM | POA: Diagnosis not present

## 2015-06-25 DIAGNOSIS — M545 Low back pain: Secondary | ICD-10-CM | POA: Diagnosis not present

## 2015-06-25 DIAGNOSIS — M47816 Spondylosis without myelopathy or radiculopathy, lumbar region: Secondary | ICD-10-CM | POA: Diagnosis not present

## 2015-06-25 DIAGNOSIS — S335XXA Sprain of ligaments of lumbar spine, initial encounter: Secondary | ICD-10-CM | POA: Diagnosis not present

## 2015-07-02 DIAGNOSIS — E1140 Type 2 diabetes mellitus with diabetic neuropathy, unspecified: Secondary | ICD-10-CM | POA: Diagnosis not present

## 2015-07-02 DIAGNOSIS — L89893 Pressure ulcer of other site, stage 3: Secondary | ICD-10-CM | POA: Diagnosis not present

## 2015-07-08 DIAGNOSIS — M47812 Spondylosis without myelopathy or radiculopathy, cervical region: Secondary | ICD-10-CM | POA: Diagnosis not present

## 2015-07-08 DIAGNOSIS — M4004 Postural kyphosis, thoracic region: Secondary | ICD-10-CM | POA: Diagnosis not present

## 2015-07-08 DIAGNOSIS — M47816 Spondylosis without myelopathy or radiculopathy, lumbar region: Secondary | ICD-10-CM | POA: Diagnosis not present

## 2015-07-08 DIAGNOSIS — M9903 Segmental and somatic dysfunction of lumbar region: Secondary | ICD-10-CM | POA: Diagnosis not present

## 2015-07-08 DIAGNOSIS — M546 Pain in thoracic spine: Secondary | ICD-10-CM | POA: Diagnosis not present

## 2015-07-08 DIAGNOSIS — M5136 Other intervertebral disc degeneration, lumbar region: Secondary | ICD-10-CM | POA: Diagnosis not present

## 2015-07-08 DIAGNOSIS — M9902 Segmental and somatic dysfunction of thoracic region: Secondary | ICD-10-CM | POA: Diagnosis not present

## 2015-07-08 DIAGNOSIS — M9901 Segmental and somatic dysfunction of cervical region: Secondary | ICD-10-CM | POA: Diagnosis not present

## 2015-07-08 DIAGNOSIS — S335XXA Sprain of ligaments of lumbar spine, initial encounter: Secondary | ICD-10-CM | POA: Diagnosis not present

## 2015-07-08 DIAGNOSIS — M545 Low back pain: Secondary | ICD-10-CM | POA: Diagnosis not present

## 2015-07-24 DIAGNOSIS — E1140 Type 2 diabetes mellitus with diabetic neuropathy, unspecified: Secondary | ICD-10-CM | POA: Diagnosis not present

## 2015-07-24 DIAGNOSIS — L89893 Pressure ulcer of other site, stage 3: Secondary | ICD-10-CM | POA: Diagnosis not present

## 2015-08-01 DIAGNOSIS — E1165 Type 2 diabetes mellitus with hyperglycemia: Secondary | ICD-10-CM | POA: Diagnosis not present

## 2015-08-01 DIAGNOSIS — I10 Essential (primary) hypertension: Secondary | ICD-10-CM | POA: Diagnosis not present

## 2015-08-01 DIAGNOSIS — E7800 Pure hypercholesterolemia, unspecified: Secondary | ICD-10-CM | POA: Diagnosis not present

## 2015-08-01 DIAGNOSIS — D519 Vitamin B12 deficiency anemia, unspecified: Secondary | ICD-10-CM | POA: Diagnosis not present

## 2015-08-05 DIAGNOSIS — M9902 Segmental and somatic dysfunction of thoracic region: Secondary | ICD-10-CM | POA: Diagnosis not present

## 2015-08-05 DIAGNOSIS — E11621 Type 2 diabetes mellitus with foot ulcer: Secondary | ICD-10-CM | POA: Diagnosis not present

## 2015-08-05 DIAGNOSIS — M9903 Segmental and somatic dysfunction of lumbar region: Secondary | ICD-10-CM | POA: Diagnosis not present

## 2015-08-05 DIAGNOSIS — M546 Pain in thoracic spine: Secondary | ICD-10-CM | POA: Diagnosis not present

## 2015-08-05 DIAGNOSIS — E1143 Type 2 diabetes mellitus with diabetic autonomic (poly)neuropathy: Secondary | ICD-10-CM | POA: Diagnosis not present

## 2015-08-05 DIAGNOSIS — M5136 Other intervertebral disc degeneration, lumbar region: Secondary | ICD-10-CM | POA: Diagnosis not present

## 2015-08-05 DIAGNOSIS — E1165 Type 2 diabetes mellitus with hyperglycemia: Secondary | ICD-10-CM | POA: Diagnosis not present

## 2015-08-05 DIAGNOSIS — M9901 Segmental and somatic dysfunction of cervical region: Secondary | ICD-10-CM | POA: Diagnosis not present

## 2015-08-05 DIAGNOSIS — M47812 Spondylosis without myelopathy or radiculopathy, cervical region: Secondary | ICD-10-CM | POA: Diagnosis not present

## 2015-08-05 DIAGNOSIS — M545 Low back pain: Secondary | ICD-10-CM | POA: Diagnosis not present

## 2015-08-05 DIAGNOSIS — E11618 Type 2 diabetes mellitus with other diabetic arthropathy: Secondary | ICD-10-CM | POA: Diagnosis not present

## 2015-08-05 DIAGNOSIS — S335XXA Sprain of ligaments of lumbar spine, initial encounter: Secondary | ICD-10-CM | POA: Diagnosis not present

## 2015-08-05 DIAGNOSIS — M47816 Spondylosis without myelopathy or radiculopathy, lumbar region: Secondary | ICD-10-CM | POA: Diagnosis not present

## 2015-08-05 DIAGNOSIS — M4004 Postural kyphosis, thoracic region: Secondary | ICD-10-CM | POA: Diagnosis not present

## 2015-08-14 DIAGNOSIS — E1140 Type 2 diabetes mellitus with diabetic neuropathy, unspecified: Secondary | ICD-10-CM | POA: Diagnosis not present

## 2015-08-14 DIAGNOSIS — L89893 Pressure ulcer of other site, stage 3: Secondary | ICD-10-CM | POA: Diagnosis not present

## 2015-08-14 DIAGNOSIS — I10 Essential (primary) hypertension: Secondary | ICD-10-CM | POA: Diagnosis not present

## 2015-08-26 DIAGNOSIS — I2510 Atherosclerotic heart disease of native coronary artery without angina pectoris: Secondary | ICD-10-CM | POA: Diagnosis not present

## 2015-08-26 DIAGNOSIS — M205X1 Other deformities of toe(s) (acquired), right foot: Secondary | ICD-10-CM | POA: Diagnosis not present

## 2015-08-26 DIAGNOSIS — L97519 Non-pressure chronic ulcer of other part of right foot with unspecified severity: Secondary | ICD-10-CM | POA: Diagnosis not present

## 2015-09-01 ENCOUNTER — Other Ambulatory Visit: Payer: Self-pay

## 2015-09-01 NOTE — Patient Outreach (Signed)
[facility]) Care Management  09/01/2015  Larry Hart Feb 10, 1946 [id number]     Received call transfer from Loiza assistant(Larry Hart). Patient with several questions or concerns. States that he feels that [facility] services is "a scam" as he doesn't know how we can have provide services free. 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 RN CM spoke at length with patient in regards to [facility] services as patient had several questions and concerns regarding program. Patient very concerned that this will interfere with his PCP. Advised patient that [facility] does not replace MD office care but instead works in partnership with MD office to provide care coordination services. Patient voices some concerns that his cardiologist office(Dr. Clevenger) closed and/or moved. After much discussion patient was agreeable to completing screening call at a later date/time. He states he has several MD appts this week and is only available tomorrow afternoon. Otherwise, he requested call on his cell phone next week.   Plan: RN CM notified [facility] administrative assistant of patient request.   Hetty Blend [place] Management Telephonic Care Management Coordinator Direct Phone: [phone] Toll Free: [phone] Fax: [phone]

## 2015-09-03 DIAGNOSIS — E782 Mixed hyperlipidemia: Secondary | ICD-10-CM | POA: Diagnosis not present

## 2015-09-03 DIAGNOSIS — E1165 Type 2 diabetes mellitus with hyperglycemia: Secondary | ICD-10-CM | POA: Diagnosis not present

## 2015-09-04 DIAGNOSIS — E1140 Type 2 diabetes mellitus with diabetic neuropathy, unspecified: Secondary | ICD-10-CM | POA: Diagnosis not present

## 2015-09-04 DIAGNOSIS — L89893 Pressure ulcer of other site, stage 3: Secondary | ICD-10-CM | POA: Diagnosis not present

## 2015-09-05 DIAGNOSIS — I25119 Atherosclerotic heart disease of native coronary artery with unspecified angina pectoris: Secondary | ICD-10-CM | POA: Diagnosis not present

## 2015-09-05 DIAGNOSIS — Z9109 Other allergy status, other than to drugs and biological substances: Secondary | ICD-10-CM | POA: Diagnosis not present

## 2015-09-05 DIAGNOSIS — L97519 Non-pressure chronic ulcer of other part of right foot with unspecified severity: Secondary | ICD-10-CM | POA: Diagnosis not present

## 2015-09-05 DIAGNOSIS — M205X1 Other deformities of toe(s) (acquired), right foot: Secondary | ICD-10-CM | POA: Diagnosis not present

## 2015-09-05 DIAGNOSIS — E10621 Type 1 diabetes mellitus with foot ulcer: Secondary | ICD-10-CM | POA: Diagnosis not present

## 2015-09-05 DIAGNOSIS — E669 Obesity, unspecified: Secondary | ICD-10-CM | POA: Diagnosis not present

## 2015-09-05 DIAGNOSIS — Z794 Long term (current) use of insulin: Secondary | ICD-10-CM | POA: Diagnosis not present

## 2015-09-05 DIAGNOSIS — G4730 Sleep apnea, unspecified: Secondary | ICD-10-CM | POA: Diagnosis not present

## 2015-09-05 DIAGNOSIS — Z888 Allergy status to other drugs, medicaments and biological substances status: Secondary | ICD-10-CM | POA: Diagnosis not present

## 2015-09-05 DIAGNOSIS — Z951 Presence of aortocoronary bypass graft: Secondary | ICD-10-CM | POA: Diagnosis not present

## 2015-09-05 DIAGNOSIS — Z79899 Other long term (current) drug therapy: Secondary | ICD-10-CM | POA: Diagnosis not present

## 2015-09-05 DIAGNOSIS — Z955 Presence of coronary angioplasty implant and graft: Secondary | ICD-10-CM | POA: Diagnosis not present

## 2015-09-05 DIAGNOSIS — Z6829 Body mass index (BMI) 29.0-29.9, adult: Secondary | ICD-10-CM | POA: Diagnosis not present

## 2015-09-05 DIAGNOSIS — G4733 Obstructive sleep apnea (adult) (pediatric): Secondary | ICD-10-CM | POA: Diagnosis not present

## 2015-09-05 DIAGNOSIS — I10 Essential (primary) hypertension: Secondary | ICD-10-CM | POA: Diagnosis not present

## 2015-09-05 DIAGNOSIS — E785 Hyperlipidemia, unspecified: Secondary | ICD-10-CM | POA: Diagnosis not present

## 2015-09-05 DIAGNOSIS — Z91030 Bee allergy status: Secondary | ICD-10-CM | POA: Diagnosis not present

## 2015-09-05 DIAGNOSIS — K219 Gastro-esophageal reflux disease without esophagitis: Secondary | ICD-10-CM | POA: Diagnosis not present

## 2015-09-26 DIAGNOSIS — E1140 Type 2 diabetes mellitus with diabetic neuropathy, unspecified: Secondary | ICD-10-CM | POA: Diagnosis not present

## 2015-09-26 DIAGNOSIS — B351 Tinea unguium: Secondary | ICD-10-CM | POA: Diagnosis not present

## 2015-10-01 DIAGNOSIS — Z4889 Encounter for other specified surgical aftercare: Secondary | ICD-10-CM | POA: Diagnosis not present

## 2015-10-03 DIAGNOSIS — L0291 Cutaneous abscess, unspecified: Secondary | ICD-10-CM | POA: Diagnosis not present

## 2015-10-07 DIAGNOSIS — L0291 Cutaneous abscess, unspecified: Secondary | ICD-10-CM | POA: Diagnosis not present

## 2015-10-27 DIAGNOSIS — Z4889 Encounter for other specified surgical aftercare: Secondary | ICD-10-CM | POA: Diagnosis not present

## 2015-11-03 DIAGNOSIS — L723 Sebaceous cyst: Secondary | ICD-10-CM | POA: Diagnosis not present

## 2015-11-05 DIAGNOSIS — L570 Actinic keratosis: Secondary | ICD-10-CM | POA: Diagnosis not present

## 2015-11-05 DIAGNOSIS — Z85828 Personal history of other malignant neoplasm of skin: Secondary | ICD-10-CM | POA: Diagnosis not present

## 2015-11-05 DIAGNOSIS — L219 Seborrheic dermatitis, unspecified: Secondary | ICD-10-CM | POA: Diagnosis not present

## 2015-11-12 DIAGNOSIS — Z91030 Bee allergy status: Secondary | ICD-10-CM | POA: Diagnosis not present

## 2015-11-12 DIAGNOSIS — Z7982 Long term (current) use of aspirin: Secondary | ICD-10-CM | POA: Diagnosis not present

## 2015-11-12 DIAGNOSIS — L723 Sebaceous cyst: Secondary | ICD-10-CM | POA: Diagnosis not present

## 2015-11-12 DIAGNOSIS — Z9049 Acquired absence of other specified parts of digestive tract: Secondary | ICD-10-CM | POA: Diagnosis not present

## 2015-11-12 DIAGNOSIS — E119 Type 2 diabetes mellitus without complications: Secondary | ICD-10-CM | POA: Diagnosis not present

## 2015-11-12 DIAGNOSIS — Z888 Allergy status to other drugs, medicaments and biological substances status: Secondary | ICD-10-CM | POA: Diagnosis not present

## 2015-11-12 DIAGNOSIS — F329 Major depressive disorder, single episode, unspecified: Secondary | ICD-10-CM | POA: Diagnosis not present

## 2015-11-12 DIAGNOSIS — Z825 Family history of asthma and other chronic lower respiratory diseases: Secondary | ICD-10-CM | POA: Diagnosis not present

## 2015-11-12 DIAGNOSIS — Z836 Family history of other diseases of the respiratory system: Secondary | ICD-10-CM | POA: Diagnosis not present

## 2015-11-12 DIAGNOSIS — G4730 Sleep apnea, unspecified: Secondary | ICD-10-CM | POA: Diagnosis not present

## 2015-11-12 DIAGNOSIS — Z8249 Family history of ischemic heart disease and other diseases of the circulatory system: Secondary | ICD-10-CM | POA: Diagnosis not present

## 2015-11-12 DIAGNOSIS — I10 Essential (primary) hypertension: Secondary | ICD-10-CM | POA: Diagnosis not present

## 2015-11-12 DIAGNOSIS — E039 Hypothyroidism, unspecified: Secondary | ICD-10-CM | POA: Diagnosis not present

## 2015-11-12 DIAGNOSIS — Z9989 Dependence on other enabling machines and devices: Secondary | ICD-10-CM | POA: Diagnosis not present

## 2015-11-12 DIAGNOSIS — Z8371 Family history of colonic polyps: Secondary | ICD-10-CM | POA: Diagnosis not present

## 2015-11-12 DIAGNOSIS — Z8489 Family history of other specified conditions: Secondary | ICD-10-CM | POA: Diagnosis not present

## 2015-11-12 DIAGNOSIS — Z79899 Other long term (current) drug therapy: Secondary | ICD-10-CM | POA: Diagnosis not present

## 2015-11-12 DIAGNOSIS — L720 Epidermal cyst: Secondary | ICD-10-CM | POA: Diagnosis not present

## 2015-11-12 DIAGNOSIS — Z800 Family history of malignant neoplasm of digestive organs: Secondary | ICD-10-CM | POA: Diagnosis not present

## 2015-11-18 DIAGNOSIS — Z4789 Encounter for other orthopedic aftercare: Secondary | ICD-10-CM | POA: Diagnosis not present

## 2015-12-04 DIAGNOSIS — E1140 Type 2 diabetes mellitus with diabetic neuropathy, unspecified: Secondary | ICD-10-CM | POA: Diagnosis not present

## 2015-12-04 DIAGNOSIS — E1151 Type 2 diabetes mellitus with diabetic peripheral angiopathy without gangrene: Secondary | ICD-10-CM | POA: Diagnosis not present

## 2015-12-04 DIAGNOSIS — B351 Tinea unguium: Secondary | ICD-10-CM | POA: Diagnosis not present

## 2015-12-05 DIAGNOSIS — E1165 Type 2 diabetes mellitus with hyperglycemia: Secondary | ICD-10-CM | POA: Diagnosis not present

## 2015-12-05 DIAGNOSIS — E782 Mixed hyperlipidemia: Secondary | ICD-10-CM | POA: Diagnosis not present

## 2015-12-31 DIAGNOSIS — M6281 Muscle weakness (generalized): Secondary | ICD-10-CM | POA: Diagnosis not present

## 2015-12-31 DIAGNOSIS — R262 Difficulty in walking, not elsewhere classified: Secondary | ICD-10-CM | POA: Diagnosis not present

## 2015-12-31 DIAGNOSIS — E1140 Type 2 diabetes mellitus with diabetic neuropathy, unspecified: Secondary | ICD-10-CM | POA: Diagnosis not present

## 2016-01-06 DIAGNOSIS — R262 Difficulty in walking, not elsewhere classified: Secondary | ICD-10-CM | POA: Diagnosis not present

## 2016-01-06 DIAGNOSIS — M6281 Muscle weakness (generalized): Secondary | ICD-10-CM | POA: Diagnosis not present

## 2016-01-06 DIAGNOSIS — E1140 Type 2 diabetes mellitus with diabetic neuropathy, unspecified: Secondary | ICD-10-CM | POA: Diagnosis not present

## 2016-01-08 DIAGNOSIS — R262 Difficulty in walking, not elsewhere classified: Secondary | ICD-10-CM | POA: Diagnosis not present

## 2016-01-08 DIAGNOSIS — M6281 Muscle weakness (generalized): Secondary | ICD-10-CM | POA: Diagnosis not present

## 2016-01-08 DIAGNOSIS — E1140 Type 2 diabetes mellitus with diabetic neuropathy, unspecified: Secondary | ICD-10-CM | POA: Diagnosis not present

## 2016-01-13 DIAGNOSIS — M6281 Muscle weakness (generalized): Secondary | ICD-10-CM | POA: Diagnosis not present

## 2016-01-13 DIAGNOSIS — R262 Difficulty in walking, not elsewhere classified: Secondary | ICD-10-CM | POA: Diagnosis not present

## 2016-01-13 DIAGNOSIS — E1140 Type 2 diabetes mellitus with diabetic neuropathy, unspecified: Secondary | ICD-10-CM | POA: Diagnosis not present

## 2016-01-15 DIAGNOSIS — M6281 Muscle weakness (generalized): Secondary | ICD-10-CM | POA: Diagnosis not present

## 2016-01-15 DIAGNOSIS — R262 Difficulty in walking, not elsewhere classified: Secondary | ICD-10-CM | POA: Diagnosis not present

## 2016-01-15 DIAGNOSIS — E1140 Type 2 diabetes mellitus with diabetic neuropathy, unspecified: Secondary | ICD-10-CM | POA: Diagnosis not present

## 2016-01-20 DIAGNOSIS — E1140 Type 2 diabetes mellitus with diabetic neuropathy, unspecified: Secondary | ICD-10-CM | POA: Diagnosis not present

## 2016-01-20 DIAGNOSIS — M6281 Muscle weakness (generalized): Secondary | ICD-10-CM | POA: Diagnosis not present

## 2016-01-20 DIAGNOSIS — R262 Difficulty in walking, not elsewhere classified: Secondary | ICD-10-CM | POA: Diagnosis not present

## 2016-01-22 DIAGNOSIS — E1140 Type 2 diabetes mellitus with diabetic neuropathy, unspecified: Secondary | ICD-10-CM | POA: Diagnosis not present

## 2016-01-22 DIAGNOSIS — R262 Difficulty in walking, not elsewhere classified: Secondary | ICD-10-CM | POA: Diagnosis not present

## 2016-01-22 DIAGNOSIS — M6281 Muscle weakness (generalized): Secondary | ICD-10-CM | POA: Diagnosis not present

## 2016-01-26 DIAGNOSIS — R262 Difficulty in walking, not elsewhere classified: Secondary | ICD-10-CM | POA: Diagnosis not present

## 2016-01-26 DIAGNOSIS — E1140 Type 2 diabetes mellitus with diabetic neuropathy, unspecified: Secondary | ICD-10-CM | POA: Diagnosis not present

## 2016-01-26 DIAGNOSIS — M6281 Muscle weakness (generalized): Secondary | ICD-10-CM | POA: Diagnosis not present

## 2016-01-28 DIAGNOSIS — E1140 Type 2 diabetes mellitus with diabetic neuropathy, unspecified: Secondary | ICD-10-CM | POA: Diagnosis not present

## 2016-01-28 DIAGNOSIS — M6281 Muscle weakness (generalized): Secondary | ICD-10-CM | POA: Diagnosis not present

## 2016-01-28 DIAGNOSIS — R262 Difficulty in walking, not elsewhere classified: Secondary | ICD-10-CM | POA: Diagnosis not present

## 2016-02-03 DIAGNOSIS — E1140 Type 2 diabetes mellitus with diabetic neuropathy, unspecified: Secondary | ICD-10-CM | POA: Diagnosis not present

## 2016-02-03 DIAGNOSIS — M6281 Muscle weakness (generalized): Secondary | ICD-10-CM | POA: Diagnosis not present

## 2016-02-03 DIAGNOSIS — R262 Difficulty in walking, not elsewhere classified: Secondary | ICD-10-CM | POA: Diagnosis not present

## 2016-02-05 DIAGNOSIS — Z23 Encounter for immunization: Secondary | ICD-10-CM | POA: Diagnosis not present

## 2016-02-05 DIAGNOSIS — E11621 Type 2 diabetes mellitus with foot ulcer: Secondary | ICD-10-CM | POA: Diagnosis not present

## 2016-02-10 DIAGNOSIS — M6281 Muscle weakness (generalized): Secondary | ICD-10-CM | POA: Diagnosis not present

## 2016-02-10 DIAGNOSIS — E1140 Type 2 diabetes mellitus with diabetic neuropathy, unspecified: Secondary | ICD-10-CM | POA: Diagnosis not present

## 2016-02-10 DIAGNOSIS — R262 Difficulty in walking, not elsewhere classified: Secondary | ICD-10-CM | POA: Diagnosis not present

## 2016-02-12 DIAGNOSIS — E1140 Type 2 diabetes mellitus with diabetic neuropathy, unspecified: Secondary | ICD-10-CM | POA: Diagnosis not present

## 2016-02-12 DIAGNOSIS — R262 Difficulty in walking, not elsewhere classified: Secondary | ICD-10-CM | POA: Diagnosis not present

## 2016-02-12 DIAGNOSIS — B351 Tinea unguium: Secondary | ICD-10-CM | POA: Diagnosis not present

## 2016-02-12 DIAGNOSIS — M6281 Muscle weakness (generalized): Secondary | ICD-10-CM | POA: Diagnosis not present

## 2016-02-12 DIAGNOSIS — E1151 Type 2 diabetes mellitus with diabetic peripheral angiopathy without gangrene: Secondary | ICD-10-CM | POA: Diagnosis not present

## 2016-02-17 DIAGNOSIS — M6281 Muscle weakness (generalized): Secondary | ICD-10-CM | POA: Diagnosis not present

## 2016-02-17 DIAGNOSIS — R262 Difficulty in walking, not elsewhere classified: Secondary | ICD-10-CM | POA: Diagnosis not present

## 2016-02-17 DIAGNOSIS — E1140 Type 2 diabetes mellitus with diabetic neuropathy, unspecified: Secondary | ICD-10-CM | POA: Diagnosis not present

## 2016-02-19 DIAGNOSIS — E1140 Type 2 diabetes mellitus with diabetic neuropathy, unspecified: Secondary | ICD-10-CM | POA: Diagnosis not present

## 2016-02-19 DIAGNOSIS — R262 Difficulty in walking, not elsewhere classified: Secondary | ICD-10-CM | POA: Diagnosis not present

## 2016-02-19 DIAGNOSIS — M6281 Muscle weakness (generalized): Secondary | ICD-10-CM | POA: Diagnosis not present

## 2016-02-24 DIAGNOSIS — M6281 Muscle weakness (generalized): Secondary | ICD-10-CM | POA: Diagnosis not present

## 2016-02-24 DIAGNOSIS — E1140 Type 2 diabetes mellitus with diabetic neuropathy, unspecified: Secondary | ICD-10-CM | POA: Diagnosis not present

## 2016-02-24 DIAGNOSIS — R262 Difficulty in walking, not elsewhere classified: Secondary | ICD-10-CM | POA: Diagnosis not present

## 2016-02-26 DIAGNOSIS — E1140 Type 2 diabetes mellitus with diabetic neuropathy, unspecified: Secondary | ICD-10-CM | POA: Diagnosis not present

## 2016-02-26 DIAGNOSIS — R262 Difficulty in walking, not elsewhere classified: Secondary | ICD-10-CM | POA: Diagnosis not present

## 2016-02-26 DIAGNOSIS — M6281 Muscle weakness (generalized): Secondary | ICD-10-CM | POA: Diagnosis not present

## 2016-03-02 DIAGNOSIS — M6281 Muscle weakness (generalized): Secondary | ICD-10-CM | POA: Diagnosis not present

## 2016-03-02 DIAGNOSIS — E1140 Type 2 diabetes mellitus with diabetic neuropathy, unspecified: Secondary | ICD-10-CM | POA: Diagnosis not present

## 2016-03-02 DIAGNOSIS — R262 Difficulty in walking, not elsewhere classified: Secondary | ICD-10-CM | POA: Diagnosis not present

## 2016-03-08 DIAGNOSIS — E1165 Type 2 diabetes mellitus with hyperglycemia: Secondary | ICD-10-CM | POA: Diagnosis not present

## 2016-03-08 DIAGNOSIS — E782 Mixed hyperlipidemia: Secondary | ICD-10-CM | POA: Diagnosis not present

## 2016-03-09 DIAGNOSIS — M6281 Muscle weakness (generalized): Secondary | ICD-10-CM | POA: Diagnosis not present

## 2016-03-09 DIAGNOSIS — R262 Difficulty in walking, not elsewhere classified: Secondary | ICD-10-CM | POA: Diagnosis not present

## 2016-03-09 DIAGNOSIS — E1140 Type 2 diabetes mellitus with diabetic neuropathy, unspecified: Secondary | ICD-10-CM | POA: Diagnosis not present

## 2016-03-25 DIAGNOSIS — E113313 Type 2 diabetes mellitus with moderate nonproliferative diabetic retinopathy with macular edema, bilateral: Secondary | ICD-10-CM | POA: Diagnosis not present

## 2016-03-31 DIAGNOSIS — E1165 Type 2 diabetes mellitus with hyperglycemia: Secondary | ICD-10-CM | POA: Diagnosis not present

## 2016-03-31 DIAGNOSIS — E1143 Type 2 diabetes mellitus with diabetic autonomic (poly)neuropathy: Secondary | ICD-10-CM | POA: Diagnosis not present

## 2016-03-31 DIAGNOSIS — J019 Acute sinusitis, unspecified: Secondary | ICD-10-CM | POA: Diagnosis not present

## 2016-03-31 DIAGNOSIS — Z6831 Body mass index (BMI) 31.0-31.9, adult: Secondary | ICD-10-CM | POA: Diagnosis not present

## 2016-04-01 DIAGNOSIS — I25118 Atherosclerotic heart disease of native coronary artery with other forms of angina pectoris: Secondary | ICD-10-CM | POA: Diagnosis present

## 2016-04-01 DIAGNOSIS — I10 Essential (primary) hypertension: Secondary | ICD-10-CM | POA: Diagnosis not present

## 2016-04-01 DIAGNOSIS — R509 Fever, unspecified: Secondary | ICD-10-CM | POA: Diagnosis not present

## 2016-04-01 DIAGNOSIS — G4730 Sleep apnea, unspecified: Secondary | ICD-10-CM | POA: Diagnosis present

## 2016-04-01 DIAGNOSIS — E11621 Type 2 diabetes mellitus with foot ulcer: Secondary | ICD-10-CM | POA: Diagnosis not present

## 2016-04-01 DIAGNOSIS — I129 Hypertensive chronic kidney disease with stage 1 through stage 4 chronic kidney disease, or unspecified chronic kidney disease: Secondary | ICD-10-CM | POA: Diagnosis present

## 2016-04-01 DIAGNOSIS — Z888 Allergy status to other drugs, medicaments and biological substances status: Secondary | ICD-10-CM | POA: Diagnosis not present

## 2016-04-01 DIAGNOSIS — E785 Hyperlipidemia, unspecified: Secondary | ICD-10-CM | POA: Diagnosis present

## 2016-04-01 DIAGNOSIS — L02611 Cutaneous abscess of right foot: Secondary | ICD-10-CM | POA: Diagnosis present

## 2016-04-01 DIAGNOSIS — Z85820 Personal history of malignant melanoma of skin: Secondary | ICD-10-CM | POA: Diagnosis not present

## 2016-04-01 DIAGNOSIS — I252 Old myocardial infarction: Secondary | ICD-10-CM | POA: Diagnosis not present

## 2016-04-01 DIAGNOSIS — E119 Type 2 diabetes mellitus without complications: Secondary | ICD-10-CM | POA: Diagnosis not present

## 2016-04-01 DIAGNOSIS — L03115 Cellulitis of right lower limb: Secondary | ICD-10-CM | POA: Diagnosis not present

## 2016-04-01 DIAGNOSIS — I2510 Atherosclerotic heart disease of native coronary artery without angina pectoris: Secondary | ICD-10-CM | POA: Diagnosis not present

## 2016-04-01 DIAGNOSIS — E11628 Type 2 diabetes mellitus with other skin complications: Secondary | ICD-10-CM | POA: Diagnosis present

## 2016-04-01 DIAGNOSIS — E1122 Type 2 diabetes mellitus with diabetic chronic kidney disease: Secondary | ICD-10-CM | POA: Diagnosis not present

## 2016-04-01 DIAGNOSIS — E079 Disorder of thyroid, unspecified: Secondary | ICD-10-CM | POA: Diagnosis present

## 2016-04-01 DIAGNOSIS — L0390 Cellulitis, unspecified: Secondary | ICD-10-CM | POA: Diagnosis not present

## 2016-04-01 DIAGNOSIS — I350 Nonrheumatic aortic (valve) stenosis: Secondary | ICD-10-CM | POA: Diagnosis present

## 2016-04-01 DIAGNOSIS — Z89422 Acquired absence of other left toe(s): Secondary | ICD-10-CM | POA: Diagnosis not present

## 2016-04-01 DIAGNOSIS — Z9889 Other specified postprocedural states: Secondary | ICD-10-CM | POA: Diagnosis not present

## 2016-04-01 DIAGNOSIS — Z794 Long term (current) use of insulin: Secondary | ICD-10-CM | POA: Diagnosis not present

## 2016-04-01 DIAGNOSIS — E1151 Type 2 diabetes mellitus with diabetic peripheral angiopathy without gangrene: Secondary | ICD-10-CM | POA: Diagnosis not present

## 2016-04-01 DIAGNOSIS — L089 Local infection of the skin and subcutaneous tissue, unspecified: Secondary | ICD-10-CM | POA: Diagnosis not present

## 2016-04-01 DIAGNOSIS — I771 Stricture of artery: Secondary | ICD-10-CM | POA: Diagnosis present

## 2016-04-01 DIAGNOSIS — Z91030 Bee allergy status: Secondary | ICD-10-CM | POA: Diagnosis not present

## 2016-04-01 DIAGNOSIS — E669 Obesity, unspecified: Secondary | ICD-10-CM | POA: Diagnosis present

## 2016-04-01 DIAGNOSIS — Z6829 Body mass index (BMI) 29.0-29.9, adult: Secondary | ICD-10-CM | POA: Diagnosis not present

## 2016-04-01 DIAGNOSIS — R918 Other nonspecific abnormal finding of lung field: Secondary | ICD-10-CM | POA: Diagnosis not present

## 2016-04-01 DIAGNOSIS — L97509 Non-pressure chronic ulcer of other part of unspecified foot with unspecified severity: Secondary | ICD-10-CM | POA: Diagnosis not present

## 2016-04-01 DIAGNOSIS — R110 Nausea: Secondary | ICD-10-CM | POA: Diagnosis not present

## 2016-04-01 DIAGNOSIS — E1165 Type 2 diabetes mellitus with hyperglycemia: Secondary | ICD-10-CM | POA: Diagnosis present

## 2016-04-01 DIAGNOSIS — M7989 Other specified soft tissue disorders: Secondary | ICD-10-CM | POA: Diagnosis not present

## 2016-04-01 DIAGNOSIS — B9561 Methicillin susceptible Staphylococcus aureus infection as the cause of diseases classified elsewhere: Secondary | ICD-10-CM | POA: Diagnosis present

## 2016-04-01 DIAGNOSIS — N189 Chronic kidney disease, unspecified: Secondary | ICD-10-CM | POA: Diagnosis present

## 2016-04-01 DIAGNOSIS — S90851A Superficial foreign body, right foot, initial encounter: Secondary | ICD-10-CM | POA: Diagnosis present

## 2016-04-01 DIAGNOSIS — I739 Peripheral vascular disease, unspecified: Secondary | ICD-10-CM | POA: Diagnosis not present

## 2016-04-01 DIAGNOSIS — M795 Residual foreign body in soft tissue: Secondary | ICD-10-CM | POA: Diagnosis not present

## 2016-04-08 DIAGNOSIS — N189 Chronic kidney disease, unspecified: Secondary | ICD-10-CM | POA: Diagnosis not present

## 2016-04-08 DIAGNOSIS — L03115 Cellulitis of right lower limb: Secondary | ICD-10-CM | POA: Diagnosis not present

## 2016-04-08 DIAGNOSIS — E1151 Type 2 diabetes mellitus with diabetic peripheral angiopathy without gangrene: Secondary | ICD-10-CM | POA: Diagnosis not present

## 2016-04-08 DIAGNOSIS — E1122 Type 2 diabetes mellitus with diabetic chronic kidney disease: Secondary | ICD-10-CM | POA: Diagnosis not present

## 2016-04-08 DIAGNOSIS — I129 Hypertensive chronic kidney disease with stage 1 through stage 4 chronic kidney disease, or unspecified chronic kidney disease: Secondary | ICD-10-CM | POA: Diagnosis not present

## 2016-04-08 DIAGNOSIS — T814XXA Infection following a procedure, initial encounter: Secondary | ICD-10-CM | POA: Diagnosis not present

## 2016-04-09 DIAGNOSIS — T814XXA Infection following a procedure, initial encounter: Secondary | ICD-10-CM | POA: Diagnosis not present

## 2016-04-09 DIAGNOSIS — I129 Hypertensive chronic kidney disease with stage 1 through stage 4 chronic kidney disease, or unspecified chronic kidney disease: Secondary | ICD-10-CM | POA: Diagnosis not present

## 2016-04-09 DIAGNOSIS — L03115 Cellulitis of right lower limb: Secondary | ICD-10-CM | POA: Diagnosis not present

## 2016-04-09 DIAGNOSIS — E1122 Type 2 diabetes mellitus with diabetic chronic kidney disease: Secondary | ICD-10-CM | POA: Diagnosis not present

## 2016-04-09 DIAGNOSIS — N189 Chronic kidney disease, unspecified: Secondary | ICD-10-CM | POA: Diagnosis not present

## 2016-04-09 DIAGNOSIS — E1151 Type 2 diabetes mellitus with diabetic peripheral angiopathy without gangrene: Secondary | ICD-10-CM | POA: Diagnosis not present

## 2016-04-10 DIAGNOSIS — I129 Hypertensive chronic kidney disease with stage 1 through stage 4 chronic kidney disease, or unspecified chronic kidney disease: Secondary | ICD-10-CM | POA: Diagnosis not present

## 2016-04-10 DIAGNOSIS — T814XXA Infection following a procedure, initial encounter: Secondary | ICD-10-CM | POA: Diagnosis not present

## 2016-04-10 DIAGNOSIS — E1151 Type 2 diabetes mellitus with diabetic peripheral angiopathy without gangrene: Secondary | ICD-10-CM | POA: Diagnosis not present

## 2016-04-10 DIAGNOSIS — N189 Chronic kidney disease, unspecified: Secondary | ICD-10-CM | POA: Diagnosis not present

## 2016-04-10 DIAGNOSIS — L03115 Cellulitis of right lower limb: Secondary | ICD-10-CM | POA: Diagnosis not present

## 2016-04-10 DIAGNOSIS — E1122 Type 2 diabetes mellitus with diabetic chronic kidney disease: Secondary | ICD-10-CM | POA: Diagnosis not present

## 2016-04-13 DIAGNOSIS — E1122 Type 2 diabetes mellitus with diabetic chronic kidney disease: Secondary | ICD-10-CM | POA: Diagnosis not present

## 2016-04-13 DIAGNOSIS — T814XXA Infection following a procedure, initial encounter: Secondary | ICD-10-CM | POA: Diagnosis not present

## 2016-04-13 DIAGNOSIS — L03115 Cellulitis of right lower limb: Secondary | ICD-10-CM | POA: Diagnosis not present

## 2016-04-13 DIAGNOSIS — N189 Chronic kidney disease, unspecified: Secondary | ICD-10-CM | POA: Diagnosis not present

## 2016-04-13 DIAGNOSIS — E1151 Type 2 diabetes mellitus with diabetic peripheral angiopathy without gangrene: Secondary | ICD-10-CM | POA: Diagnosis not present

## 2016-04-13 DIAGNOSIS — I129 Hypertensive chronic kidney disease with stage 1 through stage 4 chronic kidney disease, or unspecified chronic kidney disease: Secondary | ICD-10-CM | POA: Diagnosis not present

## 2016-04-15 DIAGNOSIS — E1142 Type 2 diabetes mellitus with diabetic polyneuropathy: Secondary | ICD-10-CM | POA: Diagnosis not present

## 2016-04-15 DIAGNOSIS — I10 Essential (primary) hypertension: Secondary | ICD-10-CM | POA: Diagnosis not present

## 2016-04-15 DIAGNOSIS — L97413 Non-pressure chronic ulcer of right heel and midfoot with necrosis of muscle: Secondary | ICD-10-CM | POA: Diagnosis not present

## 2016-04-15 DIAGNOSIS — L84 Corns and callosities: Secondary | ICD-10-CM | POA: Diagnosis not present

## 2016-04-15 DIAGNOSIS — Z87821 Personal history of retained foreign body fully removed: Secondary | ICD-10-CM | POA: Diagnosis not present

## 2016-04-15 DIAGNOSIS — L089 Local infection of the skin and subcutaneous tissue, unspecified: Secondary | ICD-10-CM | POA: Diagnosis not present

## 2016-04-15 DIAGNOSIS — E11621 Type 2 diabetes mellitus with foot ulcer: Secondary | ICD-10-CM | POA: Diagnosis not present

## 2016-04-15 DIAGNOSIS — Z6828 Body mass index (BMI) 28.0-28.9, adult: Secondary | ICD-10-CM | POA: Diagnosis not present

## 2016-04-15 DIAGNOSIS — Z951 Presence of aortocoronary bypass graft: Secondary | ICD-10-CM | POA: Diagnosis not present

## 2016-04-15 DIAGNOSIS — E1169 Type 2 diabetes mellitus with other specified complication: Secondary | ICD-10-CM | POA: Diagnosis not present

## 2016-04-15 DIAGNOSIS — Z794 Long term (current) use of insulin: Secondary | ICD-10-CM | POA: Diagnosis not present

## 2016-04-15 DIAGNOSIS — I25118 Atherosclerotic heart disease of native coronary artery with other forms of angina pectoris: Secondary | ICD-10-CM | POA: Diagnosis not present

## 2016-04-15 DIAGNOSIS — E1151 Type 2 diabetes mellitus with diabetic peripheral angiopathy without gangrene: Secondary | ICD-10-CM | POA: Diagnosis not present

## 2016-04-16 DIAGNOSIS — I129 Hypertensive chronic kidney disease with stage 1 through stage 4 chronic kidney disease, or unspecified chronic kidney disease: Secondary | ICD-10-CM | POA: Diagnosis not present

## 2016-04-16 DIAGNOSIS — L03115 Cellulitis of right lower limb: Secondary | ICD-10-CM | POA: Diagnosis not present

## 2016-04-16 DIAGNOSIS — T814XXA Infection following a procedure, initial encounter: Secondary | ICD-10-CM | POA: Diagnosis not present

## 2016-04-16 DIAGNOSIS — N189 Chronic kidney disease, unspecified: Secondary | ICD-10-CM | POA: Diagnosis not present

## 2016-04-16 DIAGNOSIS — E1122 Type 2 diabetes mellitus with diabetic chronic kidney disease: Secondary | ICD-10-CM | POA: Diagnosis not present

## 2016-04-16 DIAGNOSIS — E1151 Type 2 diabetes mellitus with diabetic peripheral angiopathy without gangrene: Secondary | ICD-10-CM | POA: Diagnosis not present

## 2016-04-20 DIAGNOSIS — L03115 Cellulitis of right lower limb: Secondary | ICD-10-CM | POA: Diagnosis not present

## 2016-04-20 DIAGNOSIS — E1122 Type 2 diabetes mellitus with diabetic chronic kidney disease: Secondary | ICD-10-CM | POA: Diagnosis not present

## 2016-04-20 DIAGNOSIS — N189 Chronic kidney disease, unspecified: Secondary | ICD-10-CM | POA: Diagnosis not present

## 2016-04-20 DIAGNOSIS — I129 Hypertensive chronic kidney disease with stage 1 through stage 4 chronic kidney disease, or unspecified chronic kidney disease: Secondary | ICD-10-CM | POA: Diagnosis not present

## 2016-04-20 DIAGNOSIS — T814XXA Infection following a procedure, initial encounter: Secondary | ICD-10-CM | POA: Diagnosis not present

## 2016-04-20 DIAGNOSIS — E1151 Type 2 diabetes mellitus with diabetic peripheral angiopathy without gangrene: Secondary | ICD-10-CM | POA: Diagnosis not present

## 2016-04-22 DIAGNOSIS — L89893 Pressure ulcer of other site, stage 3: Secondary | ICD-10-CM | POA: Diagnosis not present

## 2016-04-22 DIAGNOSIS — B351 Tinea unguium: Secondary | ICD-10-CM | POA: Diagnosis not present

## 2016-04-22 DIAGNOSIS — E1140 Type 2 diabetes mellitus with diabetic neuropathy, unspecified: Secondary | ICD-10-CM | POA: Diagnosis not present

## 2016-04-23 DIAGNOSIS — E1122 Type 2 diabetes mellitus with diabetic chronic kidney disease: Secondary | ICD-10-CM | POA: Diagnosis not present

## 2016-04-23 DIAGNOSIS — T814XXA Infection following a procedure, initial encounter: Secondary | ICD-10-CM | POA: Diagnosis not present

## 2016-04-23 DIAGNOSIS — I129 Hypertensive chronic kidney disease with stage 1 through stage 4 chronic kidney disease, or unspecified chronic kidney disease: Secondary | ICD-10-CM | POA: Diagnosis not present

## 2016-04-23 DIAGNOSIS — E1151 Type 2 diabetes mellitus with diabetic peripheral angiopathy without gangrene: Secondary | ICD-10-CM | POA: Diagnosis not present

## 2016-04-23 DIAGNOSIS — L03115 Cellulitis of right lower limb: Secondary | ICD-10-CM | POA: Diagnosis not present

## 2016-04-23 DIAGNOSIS — N189 Chronic kidney disease, unspecified: Secondary | ICD-10-CM | POA: Diagnosis not present

## 2016-04-27 DIAGNOSIS — L03115 Cellulitis of right lower limb: Secondary | ICD-10-CM | POA: Diagnosis not present

## 2016-04-27 DIAGNOSIS — I129 Hypertensive chronic kidney disease with stage 1 through stage 4 chronic kidney disease, or unspecified chronic kidney disease: Secondary | ICD-10-CM | POA: Diagnosis not present

## 2016-04-27 DIAGNOSIS — E1122 Type 2 diabetes mellitus with diabetic chronic kidney disease: Secondary | ICD-10-CM | POA: Diagnosis not present

## 2016-04-27 DIAGNOSIS — T814XXA Infection following a procedure, initial encounter: Secondary | ICD-10-CM | POA: Diagnosis not present

## 2016-04-27 DIAGNOSIS — N189 Chronic kidney disease, unspecified: Secondary | ICD-10-CM | POA: Diagnosis not present

## 2016-04-27 DIAGNOSIS — E1151 Type 2 diabetes mellitus with diabetic peripheral angiopathy without gangrene: Secondary | ICD-10-CM | POA: Diagnosis not present

## 2016-04-30 DIAGNOSIS — I739 Peripheral vascular disease, unspecified: Secondary | ICD-10-CM | POA: Diagnosis not present

## 2016-04-30 DIAGNOSIS — Z48812 Encounter for surgical aftercare following surgery on the circulatory system: Secondary | ICD-10-CM | POA: Diagnosis not present

## 2016-05-04 DIAGNOSIS — T814XXA Infection following a procedure, initial encounter: Secondary | ICD-10-CM | POA: Diagnosis not present

## 2016-05-04 DIAGNOSIS — N189 Chronic kidney disease, unspecified: Secondary | ICD-10-CM | POA: Diagnosis not present

## 2016-05-04 DIAGNOSIS — I129 Hypertensive chronic kidney disease with stage 1 through stage 4 chronic kidney disease, or unspecified chronic kidney disease: Secondary | ICD-10-CM | POA: Diagnosis not present

## 2016-05-04 DIAGNOSIS — E1122 Type 2 diabetes mellitus with diabetic chronic kidney disease: Secondary | ICD-10-CM | POA: Diagnosis not present

## 2016-05-04 DIAGNOSIS — L03115 Cellulitis of right lower limb: Secondary | ICD-10-CM | POA: Diagnosis not present

## 2016-05-04 DIAGNOSIS — E1151 Type 2 diabetes mellitus with diabetic peripheral angiopathy without gangrene: Secondary | ICD-10-CM | POA: Diagnosis not present

## 2016-05-05 DIAGNOSIS — I739 Peripheral vascular disease, unspecified: Secondary | ICD-10-CM | POA: Diagnosis not present

## 2016-05-05 DIAGNOSIS — I25118 Atherosclerotic heart disease of native coronary artery with other forms of angina pectoris: Secondary | ICD-10-CM | POA: Diagnosis not present

## 2016-05-05 DIAGNOSIS — Z48812 Encounter for surgical aftercare following surgery on the circulatory system: Secondary | ICD-10-CM | POA: Diagnosis not present

## 2016-05-05 DIAGNOSIS — I10 Essential (primary) hypertension: Secondary | ICD-10-CM | POA: Diagnosis not present

## 2016-05-05 DIAGNOSIS — S91301A Unspecified open wound, right foot, initial encounter: Secondary | ICD-10-CM | POA: Diagnosis not present

## 2016-05-12 DIAGNOSIS — E1151 Type 2 diabetes mellitus with diabetic peripheral angiopathy without gangrene: Secondary | ICD-10-CM | POA: Diagnosis not present

## 2016-05-12 DIAGNOSIS — L89892 Pressure ulcer of other site, stage 2: Secondary | ICD-10-CM | POA: Diagnosis not present

## 2016-05-12 DIAGNOSIS — E1140 Type 2 diabetes mellitus with diabetic neuropathy, unspecified: Secondary | ICD-10-CM | POA: Diagnosis not present

## 2016-05-14 DIAGNOSIS — E1122 Type 2 diabetes mellitus with diabetic chronic kidney disease: Secondary | ICD-10-CM | POA: Diagnosis not present

## 2016-05-14 DIAGNOSIS — T814XXA Infection following a procedure, initial encounter: Secondary | ICD-10-CM | POA: Diagnosis not present

## 2016-05-14 DIAGNOSIS — E1151 Type 2 diabetes mellitus with diabetic peripheral angiopathy without gangrene: Secondary | ICD-10-CM | POA: Diagnosis not present

## 2016-05-14 DIAGNOSIS — L03115 Cellulitis of right lower limb: Secondary | ICD-10-CM | POA: Diagnosis not present

## 2016-05-14 DIAGNOSIS — N189 Chronic kidney disease, unspecified: Secondary | ICD-10-CM | POA: Diagnosis not present

## 2016-05-14 DIAGNOSIS — I129 Hypertensive chronic kidney disease with stage 1 through stage 4 chronic kidney disease, or unspecified chronic kidney disease: Secondary | ICD-10-CM | POA: Diagnosis not present

## 2016-05-18 DIAGNOSIS — E1151 Type 2 diabetes mellitus with diabetic peripheral angiopathy without gangrene: Secondary | ICD-10-CM | POA: Diagnosis not present

## 2016-05-18 DIAGNOSIS — L03115 Cellulitis of right lower limb: Secondary | ICD-10-CM | POA: Diagnosis not present

## 2016-05-18 DIAGNOSIS — T814XXA Infection following a procedure, initial encounter: Secondary | ICD-10-CM | POA: Diagnosis not present

## 2016-05-18 DIAGNOSIS — E1122 Type 2 diabetes mellitus with diabetic chronic kidney disease: Secondary | ICD-10-CM | POA: Diagnosis not present

## 2016-05-18 DIAGNOSIS — I129 Hypertensive chronic kidney disease with stage 1 through stage 4 chronic kidney disease, or unspecified chronic kidney disease: Secondary | ICD-10-CM | POA: Diagnosis not present

## 2016-05-18 DIAGNOSIS — N189 Chronic kidney disease, unspecified: Secondary | ICD-10-CM | POA: Diagnosis not present

## 2016-05-25 DIAGNOSIS — E1151 Type 2 diabetes mellitus with diabetic peripheral angiopathy without gangrene: Secondary | ICD-10-CM | POA: Diagnosis not present

## 2016-05-25 DIAGNOSIS — I129 Hypertensive chronic kidney disease with stage 1 through stage 4 chronic kidney disease, or unspecified chronic kidney disease: Secondary | ICD-10-CM | POA: Diagnosis not present

## 2016-05-25 DIAGNOSIS — N189 Chronic kidney disease, unspecified: Secondary | ICD-10-CM | POA: Diagnosis not present

## 2016-05-25 DIAGNOSIS — E1122 Type 2 diabetes mellitus with diabetic chronic kidney disease: Secondary | ICD-10-CM | POA: Diagnosis not present

## 2016-05-25 DIAGNOSIS — T814XXA Infection following a procedure, initial encounter: Secondary | ICD-10-CM | POA: Diagnosis not present

## 2016-05-25 DIAGNOSIS — L03115 Cellulitis of right lower limb: Secondary | ICD-10-CM | POA: Diagnosis not present

## 2016-05-26 DIAGNOSIS — L89893 Pressure ulcer of other site, stage 3: Secondary | ICD-10-CM | POA: Diagnosis not present

## 2016-05-26 DIAGNOSIS — E1151 Type 2 diabetes mellitus with diabetic peripheral angiopathy without gangrene: Secondary | ICD-10-CM | POA: Diagnosis not present

## 2016-05-26 DIAGNOSIS — E1140 Type 2 diabetes mellitus with diabetic neuropathy, unspecified: Secondary | ICD-10-CM | POA: Diagnosis not present

## 2016-06-02 DIAGNOSIS — E1122 Type 2 diabetes mellitus with diabetic chronic kidney disease: Secondary | ICD-10-CM | POA: Diagnosis not present

## 2016-06-02 DIAGNOSIS — L03115 Cellulitis of right lower limb: Secondary | ICD-10-CM | POA: Diagnosis not present

## 2016-06-02 DIAGNOSIS — I129 Hypertensive chronic kidney disease with stage 1 through stage 4 chronic kidney disease, or unspecified chronic kidney disease: Secondary | ICD-10-CM | POA: Diagnosis not present

## 2016-06-02 DIAGNOSIS — E1151 Type 2 diabetes mellitus with diabetic peripheral angiopathy without gangrene: Secondary | ICD-10-CM | POA: Diagnosis not present

## 2016-06-02 DIAGNOSIS — T814XXA Infection following a procedure, initial encounter: Secondary | ICD-10-CM | POA: Diagnosis not present

## 2016-06-02 DIAGNOSIS — N189 Chronic kidney disease, unspecified: Secondary | ICD-10-CM | POA: Diagnosis not present

## 2016-06-07 DIAGNOSIS — L03115 Cellulitis of right lower limb: Secondary | ICD-10-CM | POA: Diagnosis not present

## 2016-06-07 DIAGNOSIS — I129 Hypertensive chronic kidney disease with stage 1 through stage 4 chronic kidney disease, or unspecified chronic kidney disease: Secondary | ICD-10-CM | POA: Diagnosis not present

## 2016-06-07 DIAGNOSIS — I2510 Atherosclerotic heart disease of native coronary artery without angina pectoris: Secondary | ICD-10-CM | POA: Diagnosis not present

## 2016-06-07 DIAGNOSIS — E1122 Type 2 diabetes mellitus with diabetic chronic kidney disease: Secondary | ICD-10-CM | POA: Diagnosis not present

## 2016-06-07 DIAGNOSIS — N189 Chronic kidney disease, unspecified: Secondary | ICD-10-CM | POA: Diagnosis not present

## 2016-06-07 DIAGNOSIS — T814XXA Infection following a procedure, initial encounter: Secondary | ICD-10-CM | POA: Diagnosis not present

## 2016-06-08 DIAGNOSIS — N189 Chronic kidney disease, unspecified: Secondary | ICD-10-CM | POA: Diagnosis not present

## 2016-06-08 DIAGNOSIS — I129 Hypertensive chronic kidney disease with stage 1 through stage 4 chronic kidney disease, or unspecified chronic kidney disease: Secondary | ICD-10-CM | POA: Diagnosis not present

## 2016-06-08 DIAGNOSIS — I2510 Atherosclerotic heart disease of native coronary artery without angina pectoris: Secondary | ICD-10-CM | POA: Diagnosis not present

## 2016-06-08 DIAGNOSIS — E1122 Type 2 diabetes mellitus with diabetic chronic kidney disease: Secondary | ICD-10-CM | POA: Diagnosis not present

## 2016-06-08 DIAGNOSIS — T814XXA Infection following a procedure, initial encounter: Secondary | ICD-10-CM | POA: Diagnosis not present

## 2016-06-08 DIAGNOSIS — L03115 Cellulitis of right lower limb: Secondary | ICD-10-CM | POA: Diagnosis not present

## 2016-06-09 DIAGNOSIS — E039 Hypothyroidism, unspecified: Secondary | ICD-10-CM | POA: Diagnosis not present

## 2016-06-09 DIAGNOSIS — E782 Mixed hyperlipidemia: Secondary | ICD-10-CM | POA: Diagnosis not present

## 2016-06-09 DIAGNOSIS — E1165 Type 2 diabetes mellitus with hyperglycemia: Secondary | ICD-10-CM | POA: Diagnosis not present

## 2016-06-10 DIAGNOSIS — E1151 Type 2 diabetes mellitus with diabetic peripheral angiopathy without gangrene: Secondary | ICD-10-CM | POA: Diagnosis not present

## 2016-06-10 DIAGNOSIS — E1140 Type 2 diabetes mellitus with diabetic neuropathy, unspecified: Secondary | ICD-10-CM | POA: Diagnosis not present

## 2016-06-10 DIAGNOSIS — B351 Tinea unguium: Secondary | ICD-10-CM | POA: Diagnosis not present

## 2016-06-17 DIAGNOSIS — E1122 Type 2 diabetes mellitus with diabetic chronic kidney disease: Secondary | ICD-10-CM | POA: Diagnosis not present

## 2016-06-17 DIAGNOSIS — N189 Chronic kidney disease, unspecified: Secondary | ICD-10-CM | POA: Diagnosis not present

## 2016-06-17 DIAGNOSIS — I129 Hypertensive chronic kidney disease with stage 1 through stage 4 chronic kidney disease, or unspecified chronic kidney disease: Secondary | ICD-10-CM | POA: Diagnosis not present

## 2016-06-17 DIAGNOSIS — T814XXA Infection following a procedure, initial encounter: Secondary | ICD-10-CM | POA: Diagnosis not present

## 2016-06-17 DIAGNOSIS — L03115 Cellulitis of right lower limb: Secondary | ICD-10-CM | POA: Diagnosis not present

## 2016-06-17 DIAGNOSIS — I2510 Atherosclerotic heart disease of native coronary artery without angina pectoris: Secondary | ICD-10-CM | POA: Diagnosis not present

## 2016-06-24 DIAGNOSIS — L89893 Pressure ulcer of other site, stage 3: Secondary | ICD-10-CM | POA: Diagnosis not present

## 2016-06-24 DIAGNOSIS — E1151 Type 2 diabetes mellitus with diabetic peripheral angiopathy without gangrene: Secondary | ICD-10-CM | POA: Diagnosis not present

## 2016-06-24 DIAGNOSIS — E1140 Type 2 diabetes mellitus with diabetic neuropathy, unspecified: Secondary | ICD-10-CM | POA: Diagnosis not present

## 2016-06-25 DIAGNOSIS — T814XXA Infection following a procedure, initial encounter: Secondary | ICD-10-CM | POA: Diagnosis not present

## 2016-06-25 DIAGNOSIS — L03115 Cellulitis of right lower limb: Secondary | ICD-10-CM | POA: Diagnosis not present

## 2016-06-25 DIAGNOSIS — I2510 Atherosclerotic heart disease of native coronary artery without angina pectoris: Secondary | ICD-10-CM | POA: Diagnosis not present

## 2016-06-25 DIAGNOSIS — N189 Chronic kidney disease, unspecified: Secondary | ICD-10-CM | POA: Diagnosis not present

## 2016-06-25 DIAGNOSIS — E1122 Type 2 diabetes mellitus with diabetic chronic kidney disease: Secondary | ICD-10-CM | POA: Diagnosis not present

## 2016-06-25 DIAGNOSIS — I129 Hypertensive chronic kidney disease with stage 1 through stage 4 chronic kidney disease, or unspecified chronic kidney disease: Secondary | ICD-10-CM | POA: Diagnosis not present

## 2016-06-28 DIAGNOSIS — E113293 Type 2 diabetes mellitus with mild nonproliferative diabetic retinopathy without macular edema, bilateral: Secondary | ICD-10-CM | POA: Diagnosis not present

## 2016-06-28 DIAGNOSIS — H47321 Drusen of optic disc, right eye: Secondary | ICD-10-CM | POA: Diagnosis not present

## 2016-06-28 DIAGNOSIS — Z794 Long term (current) use of insulin: Secondary | ICD-10-CM | POA: Diagnosis not present

## 2016-07-01 DIAGNOSIS — I129 Hypertensive chronic kidney disease with stage 1 through stage 4 chronic kidney disease, or unspecified chronic kidney disease: Secondary | ICD-10-CM | POA: Diagnosis not present

## 2016-07-01 DIAGNOSIS — I2510 Atherosclerotic heart disease of native coronary artery without angina pectoris: Secondary | ICD-10-CM | POA: Diagnosis not present

## 2016-07-01 DIAGNOSIS — T814XXA Infection following a procedure, initial encounter: Secondary | ICD-10-CM | POA: Diagnosis not present

## 2016-07-01 DIAGNOSIS — L03115 Cellulitis of right lower limb: Secondary | ICD-10-CM | POA: Diagnosis not present

## 2016-07-01 DIAGNOSIS — E1122 Type 2 diabetes mellitus with diabetic chronic kidney disease: Secondary | ICD-10-CM | POA: Diagnosis not present

## 2016-07-01 DIAGNOSIS — N189 Chronic kidney disease, unspecified: Secondary | ICD-10-CM | POA: Diagnosis not present

## 2016-07-07 DIAGNOSIS — B351 Tinea unguium: Secondary | ICD-10-CM | POA: Diagnosis not present

## 2016-07-07 DIAGNOSIS — E1122 Type 2 diabetes mellitus with diabetic chronic kidney disease: Secondary | ICD-10-CM | POA: Diagnosis not present

## 2016-07-07 DIAGNOSIS — N189 Chronic kidney disease, unspecified: Secondary | ICD-10-CM | POA: Diagnosis not present

## 2016-07-07 DIAGNOSIS — I2510 Atherosclerotic heart disease of native coronary artery without angina pectoris: Secondary | ICD-10-CM | POA: Diagnosis not present

## 2016-07-07 DIAGNOSIS — I129 Hypertensive chronic kidney disease with stage 1 through stage 4 chronic kidney disease, or unspecified chronic kidney disease: Secondary | ICD-10-CM | POA: Diagnosis not present

## 2016-07-07 DIAGNOSIS — L03115 Cellulitis of right lower limb: Secondary | ICD-10-CM | POA: Diagnosis not present

## 2016-07-07 DIAGNOSIS — L89893 Pressure ulcer of other site, stage 3: Secondary | ICD-10-CM | POA: Diagnosis not present

## 2016-07-07 DIAGNOSIS — E1140 Type 2 diabetes mellitus with diabetic neuropathy, unspecified: Secondary | ICD-10-CM | POA: Diagnosis not present

## 2016-07-07 DIAGNOSIS — T814XXA Infection following a procedure, initial encounter: Secondary | ICD-10-CM | POA: Diagnosis not present

## 2016-07-15 DIAGNOSIS — T814XXA Infection following a procedure, initial encounter: Secondary | ICD-10-CM | POA: Diagnosis not present

## 2016-07-15 DIAGNOSIS — I129 Hypertensive chronic kidney disease with stage 1 through stage 4 chronic kidney disease, or unspecified chronic kidney disease: Secondary | ICD-10-CM | POA: Diagnosis not present

## 2016-07-15 DIAGNOSIS — L03115 Cellulitis of right lower limb: Secondary | ICD-10-CM | POA: Diagnosis not present

## 2016-07-15 DIAGNOSIS — E1122 Type 2 diabetes mellitus with diabetic chronic kidney disease: Secondary | ICD-10-CM | POA: Diagnosis not present

## 2016-07-15 DIAGNOSIS — N189 Chronic kidney disease, unspecified: Secondary | ICD-10-CM | POA: Diagnosis not present

## 2016-07-15 DIAGNOSIS — I2510 Atherosclerotic heart disease of native coronary artery without angina pectoris: Secondary | ICD-10-CM | POA: Diagnosis not present

## 2016-07-16 DIAGNOSIS — I10 Essential (primary) hypertension: Secondary | ICD-10-CM | POA: Diagnosis not present

## 2016-07-16 DIAGNOSIS — I340 Nonrheumatic mitral (valve) insufficiency: Secondary | ICD-10-CM | POA: Diagnosis not present

## 2016-07-16 DIAGNOSIS — E11621 Type 2 diabetes mellitus with foot ulcer: Secondary | ICD-10-CM | POA: Diagnosis not present

## 2016-07-16 DIAGNOSIS — E11618 Type 2 diabetes mellitus with other diabetic arthropathy: Secondary | ICD-10-CM | POA: Diagnosis not present

## 2016-07-16 DIAGNOSIS — R079 Chest pain, unspecified: Secondary | ICD-10-CM | POA: Diagnosis not present

## 2016-07-16 DIAGNOSIS — I350 Nonrheumatic aortic (valve) stenosis: Secondary | ICD-10-CM | POA: Diagnosis not present

## 2016-07-16 DIAGNOSIS — E1165 Type 2 diabetes mellitus with hyperglycemia: Secondary | ICD-10-CM | POA: Diagnosis not present

## 2016-07-16 DIAGNOSIS — D519 Vitamin B12 deficiency anemia, unspecified: Secondary | ICD-10-CM | POA: Diagnosis not present

## 2016-07-16 DIAGNOSIS — R011 Cardiac murmur, unspecified: Secondary | ICD-10-CM | POA: Diagnosis not present

## 2016-07-16 DIAGNOSIS — E7800 Pure hypercholesterolemia, unspecified: Secondary | ICD-10-CM | POA: Diagnosis not present

## 2016-07-16 DIAGNOSIS — I708 Atherosclerosis of other arteries: Secondary | ICD-10-CM | POA: Diagnosis not present

## 2016-07-21 DIAGNOSIS — E1165 Type 2 diabetes mellitus with hyperglycemia: Secondary | ICD-10-CM | POA: Diagnosis not present

## 2016-07-21 DIAGNOSIS — E782 Mixed hyperlipidemia: Secondary | ICD-10-CM | POA: Diagnosis not present

## 2016-07-21 DIAGNOSIS — E1140 Type 2 diabetes mellitus with diabetic neuropathy, unspecified: Secondary | ICD-10-CM | POA: Diagnosis not present

## 2016-07-21 DIAGNOSIS — E063 Autoimmune thyroiditis: Secondary | ICD-10-CM | POA: Diagnosis not present

## 2016-07-21 DIAGNOSIS — I10 Essential (primary) hypertension: Secondary | ICD-10-CM | POA: Diagnosis not present

## 2016-07-21 DIAGNOSIS — L89893 Pressure ulcer of other site, stage 3: Secondary | ICD-10-CM | POA: Diagnosis not present

## 2016-07-22 DIAGNOSIS — L03115 Cellulitis of right lower limb: Secondary | ICD-10-CM | POA: Diagnosis not present

## 2016-07-22 DIAGNOSIS — N189 Chronic kidney disease, unspecified: Secondary | ICD-10-CM | POA: Diagnosis not present

## 2016-07-22 DIAGNOSIS — I2510 Atherosclerotic heart disease of native coronary artery without angina pectoris: Secondary | ICD-10-CM | POA: Diagnosis not present

## 2016-07-22 DIAGNOSIS — E1122 Type 2 diabetes mellitus with diabetic chronic kidney disease: Secondary | ICD-10-CM | POA: Diagnosis not present

## 2016-07-22 DIAGNOSIS — T814XXA Infection following a procedure, initial encounter: Secondary | ICD-10-CM | POA: Diagnosis not present

## 2016-07-22 DIAGNOSIS — I129 Hypertensive chronic kidney disease with stage 1 through stage 4 chronic kidney disease, or unspecified chronic kidney disease: Secondary | ICD-10-CM | POA: Diagnosis not present

## 2016-07-28 DIAGNOSIS — E1143 Type 2 diabetes mellitus with diabetic autonomic (poly)neuropathy: Secondary | ICD-10-CM | POA: Diagnosis not present

## 2016-07-28 DIAGNOSIS — N184 Chronic kidney disease, stage 4 (severe): Secondary | ICD-10-CM | POA: Diagnosis not present

## 2016-07-28 DIAGNOSIS — I10 Essential (primary) hypertension: Secondary | ICD-10-CM | POA: Diagnosis not present

## 2016-07-28 DIAGNOSIS — E7800 Pure hypercholesterolemia, unspecified: Secondary | ICD-10-CM | POA: Diagnosis not present

## 2016-07-28 DIAGNOSIS — Z0000 Encounter for general adult medical examination without abnormal findings: Secondary | ICD-10-CM | POA: Diagnosis not present

## 2016-07-28 DIAGNOSIS — I350 Nonrheumatic aortic (valve) stenosis: Secondary | ICD-10-CM | POA: Diagnosis not present

## 2016-07-28 DIAGNOSIS — F330 Major depressive disorder, recurrent, mild: Secondary | ICD-10-CM | POA: Diagnosis not present

## 2016-07-28 DIAGNOSIS — E11621 Type 2 diabetes mellitus with foot ulcer: Secondary | ICD-10-CM | POA: Diagnosis not present

## 2016-07-30 DIAGNOSIS — I129 Hypertensive chronic kidney disease with stage 1 through stage 4 chronic kidney disease, or unspecified chronic kidney disease: Secondary | ICD-10-CM | POA: Diagnosis not present

## 2016-07-30 DIAGNOSIS — E1122 Type 2 diabetes mellitus with diabetic chronic kidney disease: Secondary | ICD-10-CM | POA: Diagnosis not present

## 2016-07-30 DIAGNOSIS — L03115 Cellulitis of right lower limb: Secondary | ICD-10-CM | POA: Diagnosis not present

## 2016-07-30 DIAGNOSIS — N189 Chronic kidney disease, unspecified: Secondary | ICD-10-CM | POA: Diagnosis not present

## 2016-07-30 DIAGNOSIS — T814XXA Infection following a procedure, initial encounter: Secondary | ICD-10-CM | POA: Diagnosis not present

## 2016-07-30 DIAGNOSIS — I2510 Atherosclerotic heart disease of native coronary artery without angina pectoris: Secondary | ICD-10-CM | POA: Diagnosis not present

## 2016-08-04 DIAGNOSIS — E1140 Type 2 diabetes mellitus with diabetic neuropathy, unspecified: Secondary | ICD-10-CM | POA: Diagnosis not present

## 2016-08-04 DIAGNOSIS — L89892 Pressure ulcer of other site, stage 2: Secondary | ICD-10-CM | POA: Diagnosis not present

## 2016-08-04 DIAGNOSIS — E1151 Type 2 diabetes mellitus with diabetic peripheral angiopathy without gangrene: Secondary | ICD-10-CM | POA: Diagnosis not present

## 2016-08-05 DIAGNOSIS — E1122 Type 2 diabetes mellitus with diabetic chronic kidney disease: Secondary | ICD-10-CM | POA: Diagnosis not present

## 2016-08-05 DIAGNOSIS — T814XXA Infection following a procedure, initial encounter: Secondary | ICD-10-CM | POA: Diagnosis not present

## 2016-08-05 DIAGNOSIS — N189 Chronic kidney disease, unspecified: Secondary | ICD-10-CM | POA: Diagnosis not present

## 2016-08-05 DIAGNOSIS — L03115 Cellulitis of right lower limb: Secondary | ICD-10-CM | POA: Diagnosis not present

## 2016-08-05 DIAGNOSIS — I2510 Atherosclerotic heart disease of native coronary artery without angina pectoris: Secondary | ICD-10-CM | POA: Diagnosis not present

## 2016-08-05 DIAGNOSIS — I129 Hypertensive chronic kidney disease with stage 1 through stage 4 chronic kidney disease, or unspecified chronic kidney disease: Secondary | ICD-10-CM | POA: Diagnosis not present

## 2016-08-06 DIAGNOSIS — I129 Hypertensive chronic kidney disease with stage 1 through stage 4 chronic kidney disease, or unspecified chronic kidney disease: Secondary | ICD-10-CM | POA: Diagnosis not present

## 2016-08-06 DIAGNOSIS — N189 Chronic kidney disease, unspecified: Secondary | ICD-10-CM | POA: Diagnosis not present

## 2016-08-06 DIAGNOSIS — L03115 Cellulitis of right lower limb: Secondary | ICD-10-CM | POA: Diagnosis not present

## 2016-08-06 DIAGNOSIS — T814XXA Infection following a procedure, initial encounter: Secondary | ICD-10-CM | POA: Diagnosis not present

## 2016-08-06 DIAGNOSIS — I2510 Atherosclerotic heart disease of native coronary artery without angina pectoris: Secondary | ICD-10-CM | POA: Diagnosis not present

## 2016-08-06 DIAGNOSIS — E1122 Type 2 diabetes mellitus with diabetic chronic kidney disease: Secondary | ICD-10-CM | POA: Diagnosis not present

## 2016-08-12 DIAGNOSIS — B351 Tinea unguium: Secondary | ICD-10-CM | POA: Diagnosis not present

## 2016-08-12 DIAGNOSIS — E1140 Type 2 diabetes mellitus with diabetic neuropathy, unspecified: Secondary | ICD-10-CM | POA: Diagnosis not present

## 2016-08-12 DIAGNOSIS — E1151 Type 2 diabetes mellitus with diabetic peripheral angiopathy without gangrene: Secondary | ICD-10-CM | POA: Diagnosis not present

## 2016-08-13 DIAGNOSIS — L03115 Cellulitis of right lower limb: Secondary | ICD-10-CM | POA: Diagnosis not present

## 2016-08-13 DIAGNOSIS — E1122 Type 2 diabetes mellitus with diabetic chronic kidney disease: Secondary | ICD-10-CM | POA: Diagnosis not present

## 2016-08-13 DIAGNOSIS — T814XXA Infection following a procedure, initial encounter: Secondary | ICD-10-CM | POA: Diagnosis not present

## 2016-08-13 DIAGNOSIS — I129 Hypertensive chronic kidney disease with stage 1 through stage 4 chronic kidney disease, or unspecified chronic kidney disease: Secondary | ICD-10-CM | POA: Diagnosis not present

## 2016-08-13 DIAGNOSIS — I2510 Atherosclerotic heart disease of native coronary artery without angina pectoris: Secondary | ICD-10-CM | POA: Diagnosis not present

## 2016-08-13 DIAGNOSIS — N189 Chronic kidney disease, unspecified: Secondary | ICD-10-CM | POA: Diagnosis not present

## 2016-08-16 DIAGNOSIS — T814XXA Infection following a procedure, initial encounter: Secondary | ICD-10-CM | POA: Diagnosis not present

## 2016-08-16 DIAGNOSIS — L03115 Cellulitis of right lower limb: Secondary | ICD-10-CM | POA: Diagnosis not present

## 2016-08-16 DIAGNOSIS — E1122 Type 2 diabetes mellitus with diabetic chronic kidney disease: Secondary | ICD-10-CM | POA: Diagnosis not present

## 2016-08-16 DIAGNOSIS — N189 Chronic kidney disease, unspecified: Secondary | ICD-10-CM | POA: Diagnosis not present

## 2016-08-16 DIAGNOSIS — I2510 Atherosclerotic heart disease of native coronary artery without angina pectoris: Secondary | ICD-10-CM | POA: Diagnosis not present

## 2016-08-16 DIAGNOSIS — I129 Hypertensive chronic kidney disease with stage 1 through stage 4 chronic kidney disease, or unspecified chronic kidney disease: Secondary | ICD-10-CM | POA: Diagnosis not present

## 2016-08-24 DIAGNOSIS — Z6830 Body mass index (BMI) 30.0-30.9, adult: Secondary | ICD-10-CM | POA: Diagnosis not present

## 2016-08-24 DIAGNOSIS — I10 Essential (primary) hypertension: Secondary | ICD-10-CM | POA: Diagnosis not present

## 2016-08-24 DIAGNOSIS — F330 Major depressive disorder, recurrent, mild: Secondary | ICD-10-CM | POA: Diagnosis not present

## 2016-08-24 DIAGNOSIS — E1143 Type 2 diabetes mellitus with diabetic autonomic (poly)neuropathy: Secondary | ICD-10-CM | POA: Diagnosis not present

## 2016-08-24 DIAGNOSIS — H9209 Otalgia, unspecified ear: Secondary | ICD-10-CM | POA: Diagnosis not present

## 2016-08-24 DIAGNOSIS — I350 Nonrheumatic aortic (valve) stenosis: Secondary | ICD-10-CM | POA: Diagnosis not present

## 2016-08-24 DIAGNOSIS — H6123 Impacted cerumen, bilateral: Secondary | ICD-10-CM | POA: Diagnosis not present

## 2016-09-08 DIAGNOSIS — B351 Tinea unguium: Secondary | ICD-10-CM | POA: Diagnosis not present

## 2016-09-08 DIAGNOSIS — E1151 Type 2 diabetes mellitus with diabetic peripheral angiopathy without gangrene: Secondary | ICD-10-CM | POA: Diagnosis not present

## 2016-09-08 DIAGNOSIS — E1140 Type 2 diabetes mellitus with diabetic neuropathy, unspecified: Secondary | ICD-10-CM | POA: Diagnosis not present

## 2016-09-21 DIAGNOSIS — E782 Mixed hyperlipidemia: Secondary | ICD-10-CM | POA: Diagnosis not present

## 2016-09-21 DIAGNOSIS — E1165 Type 2 diabetes mellitus with hyperglycemia: Secondary | ICD-10-CM | POA: Diagnosis not present

## 2016-09-22 DIAGNOSIS — E1151 Type 2 diabetes mellitus with diabetic peripheral angiopathy without gangrene: Secondary | ICD-10-CM | POA: Diagnosis not present

## 2016-09-22 DIAGNOSIS — E1140 Type 2 diabetes mellitus with diabetic neuropathy, unspecified: Secondary | ICD-10-CM | POA: Diagnosis not present

## 2016-09-22 DIAGNOSIS — L89893 Pressure ulcer of other site, stage 3: Secondary | ICD-10-CM | POA: Diagnosis not present

## 2016-09-30 DIAGNOSIS — Z6828 Body mass index (BMI) 28.0-28.9, adult: Secondary | ICD-10-CM | POA: Diagnosis not present

## 2016-09-30 DIAGNOSIS — Z86010 Personal history of colonic polyps: Secondary | ICD-10-CM | POA: Diagnosis not present

## 2016-09-30 DIAGNOSIS — Z1211 Encounter for screening for malignant neoplasm of colon: Secondary | ICD-10-CM | POA: Diagnosis not present

## 2016-09-30 DIAGNOSIS — Z888 Allergy status to other drugs, medicaments and biological substances status: Secondary | ICD-10-CM | POA: Diagnosis not present

## 2016-09-30 DIAGNOSIS — E1140 Type 2 diabetes mellitus with diabetic neuropathy, unspecified: Secondary | ICD-10-CM | POA: Diagnosis not present

## 2016-09-30 DIAGNOSIS — M1990 Unspecified osteoarthritis, unspecified site: Secondary | ICD-10-CM | POA: Diagnosis not present

## 2016-09-30 DIAGNOSIS — D126 Benign neoplasm of colon, unspecified: Secondary | ICD-10-CM | POA: Diagnosis not present

## 2016-09-30 DIAGNOSIS — Z91030 Bee allergy status: Secondary | ICD-10-CM | POA: Diagnosis not present

## 2016-09-30 DIAGNOSIS — G4730 Sleep apnea, unspecified: Secondary | ICD-10-CM | POA: Diagnosis not present

## 2016-09-30 DIAGNOSIS — E119 Type 2 diabetes mellitus without complications: Secondary | ICD-10-CM | POA: Diagnosis not present

## 2016-09-30 DIAGNOSIS — F329 Major depressive disorder, single episode, unspecified: Secondary | ICD-10-CM | POA: Diagnosis not present

## 2016-09-30 DIAGNOSIS — I10 Essential (primary) hypertension: Secondary | ICD-10-CM | POA: Diagnosis not present

## 2016-09-30 DIAGNOSIS — D122 Benign neoplasm of ascending colon: Secondary | ICD-10-CM | POA: Diagnosis not present

## 2016-09-30 DIAGNOSIS — E669 Obesity, unspecified: Secondary | ICD-10-CM | POA: Diagnosis not present

## 2016-10-06 DIAGNOSIS — L89893 Pressure ulcer of other site, stage 3: Secondary | ICD-10-CM | POA: Diagnosis not present

## 2016-10-06 DIAGNOSIS — E1140 Type 2 diabetes mellitus with diabetic neuropathy, unspecified: Secondary | ICD-10-CM | POA: Diagnosis not present

## 2016-10-06 DIAGNOSIS — E1151 Type 2 diabetes mellitus with diabetic peripheral angiopathy without gangrene: Secondary | ICD-10-CM | POA: Diagnosis not present

## 2016-10-06 DIAGNOSIS — M79671 Pain in right foot: Secondary | ICD-10-CM | POA: Diagnosis not present

## 2016-10-10 DIAGNOSIS — I350 Nonrheumatic aortic (valve) stenosis: Secondary | ICD-10-CM | POA: Diagnosis present

## 2016-10-10 DIAGNOSIS — E11628 Type 2 diabetes mellitus with other skin complications: Secondary | ICD-10-CM | POA: Diagnosis not present

## 2016-10-10 DIAGNOSIS — E039 Hypothyroidism, unspecified: Secondary | ICD-10-CM | POA: Diagnosis present

## 2016-10-10 DIAGNOSIS — E1140 Type 2 diabetes mellitus with diabetic neuropathy, unspecified: Secondary | ICD-10-CM | POA: Diagnosis present

## 2016-10-10 DIAGNOSIS — E1151 Type 2 diabetes mellitus with diabetic peripheral angiopathy without gangrene: Secondary | ICD-10-CM | POA: Diagnosis not present

## 2016-10-10 DIAGNOSIS — R7989 Other specified abnormal findings of blood chemistry: Secondary | ICD-10-CM | POA: Diagnosis not present

## 2016-10-10 DIAGNOSIS — M869 Osteomyelitis, unspecified: Secondary | ICD-10-CM | POA: Diagnosis not present

## 2016-10-10 DIAGNOSIS — L03115 Cellulitis of right lower limb: Secondary | ICD-10-CM | POA: Diagnosis not present

## 2016-10-10 DIAGNOSIS — M868X7 Other osteomyelitis, ankle and foot: Secondary | ICD-10-CM | POA: Diagnosis not present

## 2016-10-10 DIAGNOSIS — Z91030 Bee allergy status: Secondary | ICD-10-CM | POA: Diagnosis not present

## 2016-10-10 DIAGNOSIS — I10 Essential (primary) hypertension: Secondary | ICD-10-CM | POA: Diagnosis not present

## 2016-10-10 DIAGNOSIS — Z951 Presence of aortocoronary bypass graft: Secondary | ICD-10-CM | POA: Diagnosis not present

## 2016-10-10 DIAGNOSIS — I739 Peripheral vascular disease, unspecified: Secondary | ICD-10-CM | POA: Diagnosis not present

## 2016-10-10 DIAGNOSIS — L97519 Non-pressure chronic ulcer of other part of right foot with unspecified severity: Secondary | ICD-10-CM | POA: Diagnosis not present

## 2016-10-10 DIAGNOSIS — D124 Benign neoplasm of descending colon: Secondary | ICD-10-CM | POA: Diagnosis not present

## 2016-10-10 DIAGNOSIS — K922 Gastrointestinal hemorrhage, unspecified: Secondary | ICD-10-CM | POA: Diagnosis not present

## 2016-10-10 DIAGNOSIS — Z888 Allergy status to other drugs, medicaments and biological substances status: Secondary | ICD-10-CM | POA: Diagnosis not present

## 2016-10-10 DIAGNOSIS — F329 Major depressive disorder, single episode, unspecified: Secondary | ICD-10-CM | POA: Diagnosis present

## 2016-10-10 DIAGNOSIS — M79604 Pain in right leg: Secondary | ICD-10-CM | POA: Diagnosis not present

## 2016-10-10 DIAGNOSIS — E785 Hyperlipidemia, unspecified: Secondary | ICD-10-CM | POA: Diagnosis present

## 2016-10-10 DIAGNOSIS — I2510 Atherosclerotic heart disease of native coronary artery without angina pectoris: Secondary | ICD-10-CM | POA: Diagnosis present

## 2016-10-10 DIAGNOSIS — M86679 Other chronic osteomyelitis, unspecified ankle and foot: Secondary | ICD-10-CM | POA: Diagnosis not present

## 2016-10-10 DIAGNOSIS — G4733 Obstructive sleep apnea (adult) (pediatric): Secondary | ICD-10-CM | POA: Diagnosis not present

## 2016-10-10 DIAGNOSIS — R011 Cardiac murmur, unspecified: Secondary | ICD-10-CM | POA: Diagnosis present

## 2016-10-10 DIAGNOSIS — E11621 Type 2 diabetes mellitus with foot ulcer: Secondary | ICD-10-CM | POA: Diagnosis not present

## 2016-10-10 DIAGNOSIS — Z7951 Long term (current) use of inhaled steroids: Secondary | ICD-10-CM | POA: Diagnosis not present

## 2016-10-10 DIAGNOSIS — L089 Local infection of the skin and subcutaneous tissue, unspecified: Secondary | ICD-10-CM | POA: Diagnosis not present

## 2016-10-10 DIAGNOSIS — K921 Melena: Secondary | ICD-10-CM | POA: Diagnosis not present

## 2016-10-10 DIAGNOSIS — D62 Acute posthemorrhagic anemia: Secondary | ICD-10-CM | POA: Diagnosis not present

## 2016-10-10 DIAGNOSIS — K91840 Postprocedural hemorrhage and hematoma of a digestive system organ or structure following a digestive system procedure: Secondary | ICD-10-CM | POA: Diagnosis not present

## 2016-10-10 DIAGNOSIS — N183 Chronic kidney disease, stage 3 (moderate): Secondary | ICD-10-CM | POA: Diagnosis not present

## 2016-10-10 DIAGNOSIS — M7989 Other specified soft tissue disorders: Secondary | ICD-10-CM | POA: Diagnosis not present

## 2016-10-10 DIAGNOSIS — Z7982 Long term (current) use of aspirin: Secondary | ICD-10-CM | POA: Diagnosis not present

## 2016-10-10 DIAGNOSIS — K219 Gastro-esophageal reflux disease without esophagitis: Secondary | ICD-10-CM | POA: Diagnosis not present

## 2016-10-10 DIAGNOSIS — Z135 Encounter for screening for eye and ear disorders: Secondary | ICD-10-CM | POA: Diagnosis not present

## 2016-10-10 DIAGNOSIS — Z79899 Other long term (current) drug therapy: Secondary | ICD-10-CM | POA: Diagnosis not present

## 2016-10-10 DIAGNOSIS — Z794 Long term (current) use of insulin: Secondary | ICD-10-CM | POA: Diagnosis not present

## 2016-10-10 DIAGNOSIS — F419 Anxiety disorder, unspecified: Secondary | ICD-10-CM | POA: Diagnosis present

## 2016-10-10 DIAGNOSIS — E1169 Type 2 diabetes mellitus with other specified complication: Secondary | ICD-10-CM | POA: Diagnosis not present

## 2016-10-10 DIAGNOSIS — D122 Benign neoplasm of ascending colon: Secondary | ICD-10-CM | POA: Diagnosis not present

## 2016-10-10 DIAGNOSIS — E1122 Type 2 diabetes mellitus with diabetic chronic kidney disease: Secondary | ICD-10-CM | POA: Diagnosis not present

## 2016-11-01 ENCOUNTER — Other Ambulatory Visit: Payer: Self-pay

## 2016-11-02 DIAGNOSIS — E1165 Type 2 diabetes mellitus with hyperglycemia: Secondary | ICD-10-CM | POA: Diagnosis not present

## 2016-11-12 DIAGNOSIS — L97511 Non-pressure chronic ulcer of other part of right foot limited to breakdown of skin: Secondary | ICD-10-CM | POA: Diagnosis not present

## 2016-11-26 DIAGNOSIS — L97511 Non-pressure chronic ulcer of other part of right foot limited to breakdown of skin: Secondary | ICD-10-CM | POA: Diagnosis not present

## 2016-12-02 DIAGNOSIS — L89893 Pressure ulcer of other site, stage 3: Secondary | ICD-10-CM | POA: Diagnosis not present

## 2016-12-02 DIAGNOSIS — E1151 Type 2 diabetes mellitus with diabetic peripheral angiopathy without gangrene: Secondary | ICD-10-CM | POA: Diagnosis not present

## 2016-12-02 DIAGNOSIS — E1140 Type 2 diabetes mellitus with diabetic neuropathy, unspecified: Secondary | ICD-10-CM | POA: Diagnosis not present

## 2016-12-16 DIAGNOSIS — E1152 Type 2 diabetes mellitus with diabetic peripheral angiopathy with gangrene: Secondary | ICD-10-CM | POA: Diagnosis present

## 2016-12-16 DIAGNOSIS — E1140 Type 2 diabetes mellitus with diabetic neuropathy, unspecified: Secondary | ICD-10-CM | POA: Diagnosis not present

## 2016-12-16 DIAGNOSIS — R2241 Localized swelling, mass and lump, right lower limb: Secondary | ICD-10-CM | POA: Diagnosis not present

## 2016-12-16 DIAGNOSIS — I25119 Atherosclerotic heart disease of native coronary artery with unspecified angina pectoris: Secondary | ICD-10-CM | POA: Diagnosis present

## 2016-12-16 DIAGNOSIS — E7800 Pure hypercholesterolemia, unspecified: Secondary | ICD-10-CM | POA: Diagnosis not present

## 2016-12-16 DIAGNOSIS — E1136 Type 2 diabetes mellitus with diabetic cataract: Secondary | ICD-10-CM | POA: Diagnosis present

## 2016-12-16 DIAGNOSIS — E1122 Type 2 diabetes mellitus with diabetic chronic kidney disease: Secondary | ICD-10-CM | POA: Diagnosis not present

## 2016-12-16 DIAGNOSIS — L0390 Cellulitis, unspecified: Secondary | ICD-10-CM | POA: Diagnosis not present

## 2016-12-16 DIAGNOSIS — L97413 Non-pressure chronic ulcer of right heel and midfoot with necrosis of muscle: Secondary | ICD-10-CM | POA: Diagnosis not present

## 2016-12-16 DIAGNOSIS — I252 Old myocardial infarction: Secondary | ICD-10-CM | POA: Diagnosis not present

## 2016-12-16 DIAGNOSIS — Z89431 Acquired absence of right foot: Secondary | ICD-10-CM | POA: Diagnosis not present

## 2016-12-16 DIAGNOSIS — Z85820 Personal history of malignant melanoma of skin: Secondary | ICD-10-CM | POA: Diagnosis not present

## 2016-12-16 DIAGNOSIS — R600 Localized edema: Secondary | ICD-10-CM | POA: Diagnosis not present

## 2016-12-16 DIAGNOSIS — F419 Anxiety disorder, unspecified: Secondary | ICD-10-CM | POA: Diagnosis present

## 2016-12-16 DIAGNOSIS — E876 Hypokalemia: Secondary | ICD-10-CM | POA: Diagnosis not present

## 2016-12-16 DIAGNOSIS — E038 Other specified hypothyroidism: Secondary | ICD-10-CM | POA: Diagnosis not present

## 2016-12-16 DIAGNOSIS — M79671 Pain in right foot: Secondary | ICD-10-CM | POA: Diagnosis not present

## 2016-12-16 DIAGNOSIS — L02611 Cutaneous abscess of right foot: Secondary | ICD-10-CM | POA: Diagnosis not present

## 2016-12-16 DIAGNOSIS — E1151 Type 2 diabetes mellitus with diabetic peripheral angiopathy without gangrene: Secondary | ICD-10-CM | POA: Diagnosis not present

## 2016-12-16 DIAGNOSIS — I10 Essential (primary) hypertension: Secondary | ICD-10-CM | POA: Diagnosis not present

## 2016-12-16 DIAGNOSIS — M7989 Other specified soft tissue disorders: Secondary | ICD-10-CM | POA: Diagnosis not present

## 2016-12-16 DIAGNOSIS — S91301A Unspecified open wound, right foot, initial encounter: Secondary | ICD-10-CM | POA: Diagnosis not present

## 2016-12-16 DIAGNOSIS — I350 Nonrheumatic aortic (valve) stenosis: Secondary | ICD-10-CM | POA: Diagnosis present

## 2016-12-16 DIAGNOSIS — L97519 Non-pressure chronic ulcer of other part of right foot with unspecified severity: Secondary | ICD-10-CM | POA: Diagnosis not present

## 2016-12-16 DIAGNOSIS — G4733 Obstructive sleep apnea (adult) (pediatric): Secondary | ICD-10-CM | POA: Diagnosis not present

## 2016-12-16 DIAGNOSIS — E1169 Type 2 diabetes mellitus with other specified complication: Secondary | ICD-10-CM | POA: Diagnosis present

## 2016-12-16 DIAGNOSIS — N183 Chronic kidney disease, stage 3 (moderate): Secondary | ICD-10-CM | POA: Diagnosis not present

## 2016-12-16 DIAGNOSIS — M868X7 Other osteomyelitis, ankle and foot: Secondary | ICD-10-CM | POA: Diagnosis not present

## 2016-12-16 DIAGNOSIS — Z951 Presence of aortocoronary bypass graft: Secondary | ICD-10-CM | POA: Diagnosis not present

## 2016-12-16 DIAGNOSIS — E11628 Type 2 diabetes mellitus with other skin complications: Secondary | ICD-10-CM | POA: Diagnosis not present

## 2016-12-16 DIAGNOSIS — L089 Local infection of the skin and subcutaneous tissue, unspecified: Secondary | ICD-10-CM | POA: Diagnosis not present

## 2016-12-16 DIAGNOSIS — E11621 Type 2 diabetes mellitus with foot ulcer: Secondary | ICD-10-CM | POA: Diagnosis not present

## 2016-12-16 DIAGNOSIS — I96 Gangrene, not elsewhere classified: Secondary | ICD-10-CM | POA: Diagnosis not present

## 2016-12-16 DIAGNOSIS — L97418 Non-pressure chronic ulcer of right heel and midfoot with other specified severity: Secondary | ICD-10-CM | POA: Diagnosis present

## 2016-12-16 DIAGNOSIS — M799 Soft tissue disorder, unspecified: Secondary | ICD-10-CM | POA: Diagnosis not present

## 2016-12-16 DIAGNOSIS — F329 Major depressive disorder, single episode, unspecified: Secondary | ICD-10-CM | POA: Diagnosis present

## 2016-12-16 DIAGNOSIS — I2510 Atherosclerotic heart disease of native coronary artery without angina pectoris: Secondary | ICD-10-CM | POA: Diagnosis not present

## 2016-12-16 DIAGNOSIS — I129 Hypertensive chronic kidney disease with stage 1 through stage 4 chronic kidney disease, or unspecified chronic kidney disease: Secondary | ICD-10-CM | POA: Diagnosis present

## 2016-12-16 DIAGNOSIS — I70203 Unspecified atherosclerosis of native arteries of extremities, bilateral legs: Secondary | ICD-10-CM | POA: Diagnosis not present

## 2016-12-16 DIAGNOSIS — E119 Type 2 diabetes mellitus without complications: Secondary | ICD-10-CM | POA: Diagnosis not present

## 2016-12-16 DIAGNOSIS — I739 Peripheral vascular disease, unspecified: Secondary | ICD-10-CM | POA: Diagnosis not present

## 2016-12-16 DIAGNOSIS — Z794 Long term (current) use of insulin: Secondary | ICD-10-CM | POA: Diagnosis not present

## 2016-12-16 DIAGNOSIS — E1142 Type 2 diabetes mellitus with diabetic polyneuropathy: Secondary | ICD-10-CM | POA: Diagnosis present

## 2016-12-16 DIAGNOSIS — L03115 Cellulitis of right lower limb: Secondary | ICD-10-CM | POA: Diagnosis not present

## 2016-12-22 DIAGNOSIS — E785 Hyperlipidemia, unspecified: Secondary | ICD-10-CM | POA: Diagnosis not present

## 2016-12-22 DIAGNOSIS — E1122 Type 2 diabetes mellitus with diabetic chronic kidney disease: Secondary | ICD-10-CM | POA: Diagnosis not present

## 2016-12-22 DIAGNOSIS — M6281 Muscle weakness (generalized): Secondary | ICD-10-CM | POA: Diagnosis not present

## 2016-12-22 DIAGNOSIS — Z951 Presence of aortocoronary bypass graft: Secondary | ICD-10-CM | POA: Diagnosis not present

## 2016-12-22 DIAGNOSIS — I2510 Atherosclerotic heart disease of native coronary artery without angina pectoris: Secondary | ICD-10-CM | POA: Diagnosis not present

## 2016-12-22 DIAGNOSIS — N183 Chronic kidney disease, stage 3 (moderate): Secondary | ICD-10-CM | POA: Diagnosis not present

## 2016-12-22 DIAGNOSIS — K219 Gastro-esophageal reflux disease without esophagitis: Secondary | ICD-10-CM | POA: Diagnosis not present

## 2016-12-22 DIAGNOSIS — F419 Anxiety disorder, unspecified: Secondary | ICD-10-CM | POA: Diagnosis not present

## 2016-12-22 DIAGNOSIS — I129 Hypertensive chronic kidney disease with stage 1 through stage 4 chronic kidney disease, or unspecified chronic kidney disease: Secondary | ICD-10-CM | POA: Diagnosis not present

## 2016-12-22 DIAGNOSIS — G4733 Obstructive sleep apnea (adult) (pediatric): Secondary | ICD-10-CM | POA: Diagnosis not present

## 2016-12-22 DIAGNOSIS — Z4789 Encounter for other orthopedic aftercare: Secondary | ICD-10-CM | POA: Diagnosis not present

## 2016-12-22 DIAGNOSIS — F329 Major depressive disorder, single episode, unspecified: Secondary | ICD-10-CM | POA: Diagnosis not present

## 2016-12-22 DIAGNOSIS — E039 Hypothyroidism, unspecified: Secondary | ICD-10-CM | POA: Diagnosis not present

## 2016-12-22 DIAGNOSIS — Z794 Long term (current) use of insulin: Secondary | ICD-10-CM | POA: Diagnosis not present

## 2016-12-22 DIAGNOSIS — R262 Difficulty in walking, not elsewhere classified: Secondary | ICD-10-CM | POA: Diagnosis not present

## 2016-12-27 DIAGNOSIS — M6281 Muscle weakness (generalized): Secondary | ICD-10-CM | POA: Diagnosis not present

## 2016-12-27 DIAGNOSIS — R262 Difficulty in walking, not elsewhere classified: Secondary | ICD-10-CM | POA: Diagnosis not present

## 2016-12-27 DIAGNOSIS — I2510 Atherosclerotic heart disease of native coronary artery without angina pectoris: Secondary | ICD-10-CM | POA: Diagnosis not present

## 2016-12-27 DIAGNOSIS — Z4789 Encounter for other orthopedic aftercare: Secondary | ICD-10-CM | POA: Diagnosis not present

## 2016-12-27 DIAGNOSIS — E1122 Type 2 diabetes mellitus with diabetic chronic kidney disease: Secondary | ICD-10-CM | POA: Diagnosis not present

## 2016-12-27 DIAGNOSIS — I129 Hypertensive chronic kidney disease with stage 1 through stage 4 chronic kidney disease, or unspecified chronic kidney disease: Secondary | ICD-10-CM | POA: Diagnosis not present

## 2016-12-29 DIAGNOSIS — H47321 Drusen of optic disc, right eye: Secondary | ICD-10-CM | POA: Diagnosis not present

## 2016-12-29 DIAGNOSIS — Z794 Long term (current) use of insulin: Secondary | ICD-10-CM | POA: Diagnosis not present

## 2016-12-29 DIAGNOSIS — E113393 Type 2 diabetes mellitus with moderate nonproliferative diabetic retinopathy without macular edema, bilateral: Secondary | ICD-10-CM | POA: Diagnosis not present

## 2016-12-30 DIAGNOSIS — E1122 Type 2 diabetes mellitus with diabetic chronic kidney disease: Secondary | ICD-10-CM | POA: Diagnosis not present

## 2016-12-30 DIAGNOSIS — I129 Hypertensive chronic kidney disease with stage 1 through stage 4 chronic kidney disease, or unspecified chronic kidney disease: Secondary | ICD-10-CM | POA: Diagnosis not present

## 2016-12-30 DIAGNOSIS — M6281 Muscle weakness (generalized): Secondary | ICD-10-CM | POA: Diagnosis not present

## 2016-12-30 DIAGNOSIS — I2510 Atherosclerotic heart disease of native coronary artery without angina pectoris: Secondary | ICD-10-CM | POA: Diagnosis not present

## 2016-12-30 DIAGNOSIS — R262 Difficulty in walking, not elsewhere classified: Secondary | ICD-10-CM | POA: Diagnosis not present

## 2016-12-30 DIAGNOSIS — Z4789 Encounter for other orthopedic aftercare: Secondary | ICD-10-CM | POA: Diagnosis not present

## 2017-01-03 DIAGNOSIS — M6281 Muscle weakness (generalized): Secondary | ICD-10-CM | POA: Diagnosis not present

## 2017-01-03 DIAGNOSIS — Z4789 Encounter for other orthopedic aftercare: Secondary | ICD-10-CM | POA: Diagnosis not present

## 2017-01-03 DIAGNOSIS — R262 Difficulty in walking, not elsewhere classified: Secondary | ICD-10-CM | POA: Diagnosis not present

## 2017-01-03 DIAGNOSIS — E1122 Type 2 diabetes mellitus with diabetic chronic kidney disease: Secondary | ICD-10-CM | POA: Diagnosis not present

## 2017-01-03 DIAGNOSIS — I2510 Atherosclerotic heart disease of native coronary artery without angina pectoris: Secondary | ICD-10-CM | POA: Diagnosis not present

## 2017-01-03 DIAGNOSIS — I129 Hypertensive chronic kidney disease with stage 1 through stage 4 chronic kidney disease, or unspecified chronic kidney disease: Secondary | ICD-10-CM | POA: Diagnosis not present

## 2017-01-06 DIAGNOSIS — Z4789 Encounter for other orthopedic aftercare: Secondary | ICD-10-CM | POA: Diagnosis not present

## 2017-01-06 DIAGNOSIS — I2510 Atherosclerotic heart disease of native coronary artery without angina pectoris: Secondary | ICD-10-CM | POA: Diagnosis not present

## 2017-01-06 DIAGNOSIS — M6281 Muscle weakness (generalized): Secondary | ICD-10-CM | POA: Diagnosis not present

## 2017-01-06 DIAGNOSIS — R262 Difficulty in walking, not elsewhere classified: Secondary | ICD-10-CM | POA: Diagnosis not present

## 2017-01-06 DIAGNOSIS — E1122 Type 2 diabetes mellitus with diabetic chronic kidney disease: Secondary | ICD-10-CM | POA: Diagnosis not present

## 2017-01-06 DIAGNOSIS — I129 Hypertensive chronic kidney disease with stage 1 through stage 4 chronic kidney disease, or unspecified chronic kidney disease: Secondary | ICD-10-CM | POA: Diagnosis not present

## 2017-01-10 DIAGNOSIS — I2510 Atherosclerotic heart disease of native coronary artery without angina pectoris: Secondary | ICD-10-CM | POA: Diagnosis not present

## 2017-01-10 DIAGNOSIS — Z4789 Encounter for other orthopedic aftercare: Secondary | ICD-10-CM | POA: Diagnosis not present

## 2017-01-10 DIAGNOSIS — R262 Difficulty in walking, not elsewhere classified: Secondary | ICD-10-CM | POA: Diagnosis not present

## 2017-01-10 DIAGNOSIS — E1122 Type 2 diabetes mellitus with diabetic chronic kidney disease: Secondary | ICD-10-CM | POA: Diagnosis not present

## 2017-01-10 DIAGNOSIS — M6281 Muscle weakness (generalized): Secondary | ICD-10-CM | POA: Diagnosis not present

## 2017-01-10 DIAGNOSIS — I129 Hypertensive chronic kidney disease with stage 1 through stage 4 chronic kidney disease, or unspecified chronic kidney disease: Secondary | ICD-10-CM | POA: Diagnosis not present

## 2017-01-13 DIAGNOSIS — I129 Hypertensive chronic kidney disease with stage 1 through stage 4 chronic kidney disease, or unspecified chronic kidney disease: Secondary | ICD-10-CM | POA: Diagnosis not present

## 2017-01-13 DIAGNOSIS — I2510 Atherosclerotic heart disease of native coronary artery without angina pectoris: Secondary | ICD-10-CM | POA: Diagnosis not present

## 2017-01-13 DIAGNOSIS — M6281 Muscle weakness (generalized): Secondary | ICD-10-CM | POA: Diagnosis not present

## 2017-01-13 DIAGNOSIS — R262 Difficulty in walking, not elsewhere classified: Secondary | ICD-10-CM | POA: Diagnosis not present

## 2017-01-13 DIAGNOSIS — E1122 Type 2 diabetes mellitus with diabetic chronic kidney disease: Secondary | ICD-10-CM | POA: Diagnosis not present

## 2017-01-13 DIAGNOSIS — Z4789 Encounter for other orthopedic aftercare: Secondary | ICD-10-CM | POA: Diagnosis not present

## 2017-01-17 DIAGNOSIS — M6281 Muscle weakness (generalized): Secondary | ICD-10-CM | POA: Diagnosis not present

## 2017-01-17 DIAGNOSIS — R262 Difficulty in walking, not elsewhere classified: Secondary | ICD-10-CM | POA: Diagnosis not present

## 2017-01-17 DIAGNOSIS — Z4789 Encounter for other orthopedic aftercare: Secondary | ICD-10-CM | POA: Diagnosis not present

## 2017-01-17 DIAGNOSIS — I2510 Atherosclerotic heart disease of native coronary artery without angina pectoris: Secondary | ICD-10-CM | POA: Diagnosis not present

## 2017-01-17 DIAGNOSIS — E1122 Type 2 diabetes mellitus with diabetic chronic kidney disease: Secondary | ICD-10-CM | POA: Diagnosis not present

## 2017-01-17 DIAGNOSIS — I129 Hypertensive chronic kidney disease with stage 1 through stage 4 chronic kidney disease, or unspecified chronic kidney disease: Secondary | ICD-10-CM | POA: Diagnosis not present

## 2017-01-21 DIAGNOSIS — M6281 Muscle weakness (generalized): Secondary | ICD-10-CM | POA: Diagnosis not present

## 2017-01-21 DIAGNOSIS — Z4789 Encounter for other orthopedic aftercare: Secondary | ICD-10-CM | POA: Diagnosis not present

## 2017-01-21 DIAGNOSIS — I2510 Atherosclerotic heart disease of native coronary artery without angina pectoris: Secondary | ICD-10-CM | POA: Diagnosis not present

## 2017-01-21 DIAGNOSIS — I129 Hypertensive chronic kidney disease with stage 1 through stage 4 chronic kidney disease, or unspecified chronic kidney disease: Secondary | ICD-10-CM | POA: Diagnosis not present

## 2017-01-21 DIAGNOSIS — R262 Difficulty in walking, not elsewhere classified: Secondary | ICD-10-CM | POA: Diagnosis not present

## 2017-01-21 DIAGNOSIS — E1122 Type 2 diabetes mellitus with diabetic chronic kidney disease: Secondary | ICD-10-CM | POA: Diagnosis not present

## 2017-01-24 DIAGNOSIS — E1143 Type 2 diabetes mellitus with diabetic autonomic (poly)neuropathy: Secondary | ICD-10-CM | POA: Diagnosis not present

## 2017-01-24 DIAGNOSIS — E11618 Type 2 diabetes mellitus with other diabetic arthropathy: Secondary | ICD-10-CM | POA: Diagnosis not present

## 2017-01-24 DIAGNOSIS — I10 Essential (primary) hypertension: Secondary | ICD-10-CM | POA: Diagnosis not present

## 2017-01-24 DIAGNOSIS — N184 Chronic kidney disease, stage 4 (severe): Secondary | ICD-10-CM | POA: Diagnosis not present

## 2017-01-24 DIAGNOSIS — E1165 Type 2 diabetes mellitus with hyperglycemia: Secondary | ICD-10-CM | POA: Diagnosis not present

## 2017-01-25 DIAGNOSIS — Z4789 Encounter for other orthopedic aftercare: Secondary | ICD-10-CM | POA: Diagnosis not present

## 2017-01-25 DIAGNOSIS — E1122 Type 2 diabetes mellitus with diabetic chronic kidney disease: Secondary | ICD-10-CM | POA: Diagnosis not present

## 2017-01-25 DIAGNOSIS — I129 Hypertensive chronic kidney disease with stage 1 through stage 4 chronic kidney disease, or unspecified chronic kidney disease: Secondary | ICD-10-CM | POA: Diagnosis not present

## 2017-01-25 DIAGNOSIS — R262 Difficulty in walking, not elsewhere classified: Secondary | ICD-10-CM | POA: Diagnosis not present

## 2017-01-25 DIAGNOSIS — M6281 Muscle weakness (generalized): Secondary | ICD-10-CM | POA: Diagnosis not present

## 2017-01-25 DIAGNOSIS — I2510 Atherosclerotic heart disease of native coronary artery without angina pectoris: Secondary | ICD-10-CM | POA: Diagnosis not present

## 2017-01-27 DIAGNOSIS — I129 Hypertensive chronic kidney disease with stage 1 through stage 4 chronic kidney disease, or unspecified chronic kidney disease: Secondary | ICD-10-CM | POA: Diagnosis not present

## 2017-01-27 DIAGNOSIS — E11618 Type 2 diabetes mellitus with other diabetic arthropathy: Secondary | ICD-10-CM | POA: Diagnosis not present

## 2017-01-27 DIAGNOSIS — I350 Nonrheumatic aortic (valve) stenosis: Secondary | ICD-10-CM | POA: Diagnosis not present

## 2017-01-27 DIAGNOSIS — I2510 Atherosclerotic heart disease of native coronary artery without angina pectoris: Secondary | ICD-10-CM | POA: Diagnosis not present

## 2017-01-27 DIAGNOSIS — E11621 Type 2 diabetes mellitus with foot ulcer: Secondary | ICD-10-CM | POA: Diagnosis not present

## 2017-01-27 DIAGNOSIS — R262 Difficulty in walking, not elsewhere classified: Secondary | ICD-10-CM | POA: Diagnosis not present

## 2017-01-27 DIAGNOSIS — F330 Major depressive disorder, recurrent, mild: Secondary | ICD-10-CM | POA: Diagnosis not present

## 2017-01-27 DIAGNOSIS — Z4789 Encounter for other orthopedic aftercare: Secondary | ICD-10-CM | POA: Diagnosis not present

## 2017-01-27 DIAGNOSIS — E1143 Type 2 diabetes mellitus with diabetic autonomic (poly)neuropathy: Secondary | ICD-10-CM | POA: Diagnosis not present

## 2017-01-27 DIAGNOSIS — M6281 Muscle weakness (generalized): Secondary | ICD-10-CM | POA: Diagnosis not present

## 2017-01-27 DIAGNOSIS — E1122 Type 2 diabetes mellitus with diabetic chronic kidney disease: Secondary | ICD-10-CM | POA: Diagnosis not present

## 2017-01-27 DIAGNOSIS — Z6830 Body mass index (BMI) 30.0-30.9, adult: Secondary | ICD-10-CM | POA: Diagnosis not present

## 2017-02-01 DIAGNOSIS — R262 Difficulty in walking, not elsewhere classified: Secondary | ICD-10-CM | POA: Diagnosis not present

## 2017-02-01 DIAGNOSIS — I129 Hypertensive chronic kidney disease with stage 1 through stage 4 chronic kidney disease, or unspecified chronic kidney disease: Secondary | ICD-10-CM | POA: Diagnosis not present

## 2017-02-01 DIAGNOSIS — M6281 Muscle weakness (generalized): Secondary | ICD-10-CM | POA: Diagnosis not present

## 2017-02-01 DIAGNOSIS — Z4789 Encounter for other orthopedic aftercare: Secondary | ICD-10-CM | POA: Diagnosis not present

## 2017-02-01 DIAGNOSIS — E1122 Type 2 diabetes mellitus with diabetic chronic kidney disease: Secondary | ICD-10-CM | POA: Diagnosis not present

## 2017-02-01 DIAGNOSIS — I2510 Atherosclerotic heart disease of native coronary artery without angina pectoris: Secondary | ICD-10-CM | POA: Diagnosis not present

## 2017-02-02 DIAGNOSIS — M6281 Muscle weakness (generalized): Secondary | ICD-10-CM | POA: Diagnosis not present

## 2017-02-02 DIAGNOSIS — I2510 Atherosclerotic heart disease of native coronary artery without angina pectoris: Secondary | ICD-10-CM | POA: Diagnosis not present

## 2017-02-02 DIAGNOSIS — E1122 Type 2 diabetes mellitus with diabetic chronic kidney disease: Secondary | ICD-10-CM | POA: Diagnosis not present

## 2017-02-02 DIAGNOSIS — I129 Hypertensive chronic kidney disease with stage 1 through stage 4 chronic kidney disease, or unspecified chronic kidney disease: Secondary | ICD-10-CM | POA: Diagnosis not present

## 2017-02-02 DIAGNOSIS — R262 Difficulty in walking, not elsewhere classified: Secondary | ICD-10-CM | POA: Diagnosis not present

## 2017-02-02 DIAGNOSIS — Z4789 Encounter for other orthopedic aftercare: Secondary | ICD-10-CM | POA: Diagnosis not present

## 2017-02-03 DIAGNOSIS — L03031 Cellulitis of right toe: Secondary | ICD-10-CM | POA: Diagnosis not present

## 2017-02-03 DIAGNOSIS — E1151 Type 2 diabetes mellitus with diabetic peripheral angiopathy without gangrene: Secondary | ICD-10-CM | POA: Diagnosis not present

## 2017-02-03 DIAGNOSIS — E1140 Type 2 diabetes mellitus with diabetic neuropathy, unspecified: Secondary | ICD-10-CM | POA: Diagnosis not present

## 2017-02-06 DIAGNOSIS — I2510 Atherosclerotic heart disease of native coronary artery without angina pectoris: Secondary | ICD-10-CM | POA: Diagnosis not present

## 2017-02-06 DIAGNOSIS — Z888 Allergy status to other drugs, medicaments and biological substances status: Secondary | ICD-10-CM | POA: Diagnosis not present

## 2017-02-06 DIAGNOSIS — Z794 Long term (current) use of insulin: Secondary | ICD-10-CM | POA: Diagnosis not present

## 2017-02-06 DIAGNOSIS — E1140 Type 2 diabetes mellitus with diabetic neuropathy, unspecified: Secondary | ICD-10-CM | POA: Diagnosis not present

## 2017-02-06 DIAGNOSIS — Z79899 Other long term (current) drug therapy: Secondary | ICD-10-CM | POA: Diagnosis not present

## 2017-02-06 DIAGNOSIS — K219 Gastro-esophageal reflux disease without esophagitis: Secondary | ICD-10-CM | POA: Diagnosis not present

## 2017-02-06 DIAGNOSIS — Z91030 Bee allergy status: Secondary | ICD-10-CM | POA: Diagnosis not present

## 2017-02-06 DIAGNOSIS — T8149XD Infection following a procedure, other surgical site, subsequent encounter: Secondary | ICD-10-CM | POA: Diagnosis not present

## 2017-02-06 DIAGNOSIS — Z85820 Personal history of malignant melanoma of skin: Secondary | ICD-10-CM | POA: Diagnosis not present

## 2017-02-06 DIAGNOSIS — L0889 Other specified local infections of the skin and subcutaneous tissue: Secondary | ICD-10-CM | POA: Diagnosis not present

## 2017-02-06 DIAGNOSIS — M7989 Other specified soft tissue disorders: Secondary | ICD-10-CM | POA: Diagnosis not present

## 2017-02-06 DIAGNOSIS — Z89421 Acquired absence of other right toe(s): Secondary | ICD-10-CM | POA: Diagnosis not present

## 2017-02-06 DIAGNOSIS — Z951 Presence of aortocoronary bypass graft: Secondary | ICD-10-CM | POA: Diagnosis not present

## 2017-02-06 DIAGNOSIS — E785 Hyperlipidemia, unspecified: Secondary | ICD-10-CM | POA: Diagnosis not present

## 2017-02-06 DIAGNOSIS — L989 Disorder of the skin and subcutaneous tissue, unspecified: Secondary | ICD-10-CM | POA: Diagnosis not present

## 2017-02-06 DIAGNOSIS — Z7951 Long term (current) use of inhaled steroids: Secondary | ICD-10-CM | POA: Diagnosis not present

## 2017-02-06 DIAGNOSIS — E079 Disorder of thyroid, unspecified: Secondary | ICD-10-CM | POA: Diagnosis not present

## 2017-02-06 DIAGNOSIS — Z89431 Acquired absence of right foot: Secondary | ICD-10-CM | POA: Diagnosis not present

## 2017-02-06 DIAGNOSIS — R2241 Localized swelling, mass and lump, right lower limb: Secondary | ICD-10-CM | POA: Diagnosis not present

## 2017-02-06 DIAGNOSIS — Z7982 Long term (current) use of aspirin: Secondary | ICD-10-CM | POA: Diagnosis not present

## 2017-02-08 DIAGNOSIS — I2510 Atherosclerotic heart disease of native coronary artery without angina pectoris: Secondary | ICD-10-CM | POA: Diagnosis not present

## 2017-02-08 DIAGNOSIS — Z4789 Encounter for other orthopedic aftercare: Secondary | ICD-10-CM | POA: Diagnosis not present

## 2017-02-08 DIAGNOSIS — R262 Difficulty in walking, not elsewhere classified: Secondary | ICD-10-CM | POA: Diagnosis not present

## 2017-02-08 DIAGNOSIS — I129 Hypertensive chronic kidney disease with stage 1 through stage 4 chronic kidney disease, or unspecified chronic kidney disease: Secondary | ICD-10-CM | POA: Diagnosis not present

## 2017-02-08 DIAGNOSIS — E1122 Type 2 diabetes mellitus with diabetic chronic kidney disease: Secondary | ICD-10-CM | POA: Diagnosis not present

## 2017-02-08 DIAGNOSIS — M6281 Muscle weakness (generalized): Secondary | ICD-10-CM | POA: Diagnosis not present

## 2017-02-10 DIAGNOSIS — I129 Hypertensive chronic kidney disease with stage 1 through stage 4 chronic kidney disease, or unspecified chronic kidney disease: Secondary | ICD-10-CM | POA: Diagnosis not present

## 2017-02-10 DIAGNOSIS — R262 Difficulty in walking, not elsewhere classified: Secondary | ICD-10-CM | POA: Diagnosis not present

## 2017-02-10 DIAGNOSIS — Z4789 Encounter for other orthopedic aftercare: Secondary | ICD-10-CM | POA: Diagnosis not present

## 2017-02-10 DIAGNOSIS — M6281 Muscle weakness (generalized): Secondary | ICD-10-CM | POA: Diagnosis not present

## 2017-02-10 DIAGNOSIS — E1122 Type 2 diabetes mellitus with diabetic chronic kidney disease: Secondary | ICD-10-CM | POA: Diagnosis not present

## 2017-02-10 DIAGNOSIS — I2510 Atherosclerotic heart disease of native coronary artery without angina pectoris: Secondary | ICD-10-CM | POA: Diagnosis not present

## 2017-02-15 DIAGNOSIS — E1122 Type 2 diabetes mellitus with diabetic chronic kidney disease: Secondary | ICD-10-CM | POA: Diagnosis not present

## 2017-02-15 DIAGNOSIS — M6281 Muscle weakness (generalized): Secondary | ICD-10-CM | POA: Diagnosis not present

## 2017-02-15 DIAGNOSIS — R262 Difficulty in walking, not elsewhere classified: Secondary | ICD-10-CM | POA: Diagnosis not present

## 2017-02-15 DIAGNOSIS — Z4789 Encounter for other orthopedic aftercare: Secondary | ICD-10-CM | POA: Diagnosis not present

## 2017-02-15 DIAGNOSIS — I2510 Atherosclerotic heart disease of native coronary artery without angina pectoris: Secondary | ICD-10-CM | POA: Diagnosis not present

## 2017-02-15 DIAGNOSIS — I129 Hypertensive chronic kidney disease with stage 1 through stage 4 chronic kidney disease, or unspecified chronic kidney disease: Secondary | ICD-10-CM | POA: Diagnosis not present

## 2017-02-23 DIAGNOSIS — E1140 Type 2 diabetes mellitus with diabetic neuropathy, unspecified: Secondary | ICD-10-CM | POA: Diagnosis not present

## 2017-02-23 DIAGNOSIS — L03031 Cellulitis of right toe: Secondary | ICD-10-CM | POA: Diagnosis not present

## 2017-02-23 DIAGNOSIS — E1151 Type 2 diabetes mellitus with diabetic peripheral angiopathy without gangrene: Secondary | ICD-10-CM | POA: Diagnosis not present

## 2017-02-23 DIAGNOSIS — M79671 Pain in right foot: Secondary | ICD-10-CM | POA: Diagnosis not present

## 2017-04-14 DIAGNOSIS — E1151 Type 2 diabetes mellitus with diabetic peripheral angiopathy without gangrene: Secondary | ICD-10-CM | POA: Diagnosis not present

## 2017-04-14 DIAGNOSIS — L02611 Cutaneous abscess of right foot: Secondary | ICD-10-CM | POA: Diagnosis not present

## 2017-04-14 DIAGNOSIS — E1140 Type 2 diabetes mellitus with diabetic neuropathy, unspecified: Secondary | ICD-10-CM | POA: Diagnosis not present

## 2017-05-03 DIAGNOSIS — I10 Essential (primary) hypertension: Secondary | ICD-10-CM | POA: Diagnosis not present

## 2017-05-03 DIAGNOSIS — I2510 Atherosclerotic heart disease of native coronary artery without angina pectoris: Secondary | ICD-10-CM | POA: Diagnosis not present

## 2017-05-03 DIAGNOSIS — R0600 Dyspnea, unspecified: Secondary | ICD-10-CM | POA: Diagnosis not present

## 2017-05-06 DIAGNOSIS — E1165 Type 2 diabetes mellitus with hyperglycemia: Secondary | ICD-10-CM | POA: Diagnosis not present

## 2017-05-06 DIAGNOSIS — E782 Mixed hyperlipidemia: Secondary | ICD-10-CM | POA: Diagnosis not present

## 2017-05-16 DIAGNOSIS — Z89431 Acquired absence of right foot: Secondary | ICD-10-CM | POA: Diagnosis not present

## 2017-05-16 DIAGNOSIS — I739 Peripheral vascular disease, unspecified: Secondary | ICD-10-CM | POA: Diagnosis not present

## 2017-05-16 DIAGNOSIS — I2510 Atherosclerotic heart disease of native coronary artery without angina pectoris: Secondary | ICD-10-CM | POA: Diagnosis not present

## 2017-05-16 DIAGNOSIS — E1122 Type 2 diabetes mellitus with diabetic chronic kidney disease: Secondary | ICD-10-CM | POA: Diagnosis not present

## 2017-05-16 DIAGNOSIS — I10 Essential (primary) hypertension: Secondary | ICD-10-CM | POA: Diagnosis not present

## 2017-05-16 DIAGNOSIS — N183 Chronic kidney disease, stage 3 (moderate): Secondary | ICD-10-CM | POA: Diagnosis not present

## 2017-05-16 DIAGNOSIS — Z794 Long term (current) use of insulin: Secondary | ICD-10-CM | POA: Diagnosis not present

## 2017-06-02 DIAGNOSIS — E1165 Type 2 diabetes mellitus with hyperglycemia: Secondary | ICD-10-CM | POA: Diagnosis not present

## 2017-06-14 DIAGNOSIS — I080 Rheumatic disorders of both mitral and aortic valves: Secondary | ICD-10-CM | POA: Diagnosis not present

## 2017-06-14 DIAGNOSIS — I517 Cardiomegaly: Secondary | ICD-10-CM | POA: Diagnosis not present

## 2017-06-14 DIAGNOSIS — I2510 Atherosclerotic heart disease of native coronary artery without angina pectoris: Secondary | ICD-10-CM | POA: Diagnosis not present

## 2017-06-14 DIAGNOSIS — R0600 Dyspnea, unspecified: Secondary | ICD-10-CM | POA: Diagnosis not present

## 2017-06-23 DIAGNOSIS — E1151 Type 2 diabetes mellitus with diabetic peripheral angiopathy without gangrene: Secondary | ICD-10-CM | POA: Diagnosis not present

## 2017-06-23 DIAGNOSIS — M79671 Pain in right foot: Secondary | ICD-10-CM | POA: Diagnosis not present

## 2017-06-23 DIAGNOSIS — M79672 Pain in left foot: Secondary | ICD-10-CM | POA: Diagnosis not present

## 2017-06-23 DIAGNOSIS — E1140 Type 2 diabetes mellitus with diabetic neuropathy, unspecified: Secondary | ICD-10-CM | POA: Diagnosis not present

## 2017-07-04 DIAGNOSIS — I2510 Atherosclerotic heart disease of native coronary artery without angina pectoris: Secondary | ICD-10-CM | POA: Diagnosis not present

## 2017-07-04 DIAGNOSIS — I10 Essential (primary) hypertension: Secondary | ICD-10-CM | POA: Diagnosis not present

## 2017-07-04 DIAGNOSIS — R0602 Shortness of breath: Secondary | ICD-10-CM | POA: Diagnosis not present

## 2017-07-07 DIAGNOSIS — Z91030 Bee allergy status: Secondary | ICD-10-CM | POA: Diagnosis not present

## 2017-07-07 DIAGNOSIS — I129 Hypertensive chronic kidney disease with stage 1 through stage 4 chronic kidney disease, or unspecified chronic kidney disease: Secondary | ICD-10-CM | POA: Diagnosis not present

## 2017-07-07 DIAGNOSIS — E1122 Type 2 diabetes mellitus with diabetic chronic kidney disease: Secondary | ICD-10-CM | POA: Diagnosis not present

## 2017-07-07 DIAGNOSIS — Z888 Allergy status to other drugs, medicaments and biological substances status: Secondary | ICD-10-CM | POA: Diagnosis not present

## 2017-07-07 DIAGNOSIS — K219 Gastro-esophageal reflux disease without esophagitis: Secondary | ICD-10-CM | POA: Diagnosis not present

## 2017-07-07 DIAGNOSIS — I4510 Unspecified right bundle-branch block: Secondary | ICD-10-CM | POA: Diagnosis not present

## 2017-07-07 DIAGNOSIS — I2510 Atherosclerotic heart disease of native coronary artery without angina pectoris: Secondary | ICD-10-CM | POA: Diagnosis not present

## 2017-07-07 DIAGNOSIS — Z951 Presence of aortocoronary bypass graft: Secondary | ICD-10-CM | POA: Diagnosis not present

## 2017-07-07 DIAGNOSIS — F329 Major depressive disorder, single episode, unspecified: Secondary | ICD-10-CM | POA: Diagnosis not present

## 2017-07-07 DIAGNOSIS — Z79899 Other long term (current) drug therapy: Secondary | ICD-10-CM | POA: Diagnosis not present

## 2017-07-07 DIAGNOSIS — G4733 Obstructive sleep apnea (adult) (pediatric): Secondary | ICD-10-CM | POA: Diagnosis not present

## 2017-07-07 DIAGNOSIS — E1151 Type 2 diabetes mellitus with diabetic peripheral angiopathy without gangrene: Secondary | ICD-10-CM | POA: Diagnosis not present

## 2017-07-07 DIAGNOSIS — I25810 Atherosclerosis of coronary artery bypass graft(s) without angina pectoris: Secondary | ICD-10-CM | POA: Diagnosis not present

## 2017-07-07 DIAGNOSIS — I350 Nonrheumatic aortic (valve) stenosis: Secondary | ICD-10-CM | POA: Diagnosis not present

## 2017-07-07 DIAGNOSIS — N183 Chronic kidney disease, stage 3 (moderate): Secondary | ICD-10-CM | POA: Diagnosis not present

## 2017-07-07 DIAGNOSIS — Z7951 Long term (current) use of inhaled steroids: Secondary | ICD-10-CM | POA: Diagnosis not present

## 2017-07-07 DIAGNOSIS — F419 Anxiety disorder, unspecified: Secondary | ICD-10-CM | POA: Diagnosis not present

## 2017-07-07 DIAGNOSIS — Z794 Long term (current) use of insulin: Secondary | ICD-10-CM | POA: Diagnosis not present

## 2017-07-07 DIAGNOSIS — I25799 Atherosclerosis of other coronary artery bypass graft(s) with unspecified angina pectoris: Secondary | ICD-10-CM | POA: Diagnosis not present

## 2017-07-08 DIAGNOSIS — I350 Nonrheumatic aortic (valve) stenosis: Secondary | ICD-10-CM | POA: Diagnosis not present

## 2017-07-08 DIAGNOSIS — I25810 Atherosclerosis of coronary artery bypass graft(s) without angina pectoris: Secondary | ICD-10-CM | POA: Diagnosis not present

## 2017-07-08 DIAGNOSIS — I2510 Atherosclerotic heart disease of native coronary artery without angina pectoris: Secondary | ICD-10-CM | POA: Diagnosis not present

## 2017-07-08 DIAGNOSIS — Z951 Presence of aortocoronary bypass graft: Secondary | ICD-10-CM | POA: Diagnosis not present

## 2017-07-08 DIAGNOSIS — I4510 Unspecified right bundle-branch block: Secondary | ICD-10-CM | POA: Diagnosis not present

## 2017-07-08 DIAGNOSIS — E1122 Type 2 diabetes mellitus with diabetic chronic kidney disease: Secondary | ICD-10-CM | POA: Diagnosis not present

## 2017-07-25 DIAGNOSIS — I10 Essential (primary) hypertension: Secondary | ICD-10-CM | POA: Diagnosis not present

## 2017-07-25 DIAGNOSIS — Z955 Presence of coronary angioplasty implant and graft: Secondary | ICD-10-CM | POA: Diagnosis not present

## 2017-07-25 DIAGNOSIS — R0602 Shortness of breath: Secondary | ICD-10-CM | POA: Diagnosis not present

## 2017-07-25 DIAGNOSIS — I2510 Atherosclerotic heart disease of native coronary artery without angina pectoris: Secondary | ICD-10-CM | POA: Diagnosis not present

## 2017-07-25 DIAGNOSIS — N183 Chronic kidney disease, stage 3 (moderate): Secondary | ICD-10-CM | POA: Diagnosis not present

## 2017-07-25 DIAGNOSIS — E7800 Pure hypercholesterolemia, unspecified: Secondary | ICD-10-CM | POA: Diagnosis not present

## 2017-07-25 DIAGNOSIS — Z951 Presence of aortocoronary bypass graft: Secondary | ICD-10-CM | POA: Diagnosis not present

## 2017-08-01 DIAGNOSIS — D519 Vitamin B12 deficiency anemia, unspecified: Secondary | ICD-10-CM | POA: Diagnosis not present

## 2017-08-01 DIAGNOSIS — E1143 Type 2 diabetes mellitus with diabetic autonomic (poly)neuropathy: Secondary | ICD-10-CM | POA: Diagnosis not present

## 2017-08-01 DIAGNOSIS — E11621 Type 2 diabetes mellitus with foot ulcer: Secondary | ICD-10-CM | POA: Diagnosis not present

## 2017-08-01 DIAGNOSIS — N184 Chronic kidney disease, stage 4 (severe): Secondary | ICD-10-CM | POA: Diagnosis not present

## 2017-08-01 DIAGNOSIS — E7800 Pure hypercholesterolemia, unspecified: Secondary | ICD-10-CM | POA: Diagnosis not present

## 2017-08-01 DIAGNOSIS — E1165 Type 2 diabetes mellitus with hyperglycemia: Secondary | ICD-10-CM | POA: Diagnosis not present

## 2017-08-01 DIAGNOSIS — I10 Essential (primary) hypertension: Secondary | ICD-10-CM | POA: Diagnosis not present

## 2017-08-01 DIAGNOSIS — N19 Unspecified kidney failure: Secondary | ICD-10-CM | POA: Diagnosis not present

## 2017-08-01 DIAGNOSIS — E11618 Type 2 diabetes mellitus with other diabetic arthropathy: Secondary | ICD-10-CM | POA: Diagnosis not present

## 2017-08-01 DIAGNOSIS — I350 Nonrheumatic aortic (valve) stenosis: Secondary | ICD-10-CM | POA: Diagnosis not present

## 2017-08-04 DIAGNOSIS — I10 Essential (primary) hypertension: Secondary | ICD-10-CM | POA: Diagnosis not present

## 2017-08-04 DIAGNOSIS — E782 Mixed hyperlipidemia: Secondary | ICD-10-CM | POA: Diagnosis not present

## 2017-08-04 DIAGNOSIS — R011 Cardiac murmur, unspecified: Secondary | ICD-10-CM | POA: Diagnosis not present

## 2017-08-04 DIAGNOSIS — Q230 Congenital stenosis of aortic valve: Secondary | ICD-10-CM | POA: Diagnosis not present

## 2017-08-04 DIAGNOSIS — Z0001 Encounter for general adult medical examination with abnormal findings: Secondary | ICD-10-CM | POA: Diagnosis not present

## 2017-08-04 DIAGNOSIS — E1143 Type 2 diabetes mellitus with diabetic autonomic (poly)neuropathy: Secondary | ICD-10-CM | POA: Diagnosis not present

## 2017-08-04 DIAGNOSIS — E1165 Type 2 diabetes mellitus with hyperglycemia: Secondary | ICD-10-CM | POA: Diagnosis not present

## 2017-08-04 DIAGNOSIS — Z6830 Body mass index (BMI) 30.0-30.9, adult: Secondary | ICD-10-CM | POA: Diagnosis not present

## 2017-08-04 DIAGNOSIS — E11618 Type 2 diabetes mellitus with other diabetic arthropathy: Secondary | ICD-10-CM | POA: Diagnosis not present

## 2017-08-04 DIAGNOSIS — E11621 Type 2 diabetes mellitus with foot ulcer: Secondary | ICD-10-CM | POA: Diagnosis not present

## 2017-08-05 DIAGNOSIS — E1165 Type 2 diabetes mellitus with hyperglycemia: Secondary | ICD-10-CM | POA: Diagnosis not present

## 2017-09-02 DIAGNOSIS — Z951 Presence of aortocoronary bypass graft: Secondary | ICD-10-CM | POA: Diagnosis not present

## 2017-09-02 DIAGNOSIS — R0602 Shortness of breath: Secondary | ICD-10-CM | POA: Diagnosis not present

## 2017-09-02 DIAGNOSIS — I25118 Atherosclerotic heart disease of native coronary artery with other forms of angina pectoris: Secondary | ICD-10-CM | POA: Diagnosis not present

## 2017-09-02 DIAGNOSIS — I10 Essential (primary) hypertension: Secondary | ICD-10-CM | POA: Diagnosis not present

## 2017-09-07 DIAGNOSIS — E1140 Type 2 diabetes mellitus with diabetic neuropathy, unspecified: Secondary | ICD-10-CM | POA: Diagnosis not present

## 2017-09-07 DIAGNOSIS — E1151 Type 2 diabetes mellitus with diabetic peripheral angiopathy without gangrene: Secondary | ICD-10-CM | POA: Diagnosis not present

## 2017-09-19 DIAGNOSIS — D044 Carcinoma in situ of skin of scalp and neck: Secondary | ICD-10-CM | POA: Diagnosis not present

## 2017-09-19 DIAGNOSIS — L570 Actinic keratosis: Secondary | ICD-10-CM | POA: Diagnosis not present

## 2017-09-19 DIAGNOSIS — D485 Neoplasm of uncertain behavior of skin: Secondary | ICD-10-CM | POA: Diagnosis not present

## 2017-09-19 DIAGNOSIS — D0439 Carcinoma in situ of skin of other parts of face: Secondary | ICD-10-CM | POA: Diagnosis not present

## 2017-09-28 DIAGNOSIS — E113393 Type 2 diabetes mellitus with moderate nonproliferative diabetic retinopathy without macular edema, bilateral: Secondary | ICD-10-CM | POA: Diagnosis not present

## 2017-09-28 DIAGNOSIS — Z794 Long term (current) use of insulin: Secondary | ICD-10-CM | POA: Diagnosis not present

## 2017-09-28 DIAGNOSIS — H47323 Drusen of optic disc, bilateral: Secondary | ICD-10-CM | POA: Diagnosis not present

## 2017-09-29 DIAGNOSIS — C44329 Squamous cell carcinoma of skin of other parts of face: Secondary | ICD-10-CM | POA: Diagnosis not present

## 2017-09-29 DIAGNOSIS — C4442 Squamous cell carcinoma of skin of scalp and neck: Secondary | ICD-10-CM | POA: Diagnosis not present

## 2017-11-04 DIAGNOSIS — E1165 Type 2 diabetes mellitus with hyperglycemia: Secondary | ICD-10-CM | POA: Diagnosis not present

## 2017-11-04 DIAGNOSIS — E039 Hypothyroidism, unspecified: Secondary | ICD-10-CM | POA: Diagnosis not present

## 2017-11-04 DIAGNOSIS — E782 Mixed hyperlipidemia: Secondary | ICD-10-CM | POA: Diagnosis not present

## 2017-11-18 DIAGNOSIS — L110 Acquired keratosis follicularis: Secondary | ICD-10-CM | POA: Diagnosis not present

## 2017-11-18 DIAGNOSIS — E1140 Type 2 diabetes mellitus with diabetic neuropathy, unspecified: Secondary | ICD-10-CM | POA: Diagnosis not present

## 2017-11-18 DIAGNOSIS — E1151 Type 2 diabetes mellitus with diabetic peripheral angiopathy without gangrene: Secondary | ICD-10-CM | POA: Diagnosis not present

## 2017-11-22 DIAGNOSIS — G441 Vascular headache, not elsewhere classified: Secondary | ICD-10-CM | POA: Diagnosis not present

## 2017-11-22 DIAGNOSIS — I2510 Atherosclerotic heart disease of native coronary artery without angina pectoris: Secondary | ICD-10-CM | POA: Diagnosis not present

## 2017-11-22 DIAGNOSIS — R0602 Shortness of breath: Secondary | ICD-10-CM | POA: Diagnosis not present

## 2017-11-22 DIAGNOSIS — M47816 Spondylosis without myelopathy or radiculopathy, lumbar region: Secondary | ICD-10-CM | POA: Diagnosis not present

## 2017-11-22 DIAGNOSIS — Z955 Presence of coronary angioplasty implant and graft: Secondary | ICD-10-CM | POA: Diagnosis not present

## 2017-11-22 DIAGNOSIS — M546 Pain in thoracic spine: Secondary | ICD-10-CM | POA: Diagnosis not present

## 2017-11-22 DIAGNOSIS — M9901 Segmental and somatic dysfunction of cervical region: Secondary | ICD-10-CM | POA: Diagnosis not present

## 2017-11-22 DIAGNOSIS — M47812 Spondylosis without myelopathy or radiculopathy, cervical region: Secondary | ICD-10-CM | POA: Diagnosis not present

## 2017-11-22 DIAGNOSIS — Z951 Presence of aortocoronary bypass graft: Secondary | ICD-10-CM | POA: Diagnosis not present

## 2017-11-22 DIAGNOSIS — I10 Essential (primary) hypertension: Secondary | ICD-10-CM | POA: Diagnosis not present

## 2017-11-22 DIAGNOSIS — M9903 Segmental and somatic dysfunction of lumbar region: Secondary | ICD-10-CM | POA: Diagnosis not present

## 2017-11-22 DIAGNOSIS — M9902 Segmental and somatic dysfunction of thoracic region: Secondary | ICD-10-CM | POA: Diagnosis not present

## 2017-11-25 DIAGNOSIS — M47816 Spondylosis without myelopathy or radiculopathy, lumbar region: Secondary | ICD-10-CM | POA: Diagnosis not present

## 2017-11-25 DIAGNOSIS — G441 Vascular headache, not elsewhere classified: Secondary | ICD-10-CM | POA: Diagnosis not present

## 2017-11-25 DIAGNOSIS — M9901 Segmental and somatic dysfunction of cervical region: Secondary | ICD-10-CM | POA: Diagnosis not present

## 2017-11-25 DIAGNOSIS — M9902 Segmental and somatic dysfunction of thoracic region: Secondary | ICD-10-CM | POA: Diagnosis not present

## 2017-11-25 DIAGNOSIS — M9903 Segmental and somatic dysfunction of lumbar region: Secondary | ICD-10-CM | POA: Diagnosis not present

## 2017-11-25 DIAGNOSIS — M47812 Spondylosis without myelopathy or radiculopathy, cervical region: Secondary | ICD-10-CM | POA: Diagnosis not present

## 2017-11-25 DIAGNOSIS — M546 Pain in thoracic spine: Secondary | ICD-10-CM | POA: Diagnosis not present

## 2017-11-29 DIAGNOSIS — M9901 Segmental and somatic dysfunction of cervical region: Secondary | ICD-10-CM | POA: Diagnosis not present

## 2017-11-29 DIAGNOSIS — M47812 Spondylosis without myelopathy or radiculopathy, cervical region: Secondary | ICD-10-CM | POA: Diagnosis not present

## 2017-11-29 DIAGNOSIS — M9903 Segmental and somatic dysfunction of lumbar region: Secondary | ICD-10-CM | POA: Diagnosis not present

## 2017-11-29 DIAGNOSIS — M9902 Segmental and somatic dysfunction of thoracic region: Secondary | ICD-10-CM | POA: Diagnosis not present

## 2017-11-29 DIAGNOSIS — M546 Pain in thoracic spine: Secondary | ICD-10-CM | POA: Diagnosis not present

## 2017-11-29 DIAGNOSIS — G441 Vascular headache, not elsewhere classified: Secondary | ICD-10-CM | POA: Diagnosis not present

## 2017-11-29 DIAGNOSIS — M47816 Spondylosis without myelopathy or radiculopathy, lumbar region: Secondary | ICD-10-CM | POA: Diagnosis not present

## 2017-12-02 DIAGNOSIS — M47812 Spondylosis without myelopathy or radiculopathy, cervical region: Secondary | ICD-10-CM | POA: Diagnosis not present

## 2017-12-02 DIAGNOSIS — G441 Vascular headache, not elsewhere classified: Secondary | ICD-10-CM | POA: Diagnosis not present

## 2017-12-02 DIAGNOSIS — M9901 Segmental and somatic dysfunction of cervical region: Secondary | ICD-10-CM | POA: Diagnosis not present

## 2017-12-02 DIAGNOSIS — M9903 Segmental and somatic dysfunction of lumbar region: Secondary | ICD-10-CM | POA: Diagnosis not present

## 2017-12-02 DIAGNOSIS — M47816 Spondylosis without myelopathy or radiculopathy, lumbar region: Secondary | ICD-10-CM | POA: Diagnosis not present

## 2017-12-02 DIAGNOSIS — M546 Pain in thoracic spine: Secondary | ICD-10-CM | POA: Diagnosis not present

## 2017-12-02 DIAGNOSIS — M9902 Segmental and somatic dysfunction of thoracic region: Secondary | ICD-10-CM | POA: Diagnosis not present

## 2017-12-06 DIAGNOSIS — G441 Vascular headache, not elsewhere classified: Secondary | ICD-10-CM | POA: Diagnosis not present

## 2017-12-06 DIAGNOSIS — M546 Pain in thoracic spine: Secondary | ICD-10-CM | POA: Diagnosis not present

## 2017-12-06 DIAGNOSIS — M47812 Spondylosis without myelopathy or radiculopathy, cervical region: Secondary | ICD-10-CM | POA: Diagnosis not present

## 2017-12-06 DIAGNOSIS — M9903 Segmental and somatic dysfunction of lumbar region: Secondary | ICD-10-CM | POA: Diagnosis not present

## 2017-12-06 DIAGNOSIS — M9901 Segmental and somatic dysfunction of cervical region: Secondary | ICD-10-CM | POA: Diagnosis not present

## 2017-12-06 DIAGNOSIS — M47816 Spondylosis without myelopathy or radiculopathy, lumbar region: Secondary | ICD-10-CM | POA: Diagnosis not present

## 2017-12-06 DIAGNOSIS — M9902 Segmental and somatic dysfunction of thoracic region: Secondary | ICD-10-CM | POA: Diagnosis not present

## 2017-12-09 DIAGNOSIS — G441 Vascular headache, not elsewhere classified: Secondary | ICD-10-CM | POA: Diagnosis not present

## 2017-12-09 DIAGNOSIS — M9902 Segmental and somatic dysfunction of thoracic region: Secondary | ICD-10-CM | POA: Diagnosis not present

## 2017-12-09 DIAGNOSIS — M47812 Spondylosis without myelopathy or radiculopathy, cervical region: Secondary | ICD-10-CM | POA: Diagnosis not present

## 2017-12-09 DIAGNOSIS — M47816 Spondylosis without myelopathy or radiculopathy, lumbar region: Secondary | ICD-10-CM | POA: Diagnosis not present

## 2017-12-09 DIAGNOSIS — M546 Pain in thoracic spine: Secondary | ICD-10-CM | POA: Diagnosis not present

## 2017-12-09 DIAGNOSIS — M9901 Segmental and somatic dysfunction of cervical region: Secondary | ICD-10-CM | POA: Diagnosis not present

## 2017-12-09 DIAGNOSIS — M9903 Segmental and somatic dysfunction of lumbar region: Secondary | ICD-10-CM | POA: Diagnosis not present

## 2017-12-14 DIAGNOSIS — M9901 Segmental and somatic dysfunction of cervical region: Secondary | ICD-10-CM | POA: Diagnosis not present

## 2017-12-14 DIAGNOSIS — M9903 Segmental and somatic dysfunction of lumbar region: Secondary | ICD-10-CM | POA: Diagnosis not present

## 2017-12-14 DIAGNOSIS — G441 Vascular headache, not elsewhere classified: Secondary | ICD-10-CM | POA: Diagnosis not present

## 2017-12-14 DIAGNOSIS — M546 Pain in thoracic spine: Secondary | ICD-10-CM | POA: Diagnosis not present

## 2017-12-14 DIAGNOSIS — M47812 Spondylosis without myelopathy or radiculopathy, cervical region: Secondary | ICD-10-CM | POA: Diagnosis not present

## 2017-12-14 DIAGNOSIS — M9902 Segmental and somatic dysfunction of thoracic region: Secondary | ICD-10-CM | POA: Diagnosis not present

## 2017-12-14 DIAGNOSIS — M47816 Spondylosis without myelopathy or radiculopathy, lumbar region: Secondary | ICD-10-CM | POA: Diagnosis not present

## 2017-12-21 DIAGNOSIS — G441 Vascular headache, not elsewhere classified: Secondary | ICD-10-CM | POA: Diagnosis not present

## 2017-12-21 DIAGNOSIS — M546 Pain in thoracic spine: Secondary | ICD-10-CM | POA: Diagnosis not present

## 2017-12-21 DIAGNOSIS — M9903 Segmental and somatic dysfunction of lumbar region: Secondary | ICD-10-CM | POA: Diagnosis not present

## 2017-12-21 DIAGNOSIS — M47816 Spondylosis without myelopathy or radiculopathy, lumbar region: Secondary | ICD-10-CM | POA: Diagnosis not present

## 2017-12-21 DIAGNOSIS — M9901 Segmental and somatic dysfunction of cervical region: Secondary | ICD-10-CM | POA: Diagnosis not present

## 2017-12-21 DIAGNOSIS — M9902 Segmental and somatic dysfunction of thoracic region: Secondary | ICD-10-CM | POA: Diagnosis not present

## 2017-12-21 DIAGNOSIS — M47812 Spondylosis without myelopathy or radiculopathy, cervical region: Secondary | ICD-10-CM | POA: Diagnosis not present

## 2017-12-27 DIAGNOSIS — G441 Vascular headache, not elsewhere classified: Secondary | ICD-10-CM | POA: Diagnosis not present

## 2017-12-27 DIAGNOSIS — M9903 Segmental and somatic dysfunction of lumbar region: Secondary | ICD-10-CM | POA: Diagnosis not present

## 2017-12-27 DIAGNOSIS — M47816 Spondylosis without myelopathy or radiculopathy, lumbar region: Secondary | ICD-10-CM | POA: Diagnosis not present

## 2017-12-27 DIAGNOSIS — M546 Pain in thoracic spine: Secondary | ICD-10-CM | POA: Diagnosis not present

## 2017-12-27 DIAGNOSIS — M9901 Segmental and somatic dysfunction of cervical region: Secondary | ICD-10-CM | POA: Diagnosis not present

## 2017-12-27 DIAGNOSIS — M47812 Spondylosis without myelopathy or radiculopathy, cervical region: Secondary | ICD-10-CM | POA: Diagnosis not present

## 2017-12-27 DIAGNOSIS — M9902 Segmental and somatic dysfunction of thoracic region: Secondary | ICD-10-CM | POA: Diagnosis not present

## 2018-01-06 DIAGNOSIS — I350 Nonrheumatic aortic (valve) stenosis: Secondary | ICD-10-CM | POA: Diagnosis not present

## 2018-01-06 DIAGNOSIS — I25118 Atherosclerotic heart disease of native coronary artery with other forms of angina pectoris: Secondary | ICD-10-CM | POA: Diagnosis not present

## 2018-01-06 DIAGNOSIS — I951 Orthostatic hypotension: Secondary | ICD-10-CM | POA: Diagnosis not present

## 2018-01-06 DIAGNOSIS — I10 Essential (primary) hypertension: Secondary | ICD-10-CM | POA: Diagnosis not present

## 2018-01-10 DIAGNOSIS — M47812 Spondylosis without myelopathy or radiculopathy, cervical region: Secondary | ICD-10-CM | POA: Diagnosis not present

## 2018-01-10 DIAGNOSIS — G441 Vascular headache, not elsewhere classified: Secondary | ICD-10-CM | POA: Diagnosis not present

## 2018-01-10 DIAGNOSIS — M9901 Segmental and somatic dysfunction of cervical region: Secondary | ICD-10-CM | POA: Diagnosis not present

## 2018-01-10 DIAGNOSIS — M47816 Spondylosis without myelopathy or radiculopathy, lumbar region: Secondary | ICD-10-CM | POA: Diagnosis not present

## 2018-01-10 DIAGNOSIS — M546 Pain in thoracic spine: Secondary | ICD-10-CM | POA: Diagnosis not present

## 2018-01-10 DIAGNOSIS — M9903 Segmental and somatic dysfunction of lumbar region: Secondary | ICD-10-CM | POA: Diagnosis not present

## 2018-01-10 DIAGNOSIS — M9902 Segmental and somatic dysfunction of thoracic region: Secondary | ICD-10-CM | POA: Diagnosis not present

## 2018-01-13 DIAGNOSIS — H11822 Conjunctivochalasis, left eye: Secondary | ICD-10-CM | POA: Diagnosis not present

## 2018-01-17 DIAGNOSIS — M47812 Spondylosis without myelopathy or radiculopathy, cervical region: Secondary | ICD-10-CM | POA: Diagnosis not present

## 2018-01-17 DIAGNOSIS — G441 Vascular headache, not elsewhere classified: Secondary | ICD-10-CM | POA: Diagnosis not present

## 2018-01-17 DIAGNOSIS — M546 Pain in thoracic spine: Secondary | ICD-10-CM | POA: Diagnosis not present

## 2018-01-17 DIAGNOSIS — M47816 Spondylosis without myelopathy or radiculopathy, lumbar region: Secondary | ICD-10-CM | POA: Diagnosis not present

## 2018-01-17 DIAGNOSIS — M9902 Segmental and somatic dysfunction of thoracic region: Secondary | ICD-10-CM | POA: Diagnosis not present

## 2018-01-17 DIAGNOSIS — M9903 Segmental and somatic dysfunction of lumbar region: Secondary | ICD-10-CM | POA: Diagnosis not present

## 2018-01-17 DIAGNOSIS — M9901 Segmental and somatic dysfunction of cervical region: Secondary | ICD-10-CM | POA: Diagnosis not present

## 2018-01-26 DIAGNOSIS — E1140 Type 2 diabetes mellitus with diabetic neuropathy, unspecified: Secondary | ICD-10-CM | POA: Diagnosis not present

## 2018-01-26 DIAGNOSIS — E1151 Type 2 diabetes mellitus with diabetic peripheral angiopathy without gangrene: Secondary | ICD-10-CM | POA: Diagnosis not present

## 2018-01-26 DIAGNOSIS — M79671 Pain in right foot: Secondary | ICD-10-CM | POA: Diagnosis not present

## 2018-01-26 DIAGNOSIS — L110 Acquired keratosis follicularis: Secondary | ICD-10-CM | POA: Diagnosis not present

## 2018-01-31 DIAGNOSIS — M9903 Segmental and somatic dysfunction of lumbar region: Secondary | ICD-10-CM | POA: Diagnosis not present

## 2018-01-31 DIAGNOSIS — M47816 Spondylosis without myelopathy or radiculopathy, lumbar region: Secondary | ICD-10-CM | POA: Diagnosis not present

## 2018-01-31 DIAGNOSIS — G441 Vascular headache, not elsewhere classified: Secondary | ICD-10-CM | POA: Diagnosis not present

## 2018-01-31 DIAGNOSIS — M47812 Spondylosis without myelopathy or radiculopathy, cervical region: Secondary | ICD-10-CM | POA: Diagnosis not present

## 2018-01-31 DIAGNOSIS — M9902 Segmental and somatic dysfunction of thoracic region: Secondary | ICD-10-CM | POA: Diagnosis not present

## 2018-01-31 DIAGNOSIS — M546 Pain in thoracic spine: Secondary | ICD-10-CM | POA: Diagnosis not present

## 2018-01-31 DIAGNOSIS — M9901 Segmental and somatic dysfunction of cervical region: Secondary | ICD-10-CM | POA: Diagnosis not present

## 2018-02-06 DIAGNOSIS — E782 Mixed hyperlipidemia: Secondary | ICD-10-CM | POA: Diagnosis not present

## 2018-02-09 DIAGNOSIS — E11618 Type 2 diabetes mellitus with other diabetic arthropathy: Secondary | ICD-10-CM | POA: Diagnosis not present

## 2018-02-09 DIAGNOSIS — E11621 Type 2 diabetes mellitus with foot ulcer: Secondary | ICD-10-CM | POA: Diagnosis not present

## 2018-02-09 DIAGNOSIS — R011 Cardiac murmur, unspecified: Secondary | ICD-10-CM | POA: Diagnosis not present

## 2018-02-09 DIAGNOSIS — Q230 Congenital stenosis of aortic valve: Secondary | ICD-10-CM | POA: Diagnosis not present

## 2018-02-09 DIAGNOSIS — I10 Essential (primary) hypertension: Secondary | ICD-10-CM | POA: Diagnosis not present

## 2018-02-09 DIAGNOSIS — E1143 Type 2 diabetes mellitus with diabetic autonomic (poly)neuropathy: Secondary | ICD-10-CM | POA: Diagnosis not present

## 2018-02-09 DIAGNOSIS — E1165 Type 2 diabetes mellitus with hyperglycemia: Secondary | ICD-10-CM | POA: Diagnosis not present

## 2018-02-09 DIAGNOSIS — Z6830 Body mass index (BMI) 30.0-30.9, adult: Secondary | ICD-10-CM | POA: Diagnosis not present

## 2018-02-10 DIAGNOSIS — E1140 Type 2 diabetes mellitus with diabetic neuropathy, unspecified: Secondary | ICD-10-CM | POA: Diagnosis not present

## 2018-02-10 DIAGNOSIS — E1165 Type 2 diabetes mellitus with hyperglycemia: Secondary | ICD-10-CM | POA: Diagnosis not present

## 2018-02-15 DIAGNOSIS — Z23 Encounter for immunization: Secondary | ICD-10-CM | POA: Diagnosis not present

## 2018-03-01 DIAGNOSIS — E1165 Type 2 diabetes mellitus with hyperglycemia: Secondary | ICD-10-CM | POA: Diagnosis not present

## 2018-03-01 DIAGNOSIS — E1140 Type 2 diabetes mellitus with diabetic neuropathy, unspecified: Secondary | ICD-10-CM | POA: Diagnosis not present

## 2018-03-14 DIAGNOSIS — E1165 Type 2 diabetes mellitus with hyperglycemia: Secondary | ICD-10-CM | POA: Diagnosis not present

## 2018-03-14 DIAGNOSIS — E1140 Type 2 diabetes mellitus with diabetic neuropathy, unspecified: Secondary | ICD-10-CM | POA: Diagnosis not present

## 2018-03-21 DIAGNOSIS — L570 Actinic keratosis: Secondary | ICD-10-CM | POA: Diagnosis not present

## 2018-04-19 DIAGNOSIS — E1151 Type 2 diabetes mellitus with diabetic peripheral angiopathy without gangrene: Secondary | ICD-10-CM | POA: Diagnosis not present

## 2018-04-19 DIAGNOSIS — E1140 Type 2 diabetes mellitus with diabetic neuropathy, unspecified: Secondary | ICD-10-CM | POA: Diagnosis not present

## 2018-04-19 DIAGNOSIS — M79672 Pain in left foot: Secondary | ICD-10-CM | POA: Diagnosis not present

## 2018-04-19 DIAGNOSIS — M79671 Pain in right foot: Secondary | ICD-10-CM | POA: Diagnosis not present

## 2018-05-17 DIAGNOSIS — E039 Hypothyroidism, unspecified: Secondary | ICD-10-CM | POA: Diagnosis not present

## 2018-05-17 DIAGNOSIS — E1165 Type 2 diabetes mellitus with hyperglycemia: Secondary | ICD-10-CM | POA: Diagnosis not present

## 2018-05-17 DIAGNOSIS — I10 Essential (primary) hypertension: Secondary | ICD-10-CM | POA: Diagnosis not present

## 2018-05-17 DIAGNOSIS — E11621 Type 2 diabetes mellitus with foot ulcer: Secondary | ICD-10-CM | POA: Diagnosis not present

## 2018-05-17 DIAGNOSIS — E1140 Type 2 diabetes mellitus with diabetic neuropathy, unspecified: Secondary | ICD-10-CM | POA: Diagnosis not present

## 2018-06-09 DIAGNOSIS — I10 Essential (primary) hypertension: Secondary | ICD-10-CM | POA: Diagnosis not present

## 2018-06-09 DIAGNOSIS — E1165 Type 2 diabetes mellitus with hyperglycemia: Secondary | ICD-10-CM | POA: Diagnosis not present

## 2018-06-28 DIAGNOSIS — Z794 Long term (current) use of insulin: Secondary | ICD-10-CM | POA: Diagnosis not present

## 2018-06-28 DIAGNOSIS — H47323 Drusen of optic disc, bilateral: Secondary | ICD-10-CM | POA: Diagnosis not present

## 2018-06-28 DIAGNOSIS — E113393 Type 2 diabetes mellitus with moderate nonproliferative diabetic retinopathy without macular edema, bilateral: Secondary | ICD-10-CM | POA: Diagnosis not present

## 2018-07-05 DIAGNOSIS — I350 Nonrheumatic aortic (valve) stenosis: Secondary | ICD-10-CM | POA: Diagnosis not present

## 2018-07-05 DIAGNOSIS — R0609 Other forms of dyspnea: Secondary | ICD-10-CM | POA: Diagnosis not present

## 2018-07-05 DIAGNOSIS — I25118 Atherosclerotic heart disease of native coronary artery with other forms of angina pectoris: Secondary | ICD-10-CM | POA: Diagnosis not present

## 2018-07-07 DIAGNOSIS — I10 Essential (primary) hypertension: Secondary | ICD-10-CM | POA: Diagnosis not present

## 2018-07-07 DIAGNOSIS — E1165 Type 2 diabetes mellitus with hyperglycemia: Secondary | ICD-10-CM | POA: Diagnosis not present

## 2018-07-07 DIAGNOSIS — E039 Hypothyroidism, unspecified: Secondary | ICD-10-CM | POA: Diagnosis not present

## 2018-07-10 DIAGNOSIS — F331 Major depressive disorder, recurrent, moderate: Secondary | ICD-10-CM | POA: Diagnosis not present

## 2018-07-10 DIAGNOSIS — I10 Essential (primary) hypertension: Secondary | ICD-10-CM | POA: Diagnosis not present

## 2018-07-10 DIAGNOSIS — E039 Hypothyroidism, unspecified: Secondary | ICD-10-CM | POA: Diagnosis not present

## 2018-07-10 DIAGNOSIS — E7800 Pure hypercholesterolemia, unspecified: Secondary | ICD-10-CM | POA: Diagnosis not present

## 2018-07-12 DIAGNOSIS — E1140 Type 2 diabetes mellitus with diabetic neuropathy, unspecified: Secondary | ICD-10-CM | POA: Diagnosis not present

## 2018-07-12 DIAGNOSIS — I739 Peripheral vascular disease, unspecified: Secondary | ICD-10-CM | POA: Diagnosis not present

## 2018-07-12 DIAGNOSIS — M79672 Pain in left foot: Secondary | ICD-10-CM | POA: Diagnosis not present

## 2018-07-12 DIAGNOSIS — M79671 Pain in right foot: Secondary | ICD-10-CM | POA: Diagnosis not present

## 2018-07-12 DIAGNOSIS — L110 Acquired keratosis follicularis: Secondary | ICD-10-CM | POA: Diagnosis not present

## 2018-07-19 DIAGNOSIS — I350 Nonrheumatic aortic (valve) stenosis: Secondary | ICD-10-CM | POA: Diagnosis not present

## 2018-07-19 DIAGNOSIS — I25118 Atherosclerotic heart disease of native coronary artery with other forms of angina pectoris: Secondary | ICD-10-CM | POA: Diagnosis not present

## 2018-08-07 DIAGNOSIS — I10 Essential (primary) hypertension: Secondary | ICD-10-CM | POA: Diagnosis not present

## 2018-08-07 DIAGNOSIS — Z6830 Body mass index (BMI) 30.0-30.9, adult: Secondary | ICD-10-CM | POA: Diagnosis not present

## 2018-08-07 DIAGNOSIS — E1122 Type 2 diabetes mellitus with diabetic chronic kidney disease: Secondary | ICD-10-CM | POA: Diagnosis not present

## 2018-08-07 DIAGNOSIS — E1143 Type 2 diabetes mellitus with diabetic autonomic (poly)neuropathy: Secondary | ICD-10-CM | POA: Diagnosis not present

## 2018-08-07 DIAGNOSIS — E11621 Type 2 diabetes mellitus with foot ulcer: Secondary | ICD-10-CM | POA: Diagnosis not present

## 2018-08-07 DIAGNOSIS — I350 Nonrheumatic aortic (valve) stenosis: Secondary | ICD-10-CM | POA: Diagnosis not present

## 2018-08-07 DIAGNOSIS — E782 Mixed hyperlipidemia: Secondary | ICD-10-CM | POA: Diagnosis not present

## 2018-08-07 DIAGNOSIS — E1165 Type 2 diabetes mellitus with hyperglycemia: Secondary | ICD-10-CM | POA: Diagnosis not present

## 2018-08-10 DIAGNOSIS — I10 Essential (primary) hypertension: Secondary | ICD-10-CM | POA: Diagnosis not present

## 2018-08-10 DIAGNOSIS — E039 Hypothyroidism, unspecified: Secondary | ICD-10-CM | POA: Diagnosis not present

## 2018-08-14 DIAGNOSIS — G4733 Obstructive sleep apnea (adult) (pediatric): Secondary | ICD-10-CM | POA: Diagnosis not present

## 2018-08-14 DIAGNOSIS — I10 Essential (primary) hypertension: Secondary | ICD-10-CM | POA: Diagnosis not present

## 2018-08-14 DIAGNOSIS — I25118 Atherosclerotic heart disease of native coronary artery with other forms of angina pectoris: Secondary | ICD-10-CM | POA: Diagnosis not present

## 2018-08-16 DIAGNOSIS — I2510 Atherosclerotic heart disease of native coronary artery without angina pectoris: Secondary | ICD-10-CM | POA: Diagnosis not present

## 2018-08-16 DIAGNOSIS — Z951 Presence of aortocoronary bypass graft: Secondary | ICD-10-CM | POA: Diagnosis not present

## 2018-08-16 DIAGNOSIS — I350 Nonrheumatic aortic (valve) stenosis: Secondary | ICD-10-CM | POA: Diagnosis not present

## 2018-08-16 DIAGNOSIS — I739 Peripheral vascular disease, unspecified: Secondary | ICD-10-CM | POA: Diagnosis not present

## 2018-08-24 DIAGNOSIS — I10 Essential (primary) hypertension: Secondary | ICD-10-CM | POA: Diagnosis not present

## 2018-08-24 DIAGNOSIS — Z951 Presence of aortocoronary bypass graft: Secondary | ICD-10-CM | POA: Diagnosis not present

## 2018-08-24 DIAGNOSIS — I4510 Unspecified right bundle-branch block: Secondary | ICD-10-CM | POA: Diagnosis not present

## 2018-08-24 DIAGNOSIS — R9431 Abnormal electrocardiogram [ECG] [EKG]: Secondary | ICD-10-CM | POA: Diagnosis not present

## 2018-08-24 DIAGNOSIS — I350 Nonrheumatic aortic (valve) stenosis: Secondary | ICD-10-CM | POA: Diagnosis not present

## 2018-08-28 DIAGNOSIS — Z01812 Encounter for preprocedural laboratory examination: Secondary | ICD-10-CM | POA: Diagnosis not present

## 2018-08-28 DIAGNOSIS — I2510 Atherosclerotic heart disease of native coronary artery without angina pectoris: Secondary | ICD-10-CM | POA: Diagnosis not present

## 2018-08-28 DIAGNOSIS — Z1159 Encounter for screening for other viral diseases: Secondary | ICD-10-CM | POA: Diagnosis not present

## 2018-08-28 DIAGNOSIS — I4510 Unspecified right bundle-branch block: Secondary | ICD-10-CM | POA: Diagnosis not present

## 2018-08-28 DIAGNOSIS — I739 Peripheral vascular disease, unspecified: Secondary | ICD-10-CM | POA: Diagnosis not present

## 2018-08-28 DIAGNOSIS — I350 Nonrheumatic aortic (valve) stenosis: Secondary | ICD-10-CM | POA: Diagnosis not present

## 2018-08-31 DIAGNOSIS — E785 Hyperlipidemia, unspecified: Secondary | ICD-10-CM | POA: Diagnosis not present

## 2018-08-31 DIAGNOSIS — N183 Chronic kidney disease, stage 3 (moderate): Secondary | ICD-10-CM | POA: Diagnosis not present

## 2018-08-31 DIAGNOSIS — Z7982 Long term (current) use of aspirin: Secondary | ICD-10-CM | POA: Diagnosis not present

## 2018-08-31 DIAGNOSIS — K219 Gastro-esophageal reflux disease without esophagitis: Secondary | ICD-10-CM | POA: Diagnosis not present

## 2018-08-31 DIAGNOSIS — I129 Hypertensive chronic kidney disease with stage 1 through stage 4 chronic kidney disease, or unspecified chronic kidney disease: Secondary | ICD-10-CM | POA: Diagnosis not present

## 2018-08-31 DIAGNOSIS — Z89422 Acquired absence of other left toe(s): Secondary | ICD-10-CM | POA: Diagnosis not present

## 2018-08-31 DIAGNOSIS — I2510 Atherosclerotic heart disease of native coronary artery without angina pectoris: Secondary | ICD-10-CM | POA: Diagnosis not present

## 2018-08-31 DIAGNOSIS — G4733 Obstructive sleep apnea (adult) (pediatric): Secondary | ICD-10-CM | POA: Diagnosis not present

## 2018-08-31 DIAGNOSIS — Z7951 Long term (current) use of inhaled steroids: Secondary | ICD-10-CM | POA: Diagnosis not present

## 2018-08-31 DIAGNOSIS — Z7902 Long term (current) use of antithrombotics/antiplatelets: Secondary | ICD-10-CM | POA: Diagnosis not present

## 2018-08-31 DIAGNOSIS — I350 Nonrheumatic aortic (valve) stenosis: Secondary | ICD-10-CM | POA: Diagnosis not present

## 2018-08-31 DIAGNOSIS — I739 Peripheral vascular disease, unspecified: Secondary | ICD-10-CM | POA: Diagnosis not present

## 2018-08-31 DIAGNOSIS — E1122 Type 2 diabetes mellitus with diabetic chronic kidney disease: Secondary | ICD-10-CM | POA: Diagnosis not present

## 2018-08-31 DIAGNOSIS — Z951 Presence of aortocoronary bypass graft: Secondary | ICD-10-CM | POA: Diagnosis not present

## 2018-08-31 DIAGNOSIS — Z79899 Other long term (current) drug therapy: Secondary | ICD-10-CM | POA: Diagnosis not present

## 2018-08-31 DIAGNOSIS — Z85820 Personal history of malignant melanoma of skin: Secondary | ICD-10-CM | POA: Diagnosis not present

## 2018-08-31 DIAGNOSIS — Z794 Long term (current) use of insulin: Secondary | ICD-10-CM | POA: Diagnosis not present

## 2018-08-31 DIAGNOSIS — I4510 Unspecified right bundle-branch block: Secondary | ICD-10-CM | POA: Diagnosis not present

## 2018-08-31 DIAGNOSIS — Z955 Presence of coronary angioplasty implant and graft: Secondary | ICD-10-CM | POA: Diagnosis not present

## 2018-08-31 DIAGNOSIS — E1151 Type 2 diabetes mellitus with diabetic peripheral angiopathy without gangrene: Secondary | ICD-10-CM | POA: Diagnosis not present

## 2018-09-01 DIAGNOSIS — N183 Chronic kidney disease, stage 3 (moderate): Secondary | ICD-10-CM | POA: Diagnosis not present

## 2018-09-01 DIAGNOSIS — Z955 Presence of coronary angioplasty implant and graft: Secondary | ICD-10-CM | POA: Diagnosis not present

## 2018-09-01 DIAGNOSIS — Z951 Presence of aortocoronary bypass graft: Secondary | ICD-10-CM | POA: Diagnosis not present

## 2018-09-01 DIAGNOSIS — Z01810 Encounter for preprocedural cardiovascular examination: Secondary | ICD-10-CM | POA: Diagnosis not present

## 2018-09-01 DIAGNOSIS — E1122 Type 2 diabetes mellitus with diabetic chronic kidney disease: Secondary | ICD-10-CM | POA: Diagnosis not present

## 2018-09-01 DIAGNOSIS — Z794 Long term (current) use of insulin: Secondary | ICD-10-CM | POA: Diagnosis not present

## 2018-09-01 DIAGNOSIS — E11621 Type 2 diabetes mellitus with foot ulcer: Secondary | ICD-10-CM | POA: Diagnosis not present

## 2018-09-01 DIAGNOSIS — E1151 Type 2 diabetes mellitus with diabetic peripheral angiopathy without gangrene: Secondary | ICD-10-CM | POA: Diagnosis not present

## 2018-09-01 DIAGNOSIS — I6523 Occlusion and stenosis of bilateral carotid arteries: Secondary | ICD-10-CM | POA: Diagnosis not present

## 2018-09-01 DIAGNOSIS — I350 Nonrheumatic aortic (valve) stenosis: Secondary | ICD-10-CM | POA: Diagnosis not present

## 2018-09-01 DIAGNOSIS — I2510 Atherosclerotic heart disease of native coronary artery without angina pectoris: Secondary | ICD-10-CM | POA: Diagnosis not present

## 2018-09-01 DIAGNOSIS — L97519 Non-pressure chronic ulcer of other part of right foot with unspecified severity: Secondary | ICD-10-CM | POA: Diagnosis not present

## 2018-09-01 DIAGNOSIS — Z01818 Encounter for other preprocedural examination: Secondary | ICD-10-CM | POA: Diagnosis not present

## 2018-09-06 DIAGNOSIS — G4733 Obstructive sleep apnea (adult) (pediatric): Secondary | ICD-10-CM | POA: Diagnosis not present

## 2018-09-11 DIAGNOSIS — L570 Actinic keratosis: Secondary | ICD-10-CM | POA: Diagnosis not present

## 2018-09-11 DIAGNOSIS — L219 Seborrheic dermatitis, unspecified: Secondary | ICD-10-CM | POA: Diagnosis not present

## 2018-09-11 DIAGNOSIS — Z85828 Personal history of other malignant neoplasm of skin: Secondary | ICD-10-CM | POA: Diagnosis not present

## 2018-09-19 DIAGNOSIS — E1151 Type 2 diabetes mellitus with diabetic peripheral angiopathy without gangrene: Secondary | ICD-10-CM | POA: Diagnosis not present

## 2018-09-19 DIAGNOSIS — E1140 Type 2 diabetes mellitus with diabetic neuropathy, unspecified: Secondary | ICD-10-CM | POA: Diagnosis not present

## 2018-09-19 DIAGNOSIS — L89892 Pressure ulcer of other site, stage 2: Secondary | ICD-10-CM | POA: Diagnosis not present

## 2018-09-27 DIAGNOSIS — E1151 Type 2 diabetes mellitus with diabetic peripheral angiopathy without gangrene: Secondary | ICD-10-CM | POA: Diagnosis not present

## 2018-09-27 DIAGNOSIS — E1140 Type 2 diabetes mellitus with diabetic neuropathy, unspecified: Secondary | ICD-10-CM | POA: Diagnosis not present

## 2018-09-27 DIAGNOSIS — L89892 Pressure ulcer of other site, stage 2: Secondary | ICD-10-CM | POA: Diagnosis not present

## 2018-10-05 DIAGNOSIS — L89892 Pressure ulcer of other site, stage 2: Secondary | ICD-10-CM | POA: Diagnosis not present

## 2018-10-05 DIAGNOSIS — E1140 Type 2 diabetes mellitus with diabetic neuropathy, unspecified: Secondary | ICD-10-CM | POA: Diagnosis not present

## 2018-10-09 DIAGNOSIS — E039 Hypothyroidism, unspecified: Secondary | ICD-10-CM | POA: Diagnosis not present

## 2018-10-09 DIAGNOSIS — I10 Essential (primary) hypertension: Secondary | ICD-10-CM | POA: Diagnosis not present

## 2018-10-11 DIAGNOSIS — I350 Nonrheumatic aortic (valve) stenosis: Secondary | ICD-10-CM | POA: Diagnosis not present

## 2018-10-11 DIAGNOSIS — Z951 Presence of aortocoronary bypass graft: Secondary | ICD-10-CM | POA: Diagnosis not present

## 2018-10-11 DIAGNOSIS — I10 Essential (primary) hypertension: Secondary | ICD-10-CM | POA: Diagnosis not present

## 2018-10-12 DIAGNOSIS — Z01818 Encounter for other preprocedural examination: Secondary | ICD-10-CM | POA: Diagnosis not present

## 2018-10-12 DIAGNOSIS — I350 Nonrheumatic aortic (valve) stenosis: Secondary | ICD-10-CM | POA: Diagnosis not present

## 2018-10-12 DIAGNOSIS — Z1159 Encounter for screening for other viral diseases: Secondary | ICD-10-CM | POA: Diagnosis not present

## 2018-10-12 DIAGNOSIS — E119 Type 2 diabetes mellitus without complications: Secondary | ICD-10-CM | POA: Diagnosis not present

## 2018-10-12 DIAGNOSIS — Z86711 Personal history of pulmonary embolism: Secondary | ICD-10-CM | POA: Diagnosis not present

## 2018-10-12 DIAGNOSIS — I10 Essential (primary) hypertension: Secondary | ICD-10-CM | POA: Diagnosis not present

## 2018-10-12 DIAGNOSIS — I2510 Atherosclerotic heart disease of native coronary artery without angina pectoris: Secondary | ICD-10-CM | POA: Diagnosis not present

## 2018-10-12 DIAGNOSIS — Z01812 Encounter for preprocedural laboratory examination: Secondary | ICD-10-CM | POA: Diagnosis not present

## 2018-10-17 DIAGNOSIS — E1140 Type 2 diabetes mellitus with diabetic neuropathy, unspecified: Secondary | ICD-10-CM | POA: Diagnosis present

## 2018-10-17 DIAGNOSIS — R0602 Shortness of breath: Secondary | ICD-10-CM | POA: Diagnosis not present

## 2018-10-17 DIAGNOSIS — H9190 Unspecified hearing loss, unspecified ear: Secondary | ICD-10-CM | POA: Diagnosis present

## 2018-10-17 DIAGNOSIS — E7800 Pure hypercholesterolemia, unspecified: Secondary | ICD-10-CM | POA: Diagnosis present

## 2018-10-17 DIAGNOSIS — Z794 Long term (current) use of insulin: Secondary | ICD-10-CM | POA: Diagnosis not present

## 2018-10-17 DIAGNOSIS — I517 Cardiomegaly: Secondary | ICD-10-CM | POA: Diagnosis not present

## 2018-10-17 DIAGNOSIS — E669 Obesity, unspecified: Secondary | ICD-10-CM | POA: Diagnosis present

## 2018-10-17 DIAGNOSIS — Z48812 Encounter for surgical aftercare following surgery on the circulatory system: Secondary | ICD-10-CM | POA: Diagnosis not present

## 2018-10-17 DIAGNOSIS — I2510 Atherosclerotic heart disease of native coronary artery without angina pectoris: Secondary | ICD-10-CM | POA: Diagnosis present

## 2018-10-17 DIAGNOSIS — I129 Hypertensive chronic kidney disease with stage 1 through stage 4 chronic kidney disease, or unspecified chronic kidney disease: Secondary | ICD-10-CM | POA: Diagnosis present

## 2018-10-17 DIAGNOSIS — N183 Chronic kidney disease, stage 3 (moderate): Secondary | ICD-10-CM | POA: Diagnosis present

## 2018-10-17 DIAGNOSIS — K219 Gastro-esophageal reflux disease without esophagitis: Secondary | ICD-10-CM | POA: Diagnosis present

## 2018-10-17 DIAGNOSIS — G4733 Obstructive sleep apnea (adult) (pediatric): Secondary | ICD-10-CM | POA: Diagnosis present

## 2018-10-17 DIAGNOSIS — I350 Nonrheumatic aortic (valve) stenosis: Secondary | ICD-10-CM | POA: Diagnosis present

## 2018-10-17 DIAGNOSIS — Z7982 Long term (current) use of aspirin: Secondary | ICD-10-CM | POA: Diagnosis not present

## 2018-10-17 DIAGNOSIS — E1151 Type 2 diabetes mellitus with diabetic peripheral angiopathy without gangrene: Secondary | ICD-10-CM | POA: Diagnosis present

## 2018-10-17 DIAGNOSIS — E1122 Type 2 diabetes mellitus with diabetic chronic kidney disease: Secondary | ICD-10-CM | POA: Diagnosis present

## 2018-10-17 DIAGNOSIS — Z951 Presence of aortocoronary bypass graft: Secondary | ICD-10-CM | POA: Diagnosis not present

## 2018-10-17 DIAGNOSIS — Z7902 Long term (current) use of antithrombotics/antiplatelets: Secondary | ICD-10-CM | POA: Diagnosis not present

## 2018-10-17 DIAGNOSIS — E039 Hypothyroidism, unspecified: Secondary | ICD-10-CM | POA: Diagnosis present

## 2018-10-17 DIAGNOSIS — I519 Heart disease, unspecified: Secondary | ICD-10-CM | POA: Diagnosis not present

## 2018-10-17 DIAGNOSIS — Z6830 Body mass index (BMI) 30.0-30.9, adult: Secondary | ICD-10-CM | POA: Diagnosis not present

## 2018-10-17 DIAGNOSIS — I081 Rheumatic disorders of both mitral and tricuspid valves: Secondary | ICD-10-CM | POA: Diagnosis not present

## 2018-10-17 DIAGNOSIS — Z79899 Other long term (current) drug therapy: Secondary | ICD-10-CM | POA: Diagnosis not present

## 2018-10-17 DIAGNOSIS — Z006 Encounter for examination for normal comparison and control in clinical research program: Secondary | ICD-10-CM | POA: Diagnosis not present

## 2018-10-17 DIAGNOSIS — R0989 Other specified symptoms and signs involving the circulatory and respiratory systems: Secondary | ICD-10-CM | POA: Diagnosis not present

## 2018-10-17 DIAGNOSIS — Z89431 Acquired absence of right foot: Secondary | ICD-10-CM | POA: Diagnosis not present

## 2018-10-17 DIAGNOSIS — Z952 Presence of prosthetic heart valve: Secondary | ICD-10-CM | POA: Diagnosis not present

## 2018-10-24 DIAGNOSIS — M79671 Pain in right foot: Secondary | ICD-10-CM | POA: Diagnosis not present

## 2018-10-24 DIAGNOSIS — E1140 Type 2 diabetes mellitus with diabetic neuropathy, unspecified: Secondary | ICD-10-CM | POA: Diagnosis not present

## 2018-10-24 DIAGNOSIS — M79672 Pain in left foot: Secondary | ICD-10-CM | POA: Diagnosis not present

## 2018-10-24 DIAGNOSIS — L89892 Pressure ulcer of other site, stage 2: Secondary | ICD-10-CM | POA: Diagnosis not present

## 2018-10-24 DIAGNOSIS — I739 Peripheral vascular disease, unspecified: Secondary | ICD-10-CM | POA: Diagnosis not present

## 2018-10-26 DIAGNOSIS — I10 Essential (primary) hypertension: Secondary | ICD-10-CM | POA: Diagnosis not present

## 2018-10-26 DIAGNOSIS — L97512 Non-pressure chronic ulcer of other part of right foot with fat layer exposed: Secondary | ICD-10-CM | POA: Diagnosis not present

## 2018-10-26 DIAGNOSIS — E1140 Type 2 diabetes mellitus with diabetic neuropathy, unspecified: Secondary | ICD-10-CM | POA: Diagnosis not present

## 2018-10-26 DIAGNOSIS — I951 Orthostatic hypotension: Secondary | ICD-10-CM | POA: Diagnosis not present

## 2018-10-26 DIAGNOSIS — N183 Chronic kidney disease, stage 3 (moderate): Secondary | ICD-10-CM | POA: Diagnosis not present

## 2018-10-26 DIAGNOSIS — Z48812 Encounter for surgical aftercare following surgery on the circulatory system: Secondary | ICD-10-CM | POA: Diagnosis not present

## 2018-10-26 DIAGNOSIS — Z951 Presence of aortocoronary bypass graft: Secondary | ICD-10-CM | POA: Diagnosis not present

## 2018-10-26 DIAGNOSIS — I2510 Atherosclerotic heart disease of native coronary artery without angina pectoris: Secondary | ICD-10-CM | POA: Diagnosis not present

## 2018-10-26 DIAGNOSIS — I350 Nonrheumatic aortic (valve) stenosis: Secondary | ICD-10-CM | POA: Diagnosis not present

## 2018-10-26 DIAGNOSIS — Z952 Presence of prosthetic heart valve: Secondary | ICD-10-CM | POA: Diagnosis not present

## 2018-10-26 DIAGNOSIS — Z1159 Encounter for screening for other viral diseases: Secondary | ICD-10-CM | POA: Diagnosis not present

## 2018-10-30 DIAGNOSIS — I739 Peripheral vascular disease, unspecified: Secondary | ICD-10-CM | POA: Diagnosis not present

## 2018-10-30 DIAGNOSIS — L97512 Non-pressure chronic ulcer of other part of right foot with fat layer exposed: Secondary | ICD-10-CM | POA: Diagnosis not present

## 2018-10-30 DIAGNOSIS — E11622 Type 2 diabetes mellitus with other skin ulcer: Secondary | ICD-10-CM | POA: Diagnosis not present

## 2018-10-30 DIAGNOSIS — Z89431 Acquired absence of right foot: Secondary | ICD-10-CM | POA: Diagnosis not present

## 2018-10-30 DIAGNOSIS — E1140 Type 2 diabetes mellitus with diabetic neuropathy, unspecified: Secondary | ICD-10-CM | POA: Diagnosis not present

## 2018-10-31 DIAGNOSIS — Z952 Presence of prosthetic heart valve: Secondary | ICD-10-CM | POA: Diagnosis not present

## 2018-10-31 DIAGNOSIS — Z6831 Body mass index (BMI) 31.0-31.9, adult: Secondary | ICD-10-CM | POA: Diagnosis not present

## 2018-11-01 DIAGNOSIS — E1140 Type 2 diabetes mellitus with diabetic neuropathy, unspecified: Secondary | ICD-10-CM | POA: Diagnosis not present

## 2018-11-01 DIAGNOSIS — E039 Hypothyroidism, unspecified: Secondary | ICD-10-CM | POA: Diagnosis not present

## 2018-11-03 DIAGNOSIS — E1140 Type 2 diabetes mellitus with diabetic neuropathy, unspecified: Secondary | ICD-10-CM | POA: Diagnosis not present

## 2018-11-03 DIAGNOSIS — L97512 Non-pressure chronic ulcer of other part of right foot with fat layer exposed: Secondary | ICD-10-CM | POA: Diagnosis not present

## 2018-11-07 DIAGNOSIS — I25118 Atherosclerotic heart disease of native coronary artery with other forms of angina pectoris: Secondary | ICD-10-CM | POA: Diagnosis not present

## 2018-11-07 DIAGNOSIS — G4733 Obstructive sleep apnea (adult) (pediatric): Secondary | ICD-10-CM | POA: Diagnosis not present

## 2018-11-07 DIAGNOSIS — I10 Essential (primary) hypertension: Secondary | ICD-10-CM | POA: Diagnosis not present

## 2018-11-07 DIAGNOSIS — G4710 Hypersomnia, unspecified: Secondary | ICD-10-CM | POA: Diagnosis not present

## 2018-11-07 DIAGNOSIS — Z9989 Dependence on other enabling machines and devices: Secondary | ICD-10-CM | POA: Diagnosis not present

## 2018-11-07 DIAGNOSIS — G4700 Insomnia, unspecified: Secondary | ICD-10-CM | POA: Diagnosis not present

## 2018-11-09 DIAGNOSIS — Z952 Presence of prosthetic heart valve: Secondary | ICD-10-CM | POA: Diagnosis not present

## 2018-11-09 DIAGNOSIS — I10 Essential (primary) hypertension: Secondary | ICD-10-CM | POA: Diagnosis not present

## 2018-11-10 DIAGNOSIS — E782 Mixed hyperlipidemia: Secondary | ICD-10-CM | POA: Diagnosis not present

## 2018-11-10 DIAGNOSIS — E1140 Type 2 diabetes mellitus with diabetic neuropathy, unspecified: Secondary | ICD-10-CM | POA: Diagnosis not present

## 2018-11-10 DIAGNOSIS — E7800 Pure hypercholesterolemia, unspecified: Secondary | ICD-10-CM | POA: Diagnosis not present

## 2018-11-10 DIAGNOSIS — I10 Essential (primary) hypertension: Secondary | ICD-10-CM | POA: Diagnosis not present

## 2018-11-10 DIAGNOSIS — L97512 Non-pressure chronic ulcer of other part of right foot with fat layer exposed: Secondary | ICD-10-CM | POA: Diagnosis not present

## 2018-11-16 DIAGNOSIS — L97512 Non-pressure chronic ulcer of other part of right foot with fat layer exposed: Secondary | ICD-10-CM | POA: Diagnosis not present

## 2018-11-16 DIAGNOSIS — E1140 Type 2 diabetes mellitus with diabetic neuropathy, unspecified: Secondary | ICD-10-CM | POA: Diagnosis not present

## 2018-11-17 DIAGNOSIS — I519 Heart disease, unspecified: Secondary | ICD-10-CM | POA: Diagnosis not present

## 2018-11-17 DIAGNOSIS — Z953 Presence of xenogenic heart valve: Secondary | ICD-10-CM | POA: Diagnosis not present

## 2018-11-17 DIAGNOSIS — I350 Nonrheumatic aortic (valve) stenosis: Secondary | ICD-10-CM | POA: Diagnosis not present

## 2018-11-17 DIAGNOSIS — I2510 Atherosclerotic heart disease of native coronary artery without angina pectoris: Secondary | ICD-10-CM | POA: Diagnosis not present

## 2018-11-17 DIAGNOSIS — I081 Rheumatic disorders of both mitral and tricuspid valves: Secondary | ICD-10-CM | POA: Diagnosis not present

## 2018-11-17 DIAGNOSIS — I10 Essential (primary) hypertension: Secondary | ICD-10-CM | POA: Diagnosis not present

## 2018-11-17 DIAGNOSIS — I517 Cardiomegaly: Secondary | ICD-10-CM | POA: Diagnosis not present

## 2018-11-17 DIAGNOSIS — Z951 Presence of aortocoronary bypass graft: Secondary | ICD-10-CM | POA: Diagnosis not present

## 2018-11-24 DIAGNOSIS — E1140 Type 2 diabetes mellitus with diabetic neuropathy, unspecified: Secondary | ICD-10-CM | POA: Diagnosis not present

## 2018-11-24 DIAGNOSIS — L97512 Non-pressure chronic ulcer of other part of right foot with fat layer exposed: Secondary | ICD-10-CM | POA: Diagnosis not present

## 2018-12-01 DIAGNOSIS — L97512 Non-pressure chronic ulcer of other part of right foot with fat layer exposed: Secondary | ICD-10-CM | POA: Diagnosis not present

## 2018-12-01 DIAGNOSIS — E1140 Type 2 diabetes mellitus with diabetic neuropathy, unspecified: Secondary | ICD-10-CM | POA: Diagnosis not present

## 2018-12-04 DIAGNOSIS — J0100 Acute maxillary sinusitis, unspecified: Secondary | ICD-10-CM | POA: Diagnosis not present

## 2018-12-04 DIAGNOSIS — H6122 Impacted cerumen, left ear: Secondary | ICD-10-CM | POA: Diagnosis not present

## 2018-12-04 DIAGNOSIS — H903 Sensorineural hearing loss, bilateral: Secondary | ICD-10-CM | POA: Diagnosis not present

## 2018-12-06 DIAGNOSIS — I10 Essential (primary) hypertension: Secondary | ICD-10-CM | POA: Diagnosis not present

## 2018-12-06 DIAGNOSIS — Z794 Long term (current) use of insulin: Secondary | ICD-10-CM | POA: Diagnosis not present

## 2018-12-06 DIAGNOSIS — D649 Anemia, unspecified: Secondary | ICD-10-CM | POA: Diagnosis not present

## 2018-12-06 DIAGNOSIS — E1122 Type 2 diabetes mellitus with diabetic chronic kidney disease: Secondary | ICD-10-CM | POA: Diagnosis not present

## 2018-12-06 DIAGNOSIS — N183 Chronic kidney disease, stage 3 (moderate): Secondary | ICD-10-CM | POA: Diagnosis not present

## 2018-12-11 DIAGNOSIS — E782 Mixed hyperlipidemia: Secondary | ICD-10-CM | POA: Diagnosis not present

## 2018-12-11 DIAGNOSIS — E039 Hypothyroidism, unspecified: Secondary | ICD-10-CM | POA: Diagnosis not present

## 2018-12-11 DIAGNOSIS — I10 Essential (primary) hypertension: Secondary | ICD-10-CM | POA: Diagnosis not present

## 2018-12-15 DIAGNOSIS — L97512 Non-pressure chronic ulcer of other part of right foot with fat layer exposed: Secondary | ICD-10-CM | POA: Diagnosis not present

## 2018-12-15 DIAGNOSIS — E1140 Type 2 diabetes mellitus with diabetic neuropathy, unspecified: Secondary | ICD-10-CM | POA: Diagnosis not present

## 2018-12-21 DIAGNOSIS — N183 Chronic kidney disease, stage 3 (moderate): Secondary | ICD-10-CM | POA: Diagnosis not present

## 2018-12-21 DIAGNOSIS — R944 Abnormal results of kidney function studies: Secondary | ICD-10-CM | POA: Diagnosis not present

## 2018-12-22 DIAGNOSIS — L97512 Non-pressure chronic ulcer of other part of right foot with fat layer exposed: Secondary | ICD-10-CM | POA: Diagnosis not present

## 2018-12-22 DIAGNOSIS — E1140 Type 2 diabetes mellitus with diabetic neuropathy, unspecified: Secondary | ICD-10-CM | POA: Diagnosis not present

## 2018-12-27 DIAGNOSIS — M79672 Pain in left foot: Secondary | ICD-10-CM | POA: Diagnosis not present

## 2018-12-27 DIAGNOSIS — M79671 Pain in right foot: Secondary | ICD-10-CM | POA: Diagnosis not present

## 2018-12-27 DIAGNOSIS — E1140 Type 2 diabetes mellitus with diabetic neuropathy, unspecified: Secondary | ICD-10-CM | POA: Diagnosis not present

## 2018-12-27 DIAGNOSIS — L110 Acquired keratosis follicularis: Secondary | ICD-10-CM | POA: Diagnosis not present

## 2018-12-27 DIAGNOSIS — I739 Peripheral vascular disease, unspecified: Secondary | ICD-10-CM | POA: Diagnosis not present

## 2018-12-29 DIAGNOSIS — E1140 Type 2 diabetes mellitus with diabetic neuropathy, unspecified: Secondary | ICD-10-CM | POA: Diagnosis not present

## 2018-12-29 DIAGNOSIS — L97512 Non-pressure chronic ulcer of other part of right foot with fat layer exposed: Secondary | ICD-10-CM | POA: Diagnosis not present

## 2019-01-05 DIAGNOSIS — L97512 Non-pressure chronic ulcer of other part of right foot with fat layer exposed: Secondary | ICD-10-CM | POA: Diagnosis not present

## 2019-01-05 DIAGNOSIS — E1140 Type 2 diabetes mellitus with diabetic neuropathy, unspecified: Secondary | ICD-10-CM | POA: Diagnosis not present

## 2019-01-08 DIAGNOSIS — G4710 Hypersomnia, unspecified: Secondary | ICD-10-CM | POA: Diagnosis not present

## 2019-01-08 DIAGNOSIS — I25118 Atherosclerotic heart disease of native coronary artery with other forms of angina pectoris: Secondary | ICD-10-CM | POA: Diagnosis not present

## 2019-01-08 DIAGNOSIS — G4733 Obstructive sleep apnea (adult) (pediatric): Secondary | ICD-10-CM | POA: Diagnosis not present

## 2019-01-08 DIAGNOSIS — Z9989 Dependence on other enabling machines and devices: Secondary | ICD-10-CM | POA: Diagnosis not present

## 2019-01-08 DIAGNOSIS — I10 Essential (primary) hypertension: Secondary | ICD-10-CM | POA: Diagnosis not present

## 2019-01-10 DIAGNOSIS — D510 Vitamin B12 deficiency anemia due to intrinsic factor deficiency: Secondary | ICD-10-CM | POA: Diagnosis not present

## 2019-01-10 DIAGNOSIS — E559 Vitamin D deficiency, unspecified: Secondary | ICD-10-CM | POA: Diagnosis not present

## 2019-01-10 DIAGNOSIS — I10 Essential (primary) hypertension: Secondary | ICD-10-CM | POA: Diagnosis not present

## 2019-01-10 DIAGNOSIS — E039 Hypothyroidism, unspecified: Secondary | ICD-10-CM | POA: Diagnosis not present

## 2019-01-10 DIAGNOSIS — E1165 Type 2 diabetes mellitus with hyperglycemia: Secondary | ICD-10-CM | POA: Diagnosis not present

## 2019-01-11 DIAGNOSIS — E1140 Type 2 diabetes mellitus with diabetic neuropathy, unspecified: Secondary | ICD-10-CM | POA: Diagnosis not present

## 2019-01-11 DIAGNOSIS — L97512 Non-pressure chronic ulcer of other part of right foot with fat layer exposed: Secondary | ICD-10-CM | POA: Diagnosis not present

## 2019-01-18 DIAGNOSIS — E1140 Type 2 diabetes mellitus with diabetic neuropathy, unspecified: Secondary | ICD-10-CM | POA: Diagnosis not present

## 2019-01-18 DIAGNOSIS — L97512 Non-pressure chronic ulcer of other part of right foot with fat layer exposed: Secondary | ICD-10-CM | POA: Diagnosis not present

## 2019-01-26 DIAGNOSIS — L97512 Non-pressure chronic ulcer of other part of right foot with fat layer exposed: Secondary | ICD-10-CM | POA: Diagnosis not present

## 2019-01-26 DIAGNOSIS — E1140 Type 2 diabetes mellitus with diabetic neuropathy, unspecified: Secondary | ICD-10-CM | POA: Diagnosis not present

## 2019-02-01 DIAGNOSIS — E11621 Type 2 diabetes mellitus with foot ulcer: Secondary | ICD-10-CM | POA: Diagnosis not present

## 2019-02-01 DIAGNOSIS — E782 Mixed hyperlipidemia: Secondary | ICD-10-CM | POA: Diagnosis not present

## 2019-02-01 DIAGNOSIS — E1122 Type 2 diabetes mellitus with diabetic chronic kidney disease: Secondary | ICD-10-CM | POA: Diagnosis not present

## 2019-02-01 DIAGNOSIS — E1165 Type 2 diabetes mellitus with hyperglycemia: Secondary | ICD-10-CM | POA: Diagnosis not present

## 2019-02-01 DIAGNOSIS — I10 Essential (primary) hypertension: Secondary | ICD-10-CM | POA: Diagnosis not present

## 2019-02-01 DIAGNOSIS — E7800 Pure hypercholesterolemia, unspecified: Secondary | ICD-10-CM | POA: Diagnosis not present

## 2019-02-01 DIAGNOSIS — E1143 Type 2 diabetes mellitus with diabetic autonomic (poly)neuropathy: Secondary | ICD-10-CM | POA: Diagnosis not present

## 2019-02-01 DIAGNOSIS — I350 Nonrheumatic aortic (valve) stenosis: Secondary | ICD-10-CM | POA: Diagnosis not present

## 2019-02-01 DIAGNOSIS — E039 Hypothyroidism, unspecified: Secondary | ICD-10-CM | POA: Diagnosis not present

## 2019-02-01 DIAGNOSIS — E11618 Type 2 diabetes mellitus with other diabetic arthropathy: Secondary | ICD-10-CM | POA: Diagnosis not present

## 2019-02-01 DIAGNOSIS — N184 Chronic kidney disease, stage 4 (severe): Secondary | ICD-10-CM | POA: Diagnosis not present

## 2019-02-06 DIAGNOSIS — Z23 Encounter for immunization: Secondary | ICD-10-CM | POA: Diagnosis not present

## 2019-02-06 DIAGNOSIS — E1165 Type 2 diabetes mellitus with hyperglycemia: Secondary | ICD-10-CM | POA: Diagnosis not present

## 2019-02-06 DIAGNOSIS — E11621 Type 2 diabetes mellitus with foot ulcer: Secondary | ICD-10-CM | POA: Diagnosis not present

## 2019-02-06 DIAGNOSIS — I350 Nonrheumatic aortic (valve) stenosis: Secondary | ICD-10-CM | POA: Diagnosis not present

## 2019-02-06 DIAGNOSIS — E1143 Type 2 diabetes mellitus with diabetic autonomic (poly)neuropathy: Secondary | ICD-10-CM | POA: Diagnosis not present

## 2019-02-06 DIAGNOSIS — Z6830 Body mass index (BMI) 30.0-30.9, adult: Secondary | ICD-10-CM | POA: Diagnosis not present

## 2019-02-06 DIAGNOSIS — E1122 Type 2 diabetes mellitus with diabetic chronic kidney disease: Secondary | ICD-10-CM | POA: Diagnosis not present

## 2019-02-06 DIAGNOSIS — I10 Essential (primary) hypertension: Secondary | ICD-10-CM | POA: Diagnosis not present

## 2019-02-09 DIAGNOSIS — I10 Essential (primary) hypertension: Secondary | ICD-10-CM | POA: Diagnosis not present

## 2019-02-09 DIAGNOSIS — E1140 Type 2 diabetes mellitus with diabetic neuropathy, unspecified: Secondary | ICD-10-CM | POA: Diagnosis not present

## 2019-02-09 DIAGNOSIS — E782 Mixed hyperlipidemia: Secondary | ICD-10-CM | POA: Diagnosis not present

## 2019-02-09 DIAGNOSIS — L97512 Non-pressure chronic ulcer of other part of right foot with fat layer exposed: Secondary | ICD-10-CM | POA: Diagnosis not present

## 2019-02-15 DIAGNOSIS — Z952 Presence of prosthetic heart valve: Secondary | ICD-10-CM | POA: Diagnosis not present

## 2019-02-15 DIAGNOSIS — Z888 Allergy status to other drugs, medicaments and biological substances status: Secondary | ICD-10-CM | POA: Diagnosis not present

## 2019-02-15 DIAGNOSIS — I739 Peripheral vascular disease, unspecified: Secondary | ICD-10-CM | POA: Diagnosis not present

## 2019-02-15 DIAGNOSIS — Z955 Presence of coronary angioplasty implant and graft: Secondary | ICD-10-CM | POA: Diagnosis not present

## 2019-02-15 DIAGNOSIS — L84 Corns and callosities: Secondary | ICD-10-CM | POA: Diagnosis not present

## 2019-02-15 DIAGNOSIS — Z951 Presence of aortocoronary bypass graft: Secondary | ICD-10-CM | POA: Diagnosis not present

## 2019-02-15 DIAGNOSIS — Z89422 Acquired absence of other left toe(s): Secondary | ICD-10-CM | POA: Diagnosis not present

## 2019-02-15 DIAGNOSIS — L97529 Non-pressure chronic ulcer of other part of left foot with unspecified severity: Secondary | ICD-10-CM | POA: Diagnosis not present

## 2019-02-15 DIAGNOSIS — E11621 Type 2 diabetes mellitus with foot ulcer: Secondary | ICD-10-CM | POA: Diagnosis not present

## 2019-02-15 DIAGNOSIS — I519 Heart disease, unspecified: Secondary | ICD-10-CM | POA: Diagnosis not present

## 2019-02-15 DIAGNOSIS — L97418 Non-pressure chronic ulcer of right heel and midfoot with other specified severity: Secondary | ICD-10-CM | POA: Diagnosis not present

## 2019-02-22 DIAGNOSIS — M79672 Pain in left foot: Secondary | ICD-10-CM | POA: Diagnosis not present

## 2019-02-22 DIAGNOSIS — I739 Peripheral vascular disease, unspecified: Secondary | ICD-10-CM | POA: Diagnosis not present

## 2019-02-22 DIAGNOSIS — L110 Acquired keratosis follicularis: Secondary | ICD-10-CM | POA: Diagnosis not present

## 2019-02-22 DIAGNOSIS — E1140 Type 2 diabetes mellitus with diabetic neuropathy, unspecified: Secondary | ICD-10-CM | POA: Diagnosis not present

## 2019-02-22 DIAGNOSIS — M79671 Pain in right foot: Secondary | ICD-10-CM | POA: Diagnosis not present

## 2019-02-23 DIAGNOSIS — I350 Nonrheumatic aortic (valve) stenosis: Secondary | ICD-10-CM | POA: Diagnosis not present

## 2019-02-23 DIAGNOSIS — I10 Essential (primary) hypertension: Secondary | ICD-10-CM | POA: Diagnosis not present

## 2019-02-23 DIAGNOSIS — I2510 Atherosclerotic heart disease of native coronary artery without angina pectoris: Secondary | ICD-10-CM | POA: Diagnosis not present

## 2019-02-28 DIAGNOSIS — M9901 Segmental and somatic dysfunction of cervical region: Secondary | ICD-10-CM | POA: Diagnosis not present

## 2019-02-28 DIAGNOSIS — M9903 Segmental and somatic dysfunction of lumbar region: Secondary | ICD-10-CM | POA: Diagnosis not present

## 2019-02-28 DIAGNOSIS — M47816 Spondylosis without myelopathy or radiculopathy, lumbar region: Secondary | ICD-10-CM | POA: Diagnosis not present

## 2019-02-28 DIAGNOSIS — M47812 Spondylosis without myelopathy or radiculopathy, cervical region: Secondary | ICD-10-CM | POA: Diagnosis not present

## 2019-02-28 DIAGNOSIS — M546 Pain in thoracic spine: Secondary | ICD-10-CM | POA: Diagnosis not present

## 2019-02-28 DIAGNOSIS — M9902 Segmental and somatic dysfunction of thoracic region: Secondary | ICD-10-CM | POA: Diagnosis not present

## 2019-02-28 DIAGNOSIS — G441 Vascular headache, not elsewhere classified: Secondary | ICD-10-CM | POA: Diagnosis not present

## 2019-03-01 DIAGNOSIS — Z794 Long term (current) use of insulin: Secondary | ICD-10-CM | POA: Diagnosis not present

## 2019-03-01 DIAGNOSIS — E11621 Type 2 diabetes mellitus with foot ulcer: Secondary | ICD-10-CM | POA: Diagnosis not present

## 2019-03-01 DIAGNOSIS — E1122 Type 2 diabetes mellitus with diabetic chronic kidney disease: Secondary | ICD-10-CM | POA: Diagnosis not present

## 2019-03-01 DIAGNOSIS — N183 Chronic kidney disease, stage 3 unspecified: Secondary | ICD-10-CM | POA: Diagnosis not present

## 2019-03-01 DIAGNOSIS — Z89431 Acquired absence of right foot: Secondary | ICD-10-CM | POA: Diagnosis not present

## 2019-03-01 DIAGNOSIS — L97412 Non-pressure chronic ulcer of right heel and midfoot with fat layer exposed: Secondary | ICD-10-CM | POA: Diagnosis not present

## 2019-03-01 DIAGNOSIS — I10 Essential (primary) hypertension: Secondary | ICD-10-CM | POA: Diagnosis not present

## 2019-03-01 DIAGNOSIS — I739 Peripheral vascular disease, unspecified: Secondary | ICD-10-CM | POA: Diagnosis not present

## 2019-03-02 DIAGNOSIS — M47816 Spondylosis without myelopathy or radiculopathy, lumbar region: Secondary | ICD-10-CM | POA: Diagnosis not present

## 2019-03-02 DIAGNOSIS — M546 Pain in thoracic spine: Secondary | ICD-10-CM | POA: Diagnosis not present

## 2019-03-02 DIAGNOSIS — M9902 Segmental and somatic dysfunction of thoracic region: Secondary | ICD-10-CM | POA: Diagnosis not present

## 2019-03-02 DIAGNOSIS — G441 Vascular headache, not elsewhere classified: Secondary | ICD-10-CM | POA: Diagnosis not present

## 2019-03-02 DIAGNOSIS — M9903 Segmental and somatic dysfunction of lumbar region: Secondary | ICD-10-CM | POA: Diagnosis not present

## 2019-03-02 DIAGNOSIS — M47812 Spondylosis without myelopathy or radiculopathy, cervical region: Secondary | ICD-10-CM | POA: Diagnosis not present

## 2019-03-02 DIAGNOSIS — M9901 Segmental and somatic dysfunction of cervical region: Secondary | ICD-10-CM | POA: Diagnosis not present

## 2019-03-05 DIAGNOSIS — G441 Vascular headache, not elsewhere classified: Secondary | ICD-10-CM | POA: Diagnosis not present

## 2019-03-05 DIAGNOSIS — M47816 Spondylosis without myelopathy or radiculopathy, lumbar region: Secondary | ICD-10-CM | POA: Diagnosis not present

## 2019-03-05 DIAGNOSIS — M9903 Segmental and somatic dysfunction of lumbar region: Secondary | ICD-10-CM | POA: Diagnosis not present

## 2019-03-05 DIAGNOSIS — M9901 Segmental and somatic dysfunction of cervical region: Secondary | ICD-10-CM | POA: Diagnosis not present

## 2019-03-05 DIAGNOSIS — M546 Pain in thoracic spine: Secondary | ICD-10-CM | POA: Diagnosis not present

## 2019-03-05 DIAGNOSIS — M9902 Segmental and somatic dysfunction of thoracic region: Secondary | ICD-10-CM | POA: Diagnosis not present

## 2019-03-05 DIAGNOSIS — M47812 Spondylosis without myelopathy or radiculopathy, cervical region: Secondary | ICD-10-CM | POA: Diagnosis not present

## 2019-03-06 ENCOUNTER — Other Ambulatory Visit: Payer: Self-pay

## 2019-03-07 DIAGNOSIS — M47812 Spondylosis without myelopathy or radiculopathy, cervical region: Secondary | ICD-10-CM | POA: Diagnosis not present

## 2019-03-07 DIAGNOSIS — M47816 Spondylosis without myelopathy or radiculopathy, lumbar region: Secondary | ICD-10-CM | POA: Diagnosis not present

## 2019-03-07 DIAGNOSIS — M9903 Segmental and somatic dysfunction of lumbar region: Secondary | ICD-10-CM | POA: Diagnosis not present

## 2019-03-07 DIAGNOSIS — M9902 Segmental and somatic dysfunction of thoracic region: Secondary | ICD-10-CM | POA: Diagnosis not present

## 2019-03-07 DIAGNOSIS — M546 Pain in thoracic spine: Secondary | ICD-10-CM | POA: Diagnosis not present

## 2019-03-07 DIAGNOSIS — M9901 Segmental and somatic dysfunction of cervical region: Secondary | ICD-10-CM | POA: Diagnosis not present

## 2019-03-07 DIAGNOSIS — G441 Vascular headache, not elsewhere classified: Secondary | ICD-10-CM | POA: Diagnosis not present

## 2019-03-09 DIAGNOSIS — L97413 Non-pressure chronic ulcer of right heel and midfoot with necrosis of muscle: Secondary | ICD-10-CM | POA: Diagnosis not present

## 2019-03-12 DIAGNOSIS — E1121 Type 2 diabetes mellitus with diabetic nephropathy: Secondary | ICD-10-CM | POA: Diagnosis not present

## 2019-03-12 DIAGNOSIS — I10 Essential (primary) hypertension: Secondary | ICD-10-CM | POA: Diagnosis not present

## 2019-03-12 DIAGNOSIS — Z794 Long term (current) use of insulin: Secondary | ICD-10-CM | POA: Diagnosis not present

## 2019-03-12 DIAGNOSIS — N1831 Chronic kidney disease, stage 3a: Secondary | ICD-10-CM | POA: Diagnosis not present

## 2019-03-12 DIAGNOSIS — D649 Anemia, unspecified: Secondary | ICD-10-CM | POA: Diagnosis not present

## 2019-03-13 DIAGNOSIS — Z85820 Personal history of malignant melanoma of skin: Secondary | ICD-10-CM | POA: Diagnosis not present

## 2019-03-13 DIAGNOSIS — M9901 Segmental and somatic dysfunction of cervical region: Secondary | ICD-10-CM | POA: Diagnosis not present

## 2019-03-13 DIAGNOSIS — M546 Pain in thoracic spine: Secondary | ICD-10-CM | POA: Diagnosis not present

## 2019-03-13 DIAGNOSIS — L219 Seborrheic dermatitis, unspecified: Secondary | ICD-10-CM | POA: Diagnosis not present

## 2019-03-13 DIAGNOSIS — Z85828 Personal history of other malignant neoplasm of skin: Secondary | ICD-10-CM | POA: Diagnosis not present

## 2019-03-13 DIAGNOSIS — M9903 Segmental and somatic dysfunction of lumbar region: Secondary | ICD-10-CM | POA: Diagnosis not present

## 2019-03-13 DIAGNOSIS — M9902 Segmental and somatic dysfunction of thoracic region: Secondary | ICD-10-CM | POA: Diagnosis not present

## 2019-03-13 DIAGNOSIS — M47816 Spondylosis without myelopathy or radiculopathy, lumbar region: Secondary | ICD-10-CM | POA: Diagnosis not present

## 2019-03-13 DIAGNOSIS — G441 Vascular headache, not elsewhere classified: Secondary | ICD-10-CM | POA: Diagnosis not present

## 2019-03-13 DIAGNOSIS — L570 Actinic keratosis: Secondary | ICD-10-CM | POA: Diagnosis not present

## 2019-03-13 DIAGNOSIS — M47812 Spondylosis without myelopathy or radiculopathy, cervical region: Secondary | ICD-10-CM | POA: Diagnosis not present

## 2019-03-16 DIAGNOSIS — I10 Essential (primary) hypertension: Secondary | ICD-10-CM | POA: Diagnosis not present

## 2019-03-16 DIAGNOSIS — I739 Peripheral vascular disease, unspecified: Secondary | ICD-10-CM | POA: Diagnosis not present

## 2019-03-16 DIAGNOSIS — L97413 Non-pressure chronic ulcer of right heel and midfoot with necrosis of muscle: Secondary | ICD-10-CM | POA: Diagnosis not present

## 2019-03-16 DIAGNOSIS — Z794 Long term (current) use of insulin: Secondary | ICD-10-CM | POA: Diagnosis not present

## 2019-03-16 DIAGNOSIS — N183 Chronic kidney disease, stage 3 unspecified: Secondary | ICD-10-CM | POA: Diagnosis not present

## 2019-03-16 DIAGNOSIS — E1122 Type 2 diabetes mellitus with diabetic chronic kidney disease: Secondary | ICD-10-CM | POA: Diagnosis not present

## 2019-03-16 DIAGNOSIS — Z89431 Acquired absence of right foot: Secondary | ICD-10-CM | POA: Diagnosis not present

## 2019-03-19 DIAGNOSIS — M9902 Segmental and somatic dysfunction of thoracic region: Secondary | ICD-10-CM | POA: Diagnosis not present

## 2019-03-19 DIAGNOSIS — M9903 Segmental and somatic dysfunction of lumbar region: Secondary | ICD-10-CM | POA: Diagnosis not present

## 2019-03-19 DIAGNOSIS — M47816 Spondylosis without myelopathy or radiculopathy, lumbar region: Secondary | ICD-10-CM | POA: Diagnosis not present

## 2019-03-19 DIAGNOSIS — G441 Vascular headache, not elsewhere classified: Secondary | ICD-10-CM | POA: Diagnosis not present

## 2019-03-19 DIAGNOSIS — M9901 Segmental and somatic dysfunction of cervical region: Secondary | ICD-10-CM | POA: Diagnosis not present

## 2019-03-19 DIAGNOSIS — M546 Pain in thoracic spine: Secondary | ICD-10-CM | POA: Diagnosis not present

## 2019-03-19 DIAGNOSIS — M47812 Spondylosis without myelopathy or radiculopathy, cervical region: Secondary | ICD-10-CM | POA: Diagnosis not present

## 2019-03-22 DIAGNOSIS — M47812 Spondylosis without myelopathy or radiculopathy, cervical region: Secondary | ICD-10-CM | POA: Diagnosis not present

## 2019-03-22 DIAGNOSIS — M9901 Segmental and somatic dysfunction of cervical region: Secondary | ICD-10-CM | POA: Diagnosis not present

## 2019-03-22 DIAGNOSIS — M47816 Spondylosis without myelopathy or radiculopathy, lumbar region: Secondary | ICD-10-CM | POA: Diagnosis not present

## 2019-03-22 DIAGNOSIS — M9903 Segmental and somatic dysfunction of lumbar region: Secondary | ICD-10-CM | POA: Diagnosis not present

## 2019-03-22 DIAGNOSIS — M9902 Segmental and somatic dysfunction of thoracic region: Secondary | ICD-10-CM | POA: Diagnosis not present

## 2019-03-22 DIAGNOSIS — M546 Pain in thoracic spine: Secondary | ICD-10-CM | POA: Diagnosis not present

## 2019-03-22 DIAGNOSIS — G441 Vascular headache, not elsewhere classified: Secondary | ICD-10-CM | POA: Diagnosis not present

## 2019-03-23 DIAGNOSIS — L97413 Non-pressure chronic ulcer of right heel and midfoot with necrosis of muscle: Secondary | ICD-10-CM | POA: Diagnosis not present

## 2019-03-26 DIAGNOSIS — M9903 Segmental and somatic dysfunction of lumbar region: Secondary | ICD-10-CM | POA: Diagnosis not present

## 2019-03-26 DIAGNOSIS — G441 Vascular headache, not elsewhere classified: Secondary | ICD-10-CM | POA: Diagnosis not present

## 2019-03-26 DIAGNOSIS — M9902 Segmental and somatic dysfunction of thoracic region: Secondary | ICD-10-CM | POA: Diagnosis not present

## 2019-03-26 DIAGNOSIS — M47812 Spondylosis without myelopathy or radiculopathy, cervical region: Secondary | ICD-10-CM | POA: Diagnosis not present

## 2019-03-26 DIAGNOSIS — M546 Pain in thoracic spine: Secondary | ICD-10-CM | POA: Diagnosis not present

## 2019-03-26 DIAGNOSIS — M47816 Spondylosis without myelopathy or radiculopathy, lumbar region: Secondary | ICD-10-CM | POA: Diagnosis not present

## 2019-03-26 DIAGNOSIS — M9901 Segmental and somatic dysfunction of cervical region: Secondary | ICD-10-CM | POA: Diagnosis not present

## 2019-04-02 DIAGNOSIS — M9903 Segmental and somatic dysfunction of lumbar region: Secondary | ICD-10-CM | POA: Diagnosis not present

## 2019-04-02 DIAGNOSIS — M546 Pain in thoracic spine: Secondary | ICD-10-CM | POA: Diagnosis not present

## 2019-04-02 DIAGNOSIS — M9901 Segmental and somatic dysfunction of cervical region: Secondary | ICD-10-CM | POA: Diagnosis not present

## 2019-04-02 DIAGNOSIS — G441 Vascular headache, not elsewhere classified: Secondary | ICD-10-CM | POA: Diagnosis not present

## 2019-04-02 DIAGNOSIS — M9902 Segmental and somatic dysfunction of thoracic region: Secondary | ICD-10-CM | POA: Diagnosis not present

## 2019-04-02 DIAGNOSIS — M47812 Spondylosis without myelopathy or radiculopathy, cervical region: Secondary | ICD-10-CM | POA: Diagnosis not present

## 2019-04-02 DIAGNOSIS — M47816 Spondylosis without myelopathy or radiculopathy, lumbar region: Secondary | ICD-10-CM | POA: Diagnosis not present

## 2019-04-09 DIAGNOSIS — M9901 Segmental and somatic dysfunction of cervical region: Secondary | ICD-10-CM | POA: Diagnosis not present

## 2019-04-09 DIAGNOSIS — M9903 Segmental and somatic dysfunction of lumbar region: Secondary | ICD-10-CM | POA: Diagnosis not present

## 2019-04-09 DIAGNOSIS — G441 Vascular headache, not elsewhere classified: Secondary | ICD-10-CM | POA: Diagnosis not present

## 2019-04-09 DIAGNOSIS — M546 Pain in thoracic spine: Secondary | ICD-10-CM | POA: Diagnosis not present

## 2019-04-09 DIAGNOSIS — M47816 Spondylosis without myelopathy or radiculopathy, lumbar region: Secondary | ICD-10-CM | POA: Diagnosis not present

## 2019-04-09 DIAGNOSIS — M47812 Spondylosis without myelopathy or radiculopathy, cervical region: Secondary | ICD-10-CM | POA: Diagnosis not present

## 2019-04-09 DIAGNOSIS — M9902 Segmental and somatic dysfunction of thoracic region: Secondary | ICD-10-CM | POA: Diagnosis not present

## 2019-04-12 DIAGNOSIS — I10 Essential (primary) hypertension: Secondary | ICD-10-CM | POA: Diagnosis not present

## 2019-04-12 DIAGNOSIS — M546 Pain in thoracic spine: Secondary | ICD-10-CM | POA: Diagnosis not present

## 2019-04-12 DIAGNOSIS — M9902 Segmental and somatic dysfunction of thoracic region: Secondary | ICD-10-CM | POA: Diagnosis not present

## 2019-04-12 DIAGNOSIS — M9903 Segmental and somatic dysfunction of lumbar region: Secondary | ICD-10-CM | POA: Diagnosis not present

## 2019-04-12 DIAGNOSIS — G441 Vascular headache, not elsewhere classified: Secondary | ICD-10-CM | POA: Diagnosis not present

## 2019-04-12 DIAGNOSIS — E782 Mixed hyperlipidemia: Secondary | ICD-10-CM | POA: Diagnosis not present

## 2019-04-12 DIAGNOSIS — M47812 Spondylosis without myelopathy or radiculopathy, cervical region: Secondary | ICD-10-CM | POA: Diagnosis not present

## 2019-04-12 DIAGNOSIS — M47816 Spondylosis without myelopathy or radiculopathy, lumbar region: Secondary | ICD-10-CM | POA: Diagnosis not present

## 2019-04-12 DIAGNOSIS — M9901 Segmental and somatic dysfunction of cervical region: Secondary | ICD-10-CM | POA: Diagnosis not present

## 2019-04-18 DIAGNOSIS — I739 Peripheral vascular disease, unspecified: Secondary | ICD-10-CM | POA: Diagnosis not present

## 2019-04-18 DIAGNOSIS — Z89431 Acquired absence of right foot: Secondary | ICD-10-CM | POA: Diagnosis not present

## 2019-04-18 DIAGNOSIS — L97413 Non-pressure chronic ulcer of right heel and midfoot with necrosis of muscle: Secondary | ICD-10-CM | POA: Diagnosis not present

## 2019-04-18 DIAGNOSIS — N183 Chronic kidney disease, stage 3 unspecified: Secondary | ICD-10-CM | POA: Diagnosis not present

## 2019-04-19 DIAGNOSIS — M47816 Spondylosis without myelopathy or radiculopathy, lumbar region: Secondary | ICD-10-CM | POA: Diagnosis not present

## 2019-04-19 DIAGNOSIS — M9902 Segmental and somatic dysfunction of thoracic region: Secondary | ICD-10-CM | POA: Diagnosis not present

## 2019-04-19 DIAGNOSIS — M9901 Segmental and somatic dysfunction of cervical region: Secondary | ICD-10-CM | POA: Diagnosis not present

## 2019-04-19 DIAGNOSIS — M47812 Spondylosis without myelopathy or radiculopathy, cervical region: Secondary | ICD-10-CM | POA: Diagnosis not present

## 2019-04-19 DIAGNOSIS — M9903 Segmental and somatic dysfunction of lumbar region: Secondary | ICD-10-CM | POA: Diagnosis not present

## 2019-04-19 DIAGNOSIS — M546 Pain in thoracic spine: Secondary | ICD-10-CM | POA: Diagnosis not present

## 2019-04-19 DIAGNOSIS — G441 Vascular headache, not elsewhere classified: Secondary | ICD-10-CM | POA: Diagnosis not present

## 2019-04-25 DIAGNOSIS — E782 Mixed hyperlipidemia: Secondary | ICD-10-CM | POA: Diagnosis not present

## 2019-04-26 DIAGNOSIS — L97413 Non-pressure chronic ulcer of right heel and midfoot with necrosis of muscle: Secondary | ICD-10-CM | POA: Diagnosis not present

## 2019-04-27 DIAGNOSIS — M47816 Spondylosis without myelopathy or radiculopathy, lumbar region: Secondary | ICD-10-CM | POA: Diagnosis not present

## 2019-04-27 DIAGNOSIS — M546 Pain in thoracic spine: Secondary | ICD-10-CM | POA: Diagnosis not present

## 2019-04-27 DIAGNOSIS — M9902 Segmental and somatic dysfunction of thoracic region: Secondary | ICD-10-CM | POA: Diagnosis not present

## 2019-04-27 DIAGNOSIS — M47812 Spondylosis without myelopathy or radiculopathy, cervical region: Secondary | ICD-10-CM | POA: Diagnosis not present

## 2019-04-27 DIAGNOSIS — M9903 Segmental and somatic dysfunction of lumbar region: Secondary | ICD-10-CM | POA: Diagnosis not present

## 2019-04-27 DIAGNOSIS — G441 Vascular headache, not elsewhere classified: Secondary | ICD-10-CM | POA: Diagnosis not present

## 2019-04-27 DIAGNOSIS — M9901 Segmental and somatic dysfunction of cervical region: Secondary | ICD-10-CM | POA: Diagnosis not present

## 2019-05-01 DIAGNOSIS — M9901 Segmental and somatic dysfunction of cervical region: Secondary | ICD-10-CM | POA: Diagnosis not present

## 2019-05-01 DIAGNOSIS — G441 Vascular headache, not elsewhere classified: Secondary | ICD-10-CM | POA: Diagnosis not present

## 2019-05-01 DIAGNOSIS — M9903 Segmental and somatic dysfunction of lumbar region: Secondary | ICD-10-CM | POA: Diagnosis not present

## 2019-05-01 DIAGNOSIS — M546 Pain in thoracic spine: Secondary | ICD-10-CM | POA: Diagnosis not present

## 2019-05-01 DIAGNOSIS — M9902 Segmental and somatic dysfunction of thoracic region: Secondary | ICD-10-CM | POA: Diagnosis not present

## 2019-05-01 DIAGNOSIS — M47812 Spondylosis without myelopathy or radiculopathy, cervical region: Secondary | ICD-10-CM | POA: Diagnosis not present

## 2019-05-01 DIAGNOSIS — M47816 Spondylosis without myelopathy or radiculopathy, lumbar region: Secondary | ICD-10-CM | POA: Diagnosis not present

## 2019-05-04 DIAGNOSIS — N183 Chronic kidney disease, stage 3 unspecified: Secondary | ICD-10-CM | POA: Diagnosis not present

## 2019-05-04 DIAGNOSIS — L97413 Non-pressure chronic ulcer of right heel and midfoot with necrosis of muscle: Secondary | ICD-10-CM | POA: Diagnosis not present

## 2019-05-04 DIAGNOSIS — Z89431 Acquired absence of right foot: Secondary | ICD-10-CM | POA: Diagnosis not present

## 2019-05-04 DIAGNOSIS — I739 Peripheral vascular disease, unspecified: Secondary | ICD-10-CM | POA: Diagnosis not present

## 2019-05-08 DIAGNOSIS — M47812 Spondylosis without myelopathy or radiculopathy, cervical region: Secondary | ICD-10-CM | POA: Diagnosis not present

## 2019-05-08 DIAGNOSIS — M9901 Segmental and somatic dysfunction of cervical region: Secondary | ICD-10-CM | POA: Diagnosis not present

## 2019-05-08 DIAGNOSIS — M9903 Segmental and somatic dysfunction of lumbar region: Secondary | ICD-10-CM | POA: Diagnosis not present

## 2019-05-08 DIAGNOSIS — M546 Pain in thoracic spine: Secondary | ICD-10-CM | POA: Diagnosis not present

## 2019-05-08 DIAGNOSIS — M47816 Spondylosis without myelopathy or radiculopathy, lumbar region: Secondary | ICD-10-CM | POA: Diagnosis not present

## 2019-05-08 DIAGNOSIS — G441 Vascular headache, not elsewhere classified: Secondary | ICD-10-CM | POA: Diagnosis not present

## 2019-05-08 DIAGNOSIS — M9902 Segmental and somatic dysfunction of thoracic region: Secondary | ICD-10-CM | POA: Diagnosis not present

## 2019-05-09 DIAGNOSIS — E1140 Type 2 diabetes mellitus with diabetic neuropathy, unspecified: Secondary | ICD-10-CM | POA: Diagnosis not present

## 2019-05-09 DIAGNOSIS — L110 Acquired keratosis follicularis: Secondary | ICD-10-CM | POA: Diagnosis not present

## 2019-05-09 DIAGNOSIS — M79672 Pain in left foot: Secondary | ICD-10-CM | POA: Diagnosis not present

## 2019-05-09 DIAGNOSIS — M79671 Pain in right foot: Secondary | ICD-10-CM | POA: Diagnosis not present

## 2019-05-09 DIAGNOSIS — I739 Peripheral vascular disease, unspecified: Secondary | ICD-10-CM | POA: Diagnosis not present

## 2019-05-11 DIAGNOSIS — I2510 Atherosclerotic heart disease of native coronary artery without angina pectoris: Secondary | ICD-10-CM | POA: Diagnosis not present

## 2019-05-11 DIAGNOSIS — Z951 Presence of aortocoronary bypass graft: Secondary | ICD-10-CM | POA: Diagnosis not present

## 2019-05-11 DIAGNOSIS — I350 Nonrheumatic aortic (valve) stenosis: Secondary | ICD-10-CM | POA: Diagnosis not present

## 2019-05-11 DIAGNOSIS — Z952 Presence of prosthetic heart valve: Secondary | ICD-10-CM | POA: Diagnosis not present

## 2019-05-11 DIAGNOSIS — E11621 Type 2 diabetes mellitus with foot ulcer: Secondary | ICD-10-CM | POA: Diagnosis not present

## 2019-05-11 DIAGNOSIS — I10 Essential (primary) hypertension: Secondary | ICD-10-CM | POA: Diagnosis not present

## 2019-05-11 DIAGNOSIS — L97519 Non-pressure chronic ulcer of other part of right foot with unspecified severity: Secondary | ICD-10-CM | POA: Diagnosis not present

## 2019-05-15 DIAGNOSIS — M9901 Segmental and somatic dysfunction of cervical region: Secondary | ICD-10-CM | POA: Diagnosis not present

## 2019-05-15 DIAGNOSIS — M9903 Segmental and somatic dysfunction of lumbar region: Secondary | ICD-10-CM | POA: Diagnosis not present

## 2019-05-15 DIAGNOSIS — M47812 Spondylosis without myelopathy or radiculopathy, cervical region: Secondary | ICD-10-CM | POA: Diagnosis not present

## 2019-05-15 DIAGNOSIS — M546 Pain in thoracic spine: Secondary | ICD-10-CM | POA: Diagnosis not present

## 2019-05-15 DIAGNOSIS — M9902 Segmental and somatic dysfunction of thoracic region: Secondary | ICD-10-CM | POA: Diagnosis not present

## 2019-05-15 DIAGNOSIS — G441 Vascular headache, not elsewhere classified: Secondary | ICD-10-CM | POA: Diagnosis not present

## 2019-05-15 DIAGNOSIS — M47816 Spondylosis without myelopathy or radiculopathy, lumbar region: Secondary | ICD-10-CM | POA: Diagnosis not present

## 2019-05-18 DIAGNOSIS — E08621 Diabetes mellitus due to underlying condition with foot ulcer: Secondary | ICD-10-CM | POA: Diagnosis not present

## 2019-05-18 DIAGNOSIS — L97519 Non-pressure chronic ulcer of other part of right foot with unspecified severity: Secondary | ICD-10-CM | POA: Diagnosis not present

## 2019-05-18 DIAGNOSIS — Z89431 Acquired absence of right foot: Secondary | ICD-10-CM | POA: Diagnosis not present

## 2019-05-18 DIAGNOSIS — E11621 Type 2 diabetes mellitus with foot ulcer: Secondary | ICD-10-CM | POA: Diagnosis not present

## 2019-05-18 DIAGNOSIS — I739 Peripheral vascular disease, unspecified: Secondary | ICD-10-CM | POA: Diagnosis not present

## 2019-05-18 DIAGNOSIS — L97411 Non-pressure chronic ulcer of right heel and midfoot limited to breakdown of skin: Secondary | ICD-10-CM | POA: Diagnosis not present

## 2019-05-18 DIAGNOSIS — L89522 Pressure ulcer of left ankle, stage 2: Secondary | ICD-10-CM | POA: Diagnosis not present

## 2019-05-18 DIAGNOSIS — I10 Essential (primary) hypertension: Secondary | ICD-10-CM | POA: Diagnosis not present

## 2019-05-18 DIAGNOSIS — N183 Chronic kidney disease, stage 3 unspecified: Secondary | ICD-10-CM | POA: Diagnosis not present

## 2019-05-21 DIAGNOSIS — A084 Viral intestinal infection, unspecified: Secondary | ICD-10-CM | POA: Diagnosis not present

## 2019-05-21 DIAGNOSIS — Z888 Allergy status to other drugs, medicaments and biological substances status: Secondary | ICD-10-CM | POA: Diagnosis not present

## 2019-05-21 DIAGNOSIS — F329 Major depressive disorder, single episode, unspecified: Secondary | ICD-10-CM | POA: Diagnosis not present

## 2019-05-21 DIAGNOSIS — E1140 Type 2 diabetes mellitus with diabetic neuropathy, unspecified: Secondary | ICD-10-CM | POA: Diagnosis not present

## 2019-05-21 DIAGNOSIS — E860 Dehydration: Secondary | ICD-10-CM | POA: Diagnosis not present

## 2019-05-21 DIAGNOSIS — E876 Hypokalemia: Secondary | ICD-10-CM | POA: Diagnosis not present

## 2019-05-21 DIAGNOSIS — M1990 Unspecified osteoarthritis, unspecified site: Secondary | ICD-10-CM | POA: Diagnosis not present

## 2019-05-21 DIAGNOSIS — Z8249 Family history of ischemic heart disease and other diseases of the circulatory system: Secondary | ICD-10-CM | POA: Diagnosis not present

## 2019-05-21 DIAGNOSIS — E1165 Type 2 diabetes mellitus with hyperglycemia: Secondary | ICD-10-CM | POA: Diagnosis not present

## 2019-05-21 DIAGNOSIS — R112 Nausea with vomiting, unspecified: Secondary | ICD-10-CM | POA: Diagnosis not present

## 2019-05-21 DIAGNOSIS — Z20828 Contact with and (suspected) exposure to other viral communicable diseases: Secondary | ICD-10-CM | POA: Diagnosis not present

## 2019-05-21 DIAGNOSIS — R1110 Vomiting, unspecified: Secondary | ICD-10-CM | POA: Diagnosis not present

## 2019-05-21 DIAGNOSIS — Z833 Family history of diabetes mellitus: Secondary | ICD-10-CM | POA: Diagnosis not present

## 2019-05-21 DIAGNOSIS — Z7982 Long term (current) use of aspirin: Secondary | ICD-10-CM | POA: Diagnosis not present

## 2019-05-21 DIAGNOSIS — Z85828 Personal history of other malignant neoplasm of skin: Secondary | ICD-10-CM | POA: Diagnosis not present

## 2019-05-21 DIAGNOSIS — E039 Hypothyroidism, unspecified: Secondary | ICD-10-CM | POA: Diagnosis not present

## 2019-05-21 DIAGNOSIS — K8020 Calculus of gallbladder without cholecystitis without obstruction: Secondary | ICD-10-CM | POA: Diagnosis not present

## 2019-05-21 DIAGNOSIS — Z794 Long term (current) use of insulin: Secondary | ICD-10-CM | POA: Diagnosis not present

## 2019-05-21 DIAGNOSIS — R824 Acetonuria: Secondary | ICD-10-CM | POA: Diagnosis not present

## 2019-05-21 DIAGNOSIS — K449 Diaphragmatic hernia without obstruction or gangrene: Secondary | ICD-10-CM | POA: Diagnosis not present

## 2019-05-21 DIAGNOSIS — R066 Hiccough: Secondary | ICD-10-CM | POA: Diagnosis not present

## 2019-05-21 DIAGNOSIS — N184 Chronic kidney disease, stage 4 (severe): Secondary | ICD-10-CM | POA: Diagnosis not present

## 2019-05-21 DIAGNOSIS — Z79899 Other long term (current) drug therapy: Secondary | ICD-10-CM | POA: Diagnosis not present

## 2019-05-21 DIAGNOSIS — I700 Atherosclerosis of aorta: Secondary | ICD-10-CM | POA: Diagnosis not present

## 2019-05-21 DIAGNOSIS — K2100 Gastro-esophageal reflux disease with esophagitis, without bleeding: Secondary | ICD-10-CM | POA: Diagnosis not present

## 2019-05-22 ENCOUNTER — Encounter (HOSPITAL_COMMUNITY): Payer: Self-pay | Admitting: Emergency Medicine

## 2019-05-22 ENCOUNTER — Inpatient Hospital Stay (HOSPITAL_COMMUNITY)
Admission: EM | Admit: 2019-05-22 | Discharge: 2019-05-30 | DRG: 871 | Disposition: A | Discharge status: Home Health | Payer: Medicare Other | Attending: Internal Medicine | Admitting: Internal Medicine

## 2019-05-22 ENCOUNTER — Other Ambulatory Visit: Payer: Self-pay

## 2019-05-22 ENCOUNTER — Emergency Department (HOSPITAL_COMMUNITY): Payer: Medicare Other

## 2019-05-22 DIAGNOSIS — I10 Essential (primary) hypertension: Secondary | ICD-10-CM | POA: Diagnosis present

## 2019-05-22 DIAGNOSIS — E785 Hyperlipidemia, unspecified: Secondary | ICD-10-CM | POA: Diagnosis present

## 2019-05-22 DIAGNOSIS — Z66 Do not resuscitate: Secondary | ICD-10-CM | POA: Diagnosis present

## 2019-05-22 DIAGNOSIS — Z953 Presence of xenogenic heart valve: Secondary | ICD-10-CM

## 2019-05-22 DIAGNOSIS — E1110 Type 2 diabetes mellitus with ketoacidosis without coma: Secondary | ICD-10-CM | POA: Diagnosis present

## 2019-05-22 DIAGNOSIS — Z9049 Acquired absence of other specified parts of digestive tract: Secondary | ICD-10-CM

## 2019-05-22 DIAGNOSIS — E039 Hypothyroidism, unspecified: Secondary | ICD-10-CM | POA: Diagnosis present

## 2019-05-22 DIAGNOSIS — R509 Fever, unspecified: Secondary | ICD-10-CM | POA: Diagnosis not present

## 2019-05-22 DIAGNOSIS — A4101 Sepsis due to Methicillin susceptible Staphylococcus aureus: Secondary | ICD-10-CM | POA: Diagnosis not present

## 2019-05-22 DIAGNOSIS — B9561 Methicillin susceptible Staphylococcus aureus infection as the cause of diseases classified elsewhere: Secondary | ICD-10-CM | POA: Diagnosis present

## 2019-05-22 DIAGNOSIS — L02611 Cutaneous abscess of right foot: Secondary | ICD-10-CM | POA: Diagnosis present

## 2019-05-22 DIAGNOSIS — Z823 Family history of stroke: Secondary | ICD-10-CM

## 2019-05-22 DIAGNOSIS — N179 Acute kidney failure, unspecified: Secondary | ICD-10-CM

## 2019-05-22 DIAGNOSIS — Z794 Long term (current) use of insulin: Secondary | ICD-10-CM

## 2019-05-22 DIAGNOSIS — Z7982 Long term (current) use of aspirin: Secondary | ICD-10-CM

## 2019-05-22 DIAGNOSIS — D631 Anemia in chronic kidney disease: Secondary | ICD-10-CM | POA: Diagnosis present

## 2019-05-22 DIAGNOSIS — Z833 Family history of diabetes mellitus: Secondary | ICD-10-CM

## 2019-05-22 DIAGNOSIS — L97519 Non-pressure chronic ulcer of other part of right foot with unspecified severity: Secondary | ICD-10-CM | POA: Diagnosis present

## 2019-05-22 DIAGNOSIS — Z7902 Long term (current) use of antithrombotics/antiplatelets: Secondary | ICD-10-CM

## 2019-05-22 DIAGNOSIS — A419 Sepsis, unspecified organism: Secondary | ICD-10-CM | POA: Diagnosis present

## 2019-05-22 DIAGNOSIS — I129 Hypertensive chronic kidney disease with stage 1 through stage 4 chronic kidney disease, or unspecified chronic kidney disease: Secondary | ICD-10-CM | POA: Diagnosis present

## 2019-05-22 DIAGNOSIS — E11628 Type 2 diabetes mellitus with other skin complications: Secondary | ICD-10-CM | POA: Diagnosis present

## 2019-05-22 DIAGNOSIS — R7881 Bacteremia: Secondary | ICD-10-CM

## 2019-05-22 DIAGNOSIS — E1151 Type 2 diabetes mellitus with diabetic peripheral angiopathy without gangrene: Secondary | ICD-10-CM | POA: Diagnosis present

## 2019-05-22 DIAGNOSIS — B952 Enterococcus as the cause of diseases classified elsewhere: Secondary | ICD-10-CM | POA: Diagnosis present

## 2019-05-22 DIAGNOSIS — Z79899 Other long term (current) drug therapy: Secondary | ICD-10-CM

## 2019-05-22 DIAGNOSIS — G4733 Obstructive sleep apnea (adult) (pediatric): Secondary | ICD-10-CM | POA: Diagnosis present

## 2019-05-22 DIAGNOSIS — Z8249 Family history of ischemic heart disease and other diseases of the circulatory system: Secondary | ICD-10-CM

## 2019-05-22 DIAGNOSIS — D696 Thrombocytopenia, unspecified: Secondary | ICD-10-CM | POA: Diagnosis present

## 2019-05-22 DIAGNOSIS — Z20822 Contact with and (suspected) exposure to covid-19: Secondary | ICD-10-CM | POA: Diagnosis not present

## 2019-05-22 DIAGNOSIS — J810 Acute pulmonary edema: Secondary | ICD-10-CM | POA: Diagnosis not present

## 2019-05-22 DIAGNOSIS — N1831 Chronic kidney disease, stage 3a: Secondary | ICD-10-CM | POA: Diagnosis present

## 2019-05-22 DIAGNOSIS — J9601 Acute respiratory failure with hypoxia: Secondary | ICD-10-CM | POA: Diagnosis not present

## 2019-05-22 DIAGNOSIS — L97329 Non-pressure chronic ulcer of left ankle with unspecified severity: Secondary | ICD-10-CM | POA: Diagnosis present

## 2019-05-22 DIAGNOSIS — Z952 Presence of prosthetic heart valve: Secondary | ICD-10-CM

## 2019-05-22 DIAGNOSIS — Z79890 Hormone replacement therapy (postmenopausal): Secondary | ICD-10-CM

## 2019-05-22 DIAGNOSIS — R739 Hyperglycemia, unspecified: Secondary | ICD-10-CM

## 2019-05-22 DIAGNOSIS — R6520 Severe sepsis without septic shock: Secondary | ICD-10-CM | POA: Diagnosis not present

## 2019-05-22 DIAGNOSIS — I2510 Atherosclerotic heart disease of native coronary artery without angina pectoris: Secondary | ICD-10-CM | POA: Diagnosis present

## 2019-05-22 DIAGNOSIS — E876 Hypokalemia: Secondary | ICD-10-CM | POA: Diagnosis not present

## 2019-05-22 DIAGNOSIS — Z89431 Acquired absence of right foot: Secondary | ICD-10-CM

## 2019-05-22 DIAGNOSIS — R32 Unspecified urinary incontinence: Secondary | ICD-10-CM | POA: Diagnosis present

## 2019-05-22 DIAGNOSIS — D649 Anemia, unspecified: Secondary | ICD-10-CM | POA: Diagnosis present

## 2019-05-22 DIAGNOSIS — F419 Anxiety disorder, unspecified: Secondary | ICD-10-CM | POA: Diagnosis not present

## 2019-05-22 DIAGNOSIS — Z85828 Personal history of other malignant neoplasm of skin: Secondary | ICD-10-CM

## 2019-05-22 DIAGNOSIS — E1122 Type 2 diabetes mellitus with diabetic chronic kidney disease: Secondary | ICD-10-CM | POA: Diagnosis present

## 2019-05-22 DIAGNOSIS — E11621 Type 2 diabetes mellitus with foot ulcer: Secondary | ICD-10-CM | POA: Diagnosis present

## 2019-05-22 DIAGNOSIS — I739 Peripheral vascular disease, unspecified: Secondary | ICD-10-CM | POA: Diagnosis present

## 2019-05-22 DIAGNOSIS — E1165 Type 2 diabetes mellitus with hyperglycemia: Secondary | ICD-10-CM | POA: Diagnosis not present

## 2019-05-22 DIAGNOSIS — R0902 Hypoxemia: Secondary | ICD-10-CM

## 2019-05-22 DIAGNOSIS — E119 Type 2 diabetes mellitus without complications: Secondary | ICD-10-CM

## 2019-05-22 DIAGNOSIS — A4181 Sepsis due to Enterococcus: Secondary | ICD-10-CM | POA: Diagnosis present

## 2019-05-22 DIAGNOSIS — N183 Chronic kidney disease, stage 3 unspecified: Secondary | ICD-10-CM | POA: Diagnosis present

## 2019-05-22 DIAGNOSIS — Z951 Presence of aortocoronary bypass graft: Secondary | ICD-10-CM

## 2019-05-22 LAB — COMPREHENSIVE METABOLIC PANEL
ALT: 19 U/L (ref 0–44)
AST: 23 U/L (ref 15–41)
Albumin: 3.7 g/dL (ref 3.5–5.0)
Alkaline Phosphatase: 53 U/L (ref 38–126)
Anion gap: >20 — ABNORMAL HIGH (ref 5–15)
BUN: 47 mg/dL — ABNORMAL HIGH (ref 8–23)
CO2: 20 mmol/L — ABNORMAL LOW (ref 22–32)
Calcium: 8.9 mg/dL (ref 8.9–10.3)
Chloride: 91 mmol/L — ABNORMAL LOW (ref 98–111)
Creatinine, Ser: 1.67 mg/dL — ABNORMAL HIGH (ref 0.61–1.24)
GFR calc Af Amer: 47 mL/min — ABNORMAL LOW (ref >60)
GFR calc non Af Amer: 40 mL/min — ABNORMAL LOW (ref >60)
Glucose, Bld: 371 mg/dL — ABNORMAL HIGH (ref 70–99)
Potassium: 3.5 mmol/L (ref 3.5–5.1)
Sodium: 132 mmol/L — ABNORMAL LOW (ref 135–145)
Total Bilirubin: 1.7 mg/dL — ABNORMAL HIGH (ref 0.3–1.2)
Total Protein: 7.7 g/dL (ref 6.5–8.1)

## 2019-05-22 LAB — CBC
HCT: 40.7 % (ref 39.0–52.0)
Hemoglobin: 13.5 g/dL (ref 13.0–17.0)
MCH: 31.4 pg (ref 26.0–34.0)
MCHC: 33.2 g/dL (ref 30.0–36.0)
MCV: 94.7 fL (ref 80.0–100.0)
Platelets: 119 10*3/uL — ABNORMAL LOW (ref 150–400)
RBC: 4.3 MIL/uL (ref 4.22–5.81)
RDW: 13.3 % (ref 11.5–15.5)
WBC: 7.2 10*3/uL (ref 4.0–10.5)
nRBC: 0 % (ref 0.0–0.2)

## 2019-05-22 LAB — URINALYSIS, ROUTINE W REFLEX MICROSCOPIC
Bacteria, UA: NONE SEEN
Bilirubin Urine: NEGATIVE
Glucose, UA: >=500 mg/dL — AB
Ketones, ur: 20 mg/dL — AB
Leukocytes,Ua: NEGATIVE
Nitrite: NEGATIVE
Protein, ur: 30 mg/dL — AB
Specific Gravity, Urine: 1.026 (ref 1.005–1.030)
pH: 5 (ref 5.0–8.0)

## 2019-05-22 LAB — CBG MONITORING, ED: Glucose-Capillary: 357 mg/dL — ABNORMAL HIGH (ref 70–99)

## 2019-05-22 LAB — RESPIRATORY PANEL BY RT PCR (FLU A&B, COVID)
Influenza A by PCR: NEGATIVE
Influenza B by PCR: NEGATIVE
SARS Coronavirus 2 by RT PCR: NEGATIVE

## 2019-05-22 LAB — PROTIME-INR
INR: 1.2 (ref 0.8–1.2)
Prothrombin Time: 14.9 seconds (ref 11.4–15.2)

## 2019-05-22 LAB — LACTIC ACID, PLASMA
Lactic Acid, Venous: 2.4 mmol/L (ref 0.5–1.9)
Lactic Acid, Venous: 2.3 mmol/L (ref 0.5–1.9)

## 2019-05-22 LAB — TROPONIN I (HIGH SENSITIVITY)
Troponin I (High Sensitivity): 61 ng/L — ABNORMAL HIGH (ref <18)
Troponin I (High Sensitivity): 84 ng/L — ABNORMAL HIGH (ref <18)

## 2019-05-22 LAB — APTT: aPTT: 29 seconds (ref 24–36)

## 2019-05-22 MED ORDER — METRONIDAZOLE IN NACL 5-0.79 MG/ML-% IV SOLN
500.0000 mg | Freq: Once | INTRAVENOUS | Status: AC
  Administered 2019-05-22: 500 mg via INTRAVENOUS
  Filled 2019-05-22: qty 100

## 2019-05-22 MED ORDER — ACETAMINOPHEN 650 MG RE SUPP
975.0000 mg | Freq: Once | RECTAL | Status: AC
  Administered 2019-05-22: 975 mg via RECTAL
  Filled 2019-05-22: qty 1

## 2019-05-22 MED ORDER — VANCOMYCIN HCL 2000 MG/400ML IV SOLN
2000.0000 mg | Freq: Once | INTRAVENOUS | Status: AC
  Administered 2019-05-22: 2000 mg via INTRAVENOUS
  Filled 2019-05-22: qty 400

## 2019-05-22 MED ORDER — SODIUM CHLORIDE 0.9 % IV BOLUS
1000.0000 mL | Freq: Once | INTRAVENOUS | Status: AC
  Administered 2019-05-22: 1000 mL via INTRAVENOUS

## 2019-05-22 MED ORDER — VANCOMYCIN HCL IN DEXTROSE 1-5 GM/200ML-% IV SOLN: 1000.0000 mg | Freq: Once | INTRAVENOUS | Status: DC

## 2019-05-22 MED ORDER — SODIUM CHLORIDE 0.9 % IV SOLN
2.0000 g | Freq: Once | INTRAVENOUS | Status: AC
  Administered 2019-05-22: 2 g via INTRAVENOUS
  Filled 2019-05-22: qty 2

## 2019-05-22 MED ORDER — ACETAMINOPHEN 500 MG PO TABS
1000.0000 mg | ORAL_TABLET | Freq: Once | ORAL | Status: DC
  Filled 2019-05-22: qty 2

## 2019-05-22 NOTE — ED Triage Notes (Signed)
Per wife pt has been vomiting since 1am Sunday and has seen pcp and [facility] for the same. Pt is not really talking and urinating on himself.

## 2019-05-22 NOTE — ED Notes (Signed)
Date and time results received: 05/22/19  10:37 PM   Test: Lactic Acid  Critical Value: 2.3  Name of Provider Notified: Nat Christen, MD  Orders Received? Or Actions Taken?: n/a

## 2019-05-22 NOTE — ED Provider Notes (Addendum)
[place] Provider Note   CSN: [id number] Arrival date & time: 05/22/19  1900     History Chief Complaint  Patient presents with  . Weakness    Larry Hart is a 74 y.o. male.  Level 5 caveat for acuity of condition.  Chief complaint vomiting since 0100 Sunday morning.  Now running a fever.  Patient was seen at [facility] yesterday and sent home.  He is incontinent of urine.  Past medical history includes diabetes, hyperlipidemia, hypertension, status post recent foot infection.        Past Medical History:  Diagnosis Date  . Cancer ([place])    skin  . Colon polyp    Status post hemicolectomy  . Diabetes mellitus ([place])   . Dyslipidemia   . Dyspnea on exertion   . Hypertension   . Low serum triglycerides   . Low testosterone   . 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 Peripheral vascular disease ([place])   . Sinus bradycardia   . Thyroid disorder    low    Patient Active Problem List   Diagnosis Date Noted  . Dyspnea on exertion   . Hypertension   . Diabetes mellitus ([place])   . Sinus bradycardia     Past Surgical History:  Procedure Laterality Date  . AMPUTATION     x5  . HEMICOLECTOMY    . OTHER SURGICAL HISTORY     gastroinstestinal surgery       Family History  Problem Relation Age of Onset  . Coronary artery disease Father 58  . Hypertension Mother   . Stroke Mother   . Diabetes Other     Social History   Tobacco Use  . Smoking status: Never Smoker  . Smokeless tobacco: Never Used  Substance Use Topics  . Alcohol use: No    Alcohol/week: 0.0 standard drinks  . Drug use: No    Home Medications Prior to Admission medications   Medication Sig Start Date End Date Taking? Authorizing Provider  amLODipine (NORVASC) 10 MG tablet Take 10 mg by mouth daily. 04/30/19  Yes [provider]  Ascorbic Acid (VITAMIN C) 1000 MG tablet Take 1,000 mg by mouth daily.   Yes [provider]  aspirin 81  MG tablet Take 81 mg by mouth daily.    Yes [provider]  cetirizine (ZYRTEC) 10 MG tablet Take 10 mg by mouth daily as needed for allergies.   Yes [provider]  chlorthalidone (HYGROTON) 25 MG tablet Take 25 mg by mouth daily. 05/03/19  Yes [provider]  Cholecalciferol (VITAMIN D3) 2000 UNITS TABS Take 1 tablet by mouth daily.    Yes [provider]  clopidogrel (PLAVIX) 75 MG tablet Take 75 mg by mouth daily. 04/30/19  Yes [provider]  Coenzyme Q10 100 MG TABS Take 1 tablet by mouth daily.    Yes [provider]  escitalopram (LEXAPRO) 10 MG tablet Take 10 mg by mouth daily.    Yes [provider]  fenofibrate 160 MG tablet Take 160 mg by mouth daily.   Yes [provider]  gabapentin (NEURONTIN) 600 MG tablet Take 1,200 mg by mouth 3 (three) times daily.    Yes [provider]  insulin glargine (LANTUS) 100 UNIT/ML injection Inject 30 Units into the skin 2 (two) times daily.    Yes [provider]  isosorbide mononitrate (IMDUR) 30 MG 24 hr tablet Take 30 mg by mouth  daily. 02/09/19  Yes [provider]  ketoconazole (NIZORAL) 2 % cream Apply 1 application topically daily as needed for irritation.  05/13/14  Yes [provider]  levothyroxine (SYNTHROID, LEVOTHROID) 50 MCG tablet Take 50 mcg by mouth daily before breakfast.   Yes [provider]  losartan (COZAAR) 100 MG tablet Take 100 mg by mouth daily. 04/30/19  Yes [provider]  metoCLOPramide (REGLAN) 10 MG tablet Take 10 mg by mouth every 8 (eight) hours as needed for nausea or vomiting.  05/22/19  Yes [provider]  Multiple Vitamin (MULTIVITAMIN) capsule Take 1 capsule by mouth daily. Adults 50+   Yes [provider]  NITROSTAT 0.4 MG SL tablet Place 0.4 mg under the tongue every 5 (five) minutes as needed for chest pain.  01/06/12  Yes [provider]  NOVOLIN R RELION 100  UNIT/ML injection Inject 20-50 Units into the skin 3 (three) times daily before meals. Per sliding scale as directed per dietician 03/18/19  Yes [provider]  omega-3 acid ethyl esters (LOVAZA) 1 G capsule Take 2 g by mouth 2 (two) times daily.   Yes [provider]  pantoprazole (PROTONIX) 40 MG tablet Take 40 mg by mouth daily. 05/22/19  Yes [provider]  potassium chloride (KLOR-CON) 10 MEQ tablet Take 10 mEq by mouth 2 (two) times daily.   Yes [provider]  potassium chloride SA (KLOR-CON M20) 20 MEQ tablet Take 20 mEq by mouth 2 (two) times daily.  05/09/17  Yes [provider]  pravastatin (PRAVACHOL) 40 MG tablet Take 40 mg by mouth daily. 04/30/19  Yes [provider]    Allergies    Honey bee treatment [bee venom] and Other  Review of Systems   Review of Systems  Unable to perform ROS: Acuity of condition    Physical Exam Updated Vital Signs BP (!) 150/78   Pulse 71   Temp (!) 103.2 F (39.6 C) (Rectal)   Resp 20   Ht 5\' 9"  (1.753 m)   Wt 99 kg   SpO2 97%   BMI 32.24 kg/m   Physical Exam Vitals and nursing note reviewed.  Constitutional:      Appearance: He is well-developed.     Comments: Not answering questions well.  HENT:     Head: Normocephalic and atraumatic.  Eyes:     Conjunctiva/sclera: Conjunctivae normal.  Cardiovascular:     Rate and Rhythm: Normal rate and regular rhythm.  Pulmonary:     Effort: Pulmonary effort is normal.     Breath sounds: Normal breath sounds.  Abdominal:     General: Bowel sounds are normal.     Palpations: Abdomen is soft.  Musculoskeletal:     Cervical back: Neck supple.     Comments: Right foot: Pus draining from central aspect of proximal plantar aspect.  Skin:    Comments: warm  Neurological:     Comments: unable  Psychiatric:     Comments: obtunded     ED Results / Procedures / Treatments   Labs (all labs ordered are listed, but only abnormal results  are displayed) Labs Reviewed  URINALYSIS, ROUTINE W REFLEX MICROSCOPIC - Abnormal; Notable for the following components:      Result Value   Glucose, UA >=500 (*)    Hgb urine dipstick SMALL (*)    Ketones, ur 20 (*)    Protein, ur 30 (*)    All other components within normal limits  CBC -  Abnormal; Notable for the following components:   Platelets 119 (*)    All other components within normal limits  COMPREHENSIVE METABOLIC PANEL - Abnormal; Notable for the following components:   Sodium 132 (*)    Chloride 91 (*)    CO2 20 (*)    Glucose, Bld 371 (*)    BUN 47 (*)    Creatinine, Ser 1.67 (*)    Total Bilirubin 1.7 (*)    GFR calc non Af Amer 40 (*)    GFR calc Af Amer 47 (*)    Anion gap >20 (*)    All other components within normal limits  LACTIC ACID, PLASMA - Abnormal; Notable for the following components:   Lactic Acid, Venous 2.4 (*)    All other components within normal limits  LACTIC ACID, PLASMA - Abnormal; Notable for the following components:   Lactic Acid, Venous 2.3 (*)    All other components within normal limits  CBG MONITORING, ED - Abnormal; Notable for the following components:   Glucose-Capillary 357 (*)    All other components within normal limits  TROPONIN I (HIGH SENSITIVITY) - Abnormal; Notable for the following components:   Troponin I (High Sensitivity) 61 (*)    All other components within normal limits  CULTURE, BLOOD (ROUTINE X 2)  CULTURE, BLOOD (ROUTINE X 2)  URINE CULTURE  RESPIRATORY PANEL BY RT PCR (FLU A&B, COVID)  APTT  PROTIME-INR  TROPONIN I (HIGH SENSITIVITY)    EKG EKG Interpretation  Date/Time:  Tuesday May 22 2019 19:31:14 EST Ventricular Rate:  69 PR Interval:    QRS Duration: 166 QT Interval:  437 QTC Calculation: 469 R Axis:   -72 Text Interpretation: Sinus rhythm Probable left atrial enlargement Right bundle branch block Inferior infarct, old Confirmed by Nat Christen [phone]) on 05/22/2019 8:50:40  PM   Radiology DG Chest Port 1 View  Result Date: 05/22/2019 CLINICAL DATA:  Sepsis. Fever and vomiting. EXAM: PORTABLE CHEST 1 VIEW COMPARISON:  Lung bases from abdominal CT yesterday at [facility]: Post median sternotomy. Prosthetic aortic valve. Mild cardiomegaly. No focal airspace disease, large pleural effusion, or pulmonary edema. No pneumothorax. No acute osseous abnormalities are seen. IMPRESSION: Mild cardiomegaly. No acute chest findings or evidence of pneumonia. Electronically Signed   By: Keith Rake M.D.   On: 05/22/2019 20:38    Procedures Procedures (including critical care time)  Medications Ordered in ED Medications  vancomycin (VANCOREADY) IVPB 2000 mg/400 mL (2,000 mg Intravenous New Bag/Given 05/22/19 2207)  ceFEPIme (MAXIPIME) 2 g in sodium chloride 0.9 % 100 mL IVPB (0 g Intravenous Stopped 05/22/19 2156)  metroNIDAZOLE (FLAGYL) IVPB 500 mg (0 mg Intravenous Stopped 05/22/19 2207)  acetaminophen (TYLENOL) suppository 975 mg (975 mg Rectal Given 05/22/19 2030)  sodium chloride 0.9 % bolus 1,000 mL (1,000 mLs Intravenous New Bag/Given 05/22/19 2207)  sodium chloride 0.9 % bolus 1,000 mL (1,000 mLs Intravenous New Bag/Given 05/22/19 2125)    ED Course  I have reviewed the triage vital signs and the nursing notes.  Pertinent labs & imaging results that were available during my care of the patient were reviewed by me and considered in my medical decision making (see chart for details).    MDM Rules/Calculators/A&P                      Patient presents with his vomiting and fever.  He is obtunded.  Will initiate sepsis work-up.  Admission.   CRITICAL CARE Performed  by: Nat Christen Total critical care time: 35 minutes Critical care time was exclusive of separately billable procedures and treating other patients. Critical care was necessary to treat or prevent imminent or life-threatening deterioration. Critical care was time spent personally by me on the  following activities: development of treatment plan with patient and/or surrogate as well as nursing, discussions with consultants, evaluation of patient's response to treatment, examination of patient, obtaining history from patient or surrogate, ordering and performing treatments and interventions, ordering and review of laboratory studies, ordering and review of radiographic studies, pulse oximetry and re-evaluation of patient's condition. Final Clinical Impression(s) / ED Diagnoses Final diagnoses:  Sepsis, due to unspecified organism, unspecified whether acute organ dysfunction present ([place])  AKI (acute kidney injury) ([place])  Hyperglycemia  Abscess of right foot    Rx / DC Orders ED Discharge Orders    None       Nat Christen, MD 05/22/19 2130    Nat Christen, MD 05/22/19 2241

## 2019-05-23 ENCOUNTER — Other Ambulatory Visit: Payer: Self-pay

## 2019-05-23 ENCOUNTER — Encounter (HOSPITAL_COMMUNITY): Payer: Self-pay | Admitting: Family Medicine

## 2019-05-23 ENCOUNTER — Inpatient Hospital Stay (HOSPITAL_COMMUNITY): Payer: Medicare Other

## 2019-05-23 DIAGNOSIS — M86171 Other acute osteomyelitis, right ankle and foot: Secondary | ICD-10-CM | POA: Diagnosis not present

## 2019-05-23 DIAGNOSIS — E785 Hyperlipidemia, unspecified: Secondary | ICD-10-CM | POA: Diagnosis present

## 2019-05-23 DIAGNOSIS — I739 Peripheral vascular disease, unspecified: Secondary | ICD-10-CM | POA: Diagnosis present

## 2019-05-23 DIAGNOSIS — E1110 Type 2 diabetes mellitus with ketoacidosis without coma: Secondary | ICD-10-CM | POA: Diagnosis present

## 2019-05-23 DIAGNOSIS — A419 Sepsis, unspecified organism: Secondary | ICD-10-CM | POA: Diagnosis not present

## 2019-05-23 DIAGNOSIS — E039 Hypothyroidism, unspecified: Secondary | ICD-10-CM | POA: Diagnosis present

## 2019-05-23 DIAGNOSIS — J9601 Acute respiratory failure with hypoxia: Secondary | ICD-10-CM | POA: Diagnosis not present

## 2019-05-23 DIAGNOSIS — Z89431 Acquired absence of right foot: Secondary | ICD-10-CM

## 2019-05-23 DIAGNOSIS — R0602 Shortness of breath: Secondary | ICD-10-CM | POA: Diagnosis not present

## 2019-05-23 DIAGNOSIS — E11621 Type 2 diabetes mellitus with foot ulcer: Secondary | ICD-10-CM | POA: Diagnosis not present

## 2019-05-23 DIAGNOSIS — N1831 Chronic kidney disease, stage 3a: Secondary | ICD-10-CM | POA: Diagnosis present

## 2019-05-23 DIAGNOSIS — A4181 Sepsis due to Enterococcus: Secondary | ICD-10-CM | POA: Diagnosis not present

## 2019-05-23 DIAGNOSIS — Z953 Presence of xenogenic heart valve: Secondary | ICD-10-CM | POA: Diagnosis not present

## 2019-05-23 DIAGNOSIS — B952 Enterococcus as the cause of diseases classified elsewhere: Secondary | ICD-10-CM | POA: Diagnosis present

## 2019-05-23 DIAGNOSIS — G4733 Obstructive sleep apnea (adult) (pediatric): Secondary | ICD-10-CM | POA: Diagnosis present

## 2019-05-23 DIAGNOSIS — I340 Nonrheumatic mitral (valve) insufficiency: Secondary | ICD-10-CM | POA: Diagnosis not present

## 2019-05-23 DIAGNOSIS — D649 Anemia, unspecified: Secondary | ICD-10-CM | POA: Diagnosis present

## 2019-05-23 DIAGNOSIS — N179 Acute kidney failure, unspecified: Secondary | ICD-10-CM

## 2019-05-23 DIAGNOSIS — I10 Essential (primary) hypertension: Secondary | ICD-10-CM | POA: Diagnosis not present

## 2019-05-23 DIAGNOSIS — Z66 Do not resuscitate: Secondary | ICD-10-CM | POA: Diagnosis not present

## 2019-05-23 DIAGNOSIS — R7881 Bacteremia: Secondary | ICD-10-CM | POA: Diagnosis present

## 2019-05-23 DIAGNOSIS — E876 Hypokalemia: Secondary | ICD-10-CM | POA: Diagnosis not present

## 2019-05-23 DIAGNOSIS — R739 Hyperglycemia, unspecified: Secondary | ICD-10-CM

## 2019-05-23 DIAGNOSIS — J810 Acute pulmonary edema: Secondary | ICD-10-CM | POA: Diagnosis not present

## 2019-05-23 DIAGNOSIS — Z951 Presence of aortocoronary bypass graft: Secondary | ICD-10-CM

## 2019-05-23 DIAGNOSIS — Z20822 Contact with and (suspected) exposure to covid-19: Secondary | ICD-10-CM | POA: Diagnosis not present

## 2019-05-23 DIAGNOSIS — E1122 Type 2 diabetes mellitus with diabetic chronic kidney disease: Secondary | ICD-10-CM | POA: Diagnosis present

## 2019-05-23 DIAGNOSIS — A4101 Sepsis due to Methicillin susceptible Staphylococcus aureus: Secondary | ICD-10-CM | POA: Diagnosis not present

## 2019-05-23 DIAGNOSIS — I2510 Atherosclerotic heart disease of native coronary artery without angina pectoris: Secondary | ICD-10-CM | POA: Diagnosis present

## 2019-05-23 DIAGNOSIS — E11628 Type 2 diabetes mellitus with other skin complications: Secondary | ICD-10-CM | POA: Diagnosis present

## 2019-05-23 DIAGNOSIS — D696 Thrombocytopenia, unspecified: Secondary | ICD-10-CM | POA: Diagnosis present

## 2019-05-23 DIAGNOSIS — I361 Nonrheumatic tricuspid (valve) insufficiency: Secondary | ICD-10-CM | POA: Diagnosis not present

## 2019-05-23 DIAGNOSIS — L97329 Non-pressure chronic ulcer of left ankle with unspecified severity: Secondary | ICD-10-CM | POA: Diagnosis not present

## 2019-05-23 DIAGNOSIS — E1151 Type 2 diabetes mellitus with diabetic peripheral angiopathy without gangrene: Secondary | ICD-10-CM | POA: Diagnosis present

## 2019-05-23 DIAGNOSIS — L97519 Non-pressure chronic ulcer of other part of right foot with unspecified severity: Secondary | ICD-10-CM | POA: Diagnosis not present

## 2019-05-23 DIAGNOSIS — B9561 Methicillin susceptible Staphylococcus aureus infection as the cause of diseases classified elsewhere: Secondary | ICD-10-CM | POA: Diagnosis not present

## 2019-05-23 DIAGNOSIS — Z952 Presence of prosthetic heart valve: Secondary | ICD-10-CM

## 2019-05-23 DIAGNOSIS — E109 Type 1 diabetes mellitus without complications: Secondary | ICD-10-CM | POA: Diagnosis not present

## 2019-05-23 DIAGNOSIS — N183 Chronic kidney disease, stage 3 unspecified: Secondary | ICD-10-CM | POA: Diagnosis present

## 2019-05-23 DIAGNOSIS — L02611 Cutaneous abscess of right foot: Secondary | ICD-10-CM | POA: Diagnosis not present

## 2019-05-23 LAB — BASIC METABOLIC PANEL
Anion gap: 20 — ABNORMAL HIGH (ref 5–15)
Anion gap: 21 — ABNORMAL HIGH (ref 5–15)
Anion gap: 13 (ref 5–15)
Anion gap: 9 (ref 5–15)
Anion gap: 10 (ref 5–15)
BUN: 48 mg/dL — ABNORMAL HIGH (ref 8–23)
BUN: 48 mg/dL — ABNORMAL HIGH (ref 8–23)
BUN: 52 mg/dL — ABNORMAL HIGH (ref 8–23)
BUN: 54 mg/dL — ABNORMAL HIGH (ref 8–23)
BUN: 52 mg/dL — ABNORMAL HIGH (ref 8–23)
CO2: 18 mmol/L — ABNORMAL LOW (ref 22–32)
CO2: 17 mmol/L — ABNORMAL LOW (ref 22–32)
CO2: 20 mmol/L — ABNORMAL LOW (ref 22–32)
CO2: 22 mmol/L (ref 22–32)
CO2: 22 mmol/L (ref 22–32)
Calcium: 8.1 mg/dL — ABNORMAL LOW (ref 8.9–10.3)
Calcium: 8.2 mg/dL — ABNORMAL LOW (ref 8.9–10.3)
Calcium: 7.9 mg/dL — ABNORMAL LOW (ref 8.9–10.3)
Calcium: 7.7 mg/dL — ABNORMAL LOW (ref 8.9–10.3)
Calcium: 8 mg/dL — ABNORMAL LOW (ref 8.9–10.3)
Chloride: 97 mmol/L — ABNORMAL LOW (ref 98–111)
Chloride: 97 mmol/L — ABNORMAL LOW (ref 98–111)
Chloride: 101 mmol/L (ref 98–111)
Chloride: 101 mmol/L (ref 98–111)
Chloride: 103 mmol/L (ref 98–111)
Creatinine, Ser: 1.78 mg/dL — ABNORMAL HIGH (ref 0.61–1.24)
Creatinine, Ser: 1.73 mg/dL — ABNORMAL HIGH (ref 0.61–1.24)
Creatinine, Ser: 1.56 mg/dL — ABNORMAL HIGH (ref 0.61–1.24)
Creatinine, Ser: 1.41 mg/dL — ABNORMAL HIGH (ref 0.61–1.24)
Creatinine, Ser: 1.35 mg/dL — ABNORMAL HIGH (ref 0.61–1.24)
GFR calc Af Amer: 43 mL/min — ABNORMAL LOW (ref >60)
GFR calc Af Amer: 45 mL/min — ABNORMAL LOW (ref >60)
GFR calc Af Amer: 51 mL/min — ABNORMAL LOW (ref >60)
GFR calc Af Amer: 57 mL/min — ABNORMAL LOW (ref >60)
GFR calc Af Amer: >60 mL/min (ref >60)
GFR calc non Af Amer: 37 mL/min — ABNORMAL LOW (ref >60)
GFR calc non Af Amer: 39 mL/min — ABNORMAL LOW (ref >60)
GFR calc non Af Amer: 44 mL/min — ABNORMAL LOW (ref >60)
GFR calc non Af Amer: 49 mL/min — ABNORMAL LOW (ref >60)
GFR calc non Af Amer: 52 mL/min — ABNORMAL LOW (ref >60)
Glucose, Bld: 356 mg/dL — ABNORMAL HIGH (ref 70–99)
Glucose, Bld: 360 mg/dL — ABNORMAL HIGH (ref 70–99)
Glucose, Bld: 217 mg/dL — ABNORMAL HIGH (ref 70–99)
Glucose, Bld: 188 mg/dL — ABNORMAL HIGH (ref 70–99)
Glucose, Bld: 141 mg/dL — ABNORMAL HIGH (ref 70–99)
Potassium: 3.3 mmol/L — ABNORMAL LOW (ref 3.5–5.1)
Potassium: 3.3 mmol/L — ABNORMAL LOW (ref 3.5–5.1)
Potassium: 3 mmol/L — ABNORMAL LOW (ref 3.5–5.1)
Potassium: 3.1 mmol/L — ABNORMAL LOW (ref 3.5–5.1)
Potassium: 3 mmol/L — ABNORMAL LOW (ref 3.5–5.1)
Sodium: 135 mmol/L (ref 135–145)
Sodium: 135 mmol/L (ref 135–145)
Sodium: 134 mmol/L — ABNORMAL LOW (ref 135–145)
Sodium: 132 mmol/L — ABNORMAL LOW (ref 135–145)
Sodium: 135 mmol/L (ref 135–145)

## 2019-05-23 LAB — COMPREHENSIVE METABOLIC PANEL
ALT: 21 U/L (ref 0–44)
AST: 34 U/L (ref 15–41)
Albumin: 3.2 g/dL — ABNORMAL LOW (ref 3.5–5.0)
Alkaline Phosphatase: 41 U/L (ref 38–126)
Anion gap: 13 (ref 5–15)
BUN: 51 mg/dL — ABNORMAL HIGH (ref 8–23)
CO2: 21 mmol/L — ABNORMAL LOW (ref 22–32)
Calcium: 8 mg/dL — ABNORMAL LOW (ref 8.9–10.3)
Chloride: 100 mmol/L (ref 98–111)
Creatinine, Ser: 1.59 mg/dL — ABNORMAL HIGH (ref 0.61–1.24)
GFR calc Af Amer: 50 mL/min — ABNORMAL LOW (ref >60)
GFR calc non Af Amer: 43 mL/min — ABNORMAL LOW (ref >60)
Glucose, Bld: 206 mg/dL — ABNORMAL HIGH (ref 70–99)
Potassium: 2.9 mmol/L — ABNORMAL LOW (ref 3.5–5.1)
Sodium: 134 mmol/L — ABNORMAL LOW (ref 135–145)
Total Bilirubin: 0.9 mg/dL (ref 0.3–1.2)
Total Protein: 6.5 g/dL (ref 6.5–8.1)

## 2019-05-23 LAB — SEDIMENTATION RATE: Sed Rate: 60 mm/hr — ABNORMAL HIGH (ref 0–16)

## 2019-05-23 LAB — GLUCOSE, CAPILLARY
Glucose-Capillary: 299 mg/dL — ABNORMAL HIGH (ref 70–99)
Glucose-Capillary: 232 mg/dL — ABNORMAL HIGH (ref 70–99)
Glucose-Capillary: 186 mg/dL — ABNORMAL HIGH (ref 70–99)
Glucose-Capillary: 189 mg/dL — ABNORMAL HIGH (ref 70–99)
Glucose-Capillary: 216 mg/dL — ABNORMAL HIGH (ref 70–99)
Glucose-Capillary: 200 mg/dL — ABNORMAL HIGH (ref 70–99)
Glucose-Capillary: 171 mg/dL — ABNORMAL HIGH (ref 70–99)
Glucose-Capillary: 117 mg/dL — ABNORMAL HIGH (ref 70–99)
Glucose-Capillary: 127 mg/dL — ABNORMAL HIGH (ref 70–99)
Glucose-Capillary: 157 mg/dL — ABNORMAL HIGH (ref 70–99)
Glucose-Capillary: 134 mg/dL — ABNORMAL HIGH (ref 70–99)

## 2019-05-23 LAB — BLOOD CULTURE ID PANEL (REFLEXED)
Acinetobacter baumannii: NOT DETECTED
Candida albicans: NOT DETECTED
Candida glabrata: NOT DETECTED
Candida krusei: NOT DETECTED
Candida parapsilosis: NOT DETECTED
Candida tropicalis: NOT DETECTED
Enterobacter cloacae complex: NOT DETECTED
Enterobacteriaceae species: NOT DETECTED
Enterococcus species: DETECTED — AB
Escherichia coli: NOT DETECTED
Haemophilus influenzae: NOT DETECTED
Klebsiella oxytoca: NOT DETECTED
Klebsiella pneumoniae: NOT DETECTED
Listeria monocytogenes: NOT DETECTED
Methicillin resistance: NOT DETECTED
Neisseria meningitidis: NOT DETECTED
Proteus species: NOT DETECTED
Pseudomonas aeruginosa: NOT DETECTED
Serratia marcescens: NOT DETECTED
Staphylococcus aureus (BCID): DETECTED — AB
Staphylococcus species: DETECTED — AB
Streptococcus agalactiae: NOT DETECTED
Streptococcus pneumoniae: NOT DETECTED
Streptococcus pyogenes: NOT DETECTED
Streptococcus species: NOT DETECTED
Vancomycin resistance: NOT DETECTED

## 2019-05-23 LAB — BETA-HYDROXYBUTYRIC ACID
Beta-Hydroxybutyric Acid: 6.39 mmol/L — ABNORMAL HIGH (ref 0.05–0.27)
Beta-Hydroxybutyric Acid: 1.16 mmol/L — ABNORMAL HIGH (ref 0.05–0.27)
Beta-Hydroxybutyric Acid: 0.24 mmol/L (ref 0.05–0.27)

## 2019-05-23 LAB — CBG MONITORING, ED
Glucose-Capillary: 289 mg/dL — ABNORMAL HIGH (ref 70–99)
Glucose-Capillary: 329 mg/dL — ABNORMAL HIGH (ref 70–99)
Glucose-Capillary: 389 mg/dL — ABNORMAL HIGH (ref 70–99)

## 2019-05-23 LAB — CBC
HCT: 35.9 % — ABNORMAL LOW (ref 39.0–52.0)
Hemoglobin: 11.9 g/dL — ABNORMAL LOW (ref 13.0–17.0)
MCH: 31.7 pg (ref 26.0–34.0)
MCHC: 33.1 g/dL (ref 30.0–36.0)
MCV: 95.7 fL (ref 80.0–100.0)
Platelets: 115 10*3/uL — ABNORMAL LOW (ref 150–400)
RBC: 3.75 MIL/uL — ABNORMAL LOW (ref 4.22–5.81)
RDW: 13.4 % (ref 11.5–15.5)
WBC: 9.3 10*3/uL (ref 4.0–10.5)
nRBC: 0 % (ref 0.0–0.2)

## 2019-05-23 LAB — MAGNESIUM: Magnesium: 2.2 mg/dL (ref 1.7–2.4)

## 2019-05-23 LAB — C-REACTIVE PROTEIN: CRP: 18 mg/dL — ABNORMAL HIGH (ref <1.0)

## 2019-05-23 LAB — MRSA PCR SCREENING: MRSA by PCR: NEGATIVE

## 2019-05-23 MED ORDER — VANCOMYCIN HCL 750 MG/150ML IV SOLN: 750.0000 mg | Freq: Two times a day (BID) | INTRAVENOUS | Status: DC

## 2019-05-23 MED ORDER — INSULIN GLARGINE 100 UNIT/ML ~~LOC~~ SOLN
30.0000 [IU] | Freq: Two times a day (BID) | SUBCUTANEOUS | Status: DC
  Administered 2019-05-24 – 2019-05-30 (×12): 30 [IU] via SUBCUTANEOUS
  Filled 2019-05-23 (×17): qty 0.3

## 2019-05-23 MED ORDER — SODIUM CHLORIDE 0.9 % IV SOLN: INTRAVENOUS | Status: DC

## 2019-05-23 MED ORDER — FENOFIBRATE 160 MG PO TABS
160.0000 mg | ORAL_TABLET | Freq: Every day | ORAL | Status: DC
  Administered 2019-05-23 – 2019-05-30 (×8): 160 mg via ORAL
  Filled 2019-05-23 (×9): qty 1

## 2019-05-23 MED ORDER — ASPIRIN 81 MG PO CHEW
81.0000 mg | CHEWABLE_TABLET | Freq: Every day | ORAL | Status: DC
  Administered 2019-05-23 – 2019-05-30 (×8): 81 mg via ORAL
  Filled 2019-05-23 (×8): qty 1

## 2019-05-23 MED ORDER — POTASSIUM CHLORIDE 10 MEQ/100ML IV SOLN
10.0000 meq | INTRAVENOUS | Status: AC
  Administered 2019-05-23 (×2): 10 meq via INTRAVENOUS
  Filled 2019-05-23 (×2): qty 100

## 2019-05-23 MED ORDER — ACETAMINOPHEN 650 MG RE SUPP: 650.0000 mg | Freq: Four times a day (QID) | RECTAL | Status: DC | PRN

## 2019-05-23 MED ORDER — LEVOTHYROXINE SODIUM 25 MCG PO TABS: 50.0000 ug | ORAL_TABLET | Freq: Every day | ORAL | Status: DC

## 2019-05-23 MED ORDER — METOCLOPRAMIDE HCL 10 MG PO TABS
10.0000 mg | ORAL_TABLET | Freq: Three times a day (TID) | ORAL | Status: DC | PRN
  Administered 2019-05-23: 10 mg via ORAL
  Filled 2019-05-23: qty 1

## 2019-05-23 MED ORDER — CLOPIDOGREL BISULFATE 75 MG PO TABS
75.0000 mg | ORAL_TABLET | Freq: Every day | ORAL | Status: DC
  Administered 2019-05-23 – 2019-05-30 (×8): 75 mg via ORAL
  Filled 2019-05-23 (×8): qty 1

## 2019-05-23 MED ORDER — ONDANSETRON HCL 4 MG PO TABS: 4.0000 mg | ORAL_TABLET | Freq: Four times a day (QID) | ORAL | Status: DC | PRN

## 2019-05-23 MED ORDER — PRAVASTATIN SODIUM 40 MG PO TABS
40.0000 mg | ORAL_TABLET | Freq: Every day | ORAL | Status: DC
  Administered 2019-05-23 – 2019-05-30 (×8): 40 mg via ORAL
  Filled 2019-05-23 (×8): qty 1

## 2019-05-23 MED ORDER — AMLODIPINE BESYLATE 5 MG PO TABS
10.0000 mg | ORAL_TABLET | Freq: Every day | ORAL | Status: DC
  Administered 2019-05-23 – 2019-05-30 (×8): 10 mg via ORAL
  Filled 2019-05-23 (×8): qty 2

## 2019-05-23 MED ORDER — GABAPENTIN 400 MG PO CAPS
1200.0000 mg | ORAL_CAPSULE | Freq: Three times a day (TID) | ORAL | Status: DC
  Administered 2019-05-23 – 2019-05-30 (×22): 1200 mg via ORAL
  Filled 2019-05-23 (×22): qty 3

## 2019-05-23 MED ORDER — HYDRALAZINE HCL 25 MG PO TABS: 25.0000 mg | ORAL_TABLET | Freq: Four times a day (QID) | ORAL | Status: DC | PRN

## 2019-05-23 MED ORDER — CHLORHEXIDINE GLUCONATE CLOTH 2 % EX PADS
6.0000 | MEDICATED_PAD | Freq: Every day | CUTANEOUS | Status: DC
  Administered 2019-05-23 – 2019-05-29 (×6): 6 via TOPICAL

## 2019-05-23 MED ORDER — ACETAMINOPHEN 325 MG PO TABS
650.0000 mg | ORAL_TABLET | Freq: Four times a day (QID) | ORAL | Status: DC | PRN
  Administered 2019-05-24: 650 mg via ORAL
  Filled 2019-05-23: qty 2

## 2019-05-23 MED ORDER — DEXTROSE 50 % IV SOLN: 0.0000 mL | INTRAVENOUS | Status: DC | PRN

## 2019-05-23 MED ORDER — ONDANSETRON HCL 4 MG/2ML IJ SOLN
4.0000 mg | Freq: Four times a day (QID) | INTRAMUSCULAR | Status: DC | PRN
  Administered 2019-05-23 – 2019-05-24 (×4): 4 mg via INTRAVENOUS
  Filled 2019-05-23 (×4): qty 2

## 2019-05-23 MED ORDER — POTASSIUM CHLORIDE ER 10 MEQ PO TBCR: 10.0000 meq | EXTENDED_RELEASE_TABLET | Freq: Two times a day (BID) | ORAL | Status: DC

## 2019-05-23 MED ORDER — ISOSORBIDE MONONITRATE ER 60 MG PO TB24
30.0000 mg | ORAL_TABLET | Freq: Every day | ORAL | Status: DC
  Administered 2019-05-23 – 2019-05-30 (×8): 30 mg via ORAL
  Filled 2019-05-23 (×8): qty 1

## 2019-05-23 MED ORDER — PANTOPRAZOLE SODIUM 40 MG PO TBEC
40.0000 mg | DELAYED_RELEASE_TABLET | Freq: Every day | ORAL | Status: DC
  Administered 2019-05-23 – 2019-05-30 (×8): 40 mg via ORAL
  Filled 2019-05-23 (×8): qty 1

## 2019-05-23 MED ORDER — POTASSIUM CHLORIDE 10 MEQ/100ML IV SOLN
10.0000 meq | INTRAVENOUS | Status: AC
  Administered 2019-05-23 (×4): 10 meq via INTRAVENOUS
  Filled 2019-05-23: qty 400

## 2019-05-23 MED ORDER — INSULIN REGULAR(HUMAN) IN NACL 100-0.9 UT/100ML-% IV SOLN
INTRAVENOUS | Status: DC
  Administered 2019-05-23: 7 [IU]/h via INTRAVENOUS
  Administered 2019-05-23: 13 [IU]/h via INTRAVENOUS
  Filled 2019-05-23 (×2): qty 100

## 2019-05-23 MED ORDER — SODIUM CHLORIDE 0.9 % IV SOLN
3.0000 g | Freq: Four times a day (QID) | INTRAVENOUS | Status: DC
  Administered 2019-05-23 – 2019-05-30 (×27): 3 g via INTRAVENOUS
  Filled 2019-05-23: qty 3
  Filled 2019-05-23: qty 8
  Filled 2019-05-23 (×2): qty 3
  Filled 2019-05-23 (×4): qty 8
  Filled 2019-05-23: qty 3
  Filled 2019-05-23 (×4): qty 8
  Filled 2019-05-23 (×2): qty 3
  Filled 2019-05-23: qty 8
  Filled 2019-05-23: qty 3
  Filled 2019-05-23: qty 8
  Filled 2019-05-23: qty 3
  Filled 2019-05-23 (×14): qty 8
  Filled 2019-05-23: qty 3

## 2019-05-23 MED ORDER — POTASSIUM CHLORIDE 10 MEQ/100ML IV SOLN
10.0000 meq | INTRAVENOUS | Status: AC
  Administered 2019-05-23 (×4): 10 meq via INTRAVENOUS
  Filled 2019-05-23: qty 100

## 2019-05-23 MED ORDER — LEVOTHYROXINE SODIUM 50 MCG PO TABS
50.0000 ug | ORAL_TABLET | Freq: Every day | ORAL | Status: DC
  Administered 2019-05-23 – 2019-05-30 (×8): 50 ug via ORAL
  Filled 2019-05-23: qty 1
  Filled 2019-05-23: qty 2
  Filled 2019-05-23 (×5): qty 1
  Filled 2019-05-23: qty 2

## 2019-05-23 MED ORDER — ESCITALOPRAM OXALATE 10 MG PO TABS
10.0000 mg | ORAL_TABLET | Freq: Every day | ORAL | Status: DC
  Administered 2019-05-23 – 2019-05-30 (×8): 10 mg via ORAL
  Filled 2019-05-23 (×8): qty 1

## 2019-05-23 MED ORDER — VITAMIN D 25 MCG (1000 UNIT) PO TABS
2000.0000 [IU] | ORAL_TABLET | Freq: Every day | ORAL | Status: DC
  Administered 2019-05-23 – 2019-05-30 (×8): 2000 [IU] via ORAL
  Filled 2019-05-23 (×8): qty 2

## 2019-05-23 MED ORDER — SODIUM CHLORIDE 0.9 % IV SOLN
2.0000 g | Freq: Two times a day (BID) | INTRAVENOUS | Status: DC
  Administered 2019-05-23: 2 g via INTRAVENOUS
  Filled 2019-05-23: qty 2

## 2019-05-23 MED ORDER — DEXTROSE-NACL 5-0.45 % IV SOLN: INTRAVENOUS | Status: DC

## 2019-05-23 MED ORDER — ENOXAPARIN SODIUM 40 MG/0.4ML ~~LOC~~ SOLN
40.0000 mg | SUBCUTANEOUS | Status: DC
  Administered 2019-05-23 – 2019-05-26 (×2): 40 mg via SUBCUTANEOUS
  Filled 2019-05-23 (×7): qty 0.4

## 2019-05-23 NOTE — ED Notes (Signed)
Pt's O2 ranging 88-89% on RA. Placed pt on 2LPM via [place] O2 94% now.

## 2019-05-23 NOTE — Progress Notes (Addendum)
Pharmacy Antibiotic Note  Larry Hart is a 74 y.o. male admitted on 05/22/2019 with sepsis.  Pharmacy has been consulted for unasyn dosing. BCID + Enterococcus and MSSA in 4/4 bottles. D/W Dr. Manuella Ghazi and will change to UNASYN Plan: Unasyn 3gm IV q6h F/U cxs and clinical progress Monitor V/S, labs  Height: 5\' 9"  (175.3 cm) Weight: 218 lb 4.8 oz (99 kg) IBW/kg (Calculated) : 70.7  Temp (24hrs), Avg:101.9 F (38.8 C), Min:99 F (37.2 C), Max:103.6 F (39.8 C)  Recent Labs  Lab 05/22/19 1952 05/22/19 1952 05/22/19 2046 05/22/19 2209 05/23/19 0055 05/23/19 0232 05/23/19 0742 05/23/19 1047 05/23/19 1328  WBC 7.2  --   --   --   --   --  9.3  --   --   CREATININE 1.67*   < >  --   --  1.78* 1.73* 1.59* 1.56* 1.41*  LATICACIDVEN  --   --  2.4* 2.3*  --   --   --   --   --    < > = values in this interval not displayed.    Estimated Creatinine Clearance: 54.9 mL/min (A) (by C-G formula based on SCr of 1.41 mg/dL (H)).    Allergies  Allergen Reactions  . Honey Bee Treatment [Bee Venom]   . Other Palpitations and Rash    Histalet, tears up nerves,possible rash    Antimicrobials this admission: unasyn 2/10>> Vancomycin 2/9 >> 2/10 Cefepime 2/9 >> 2/10 Flagyl IV x 1 dose in ED 2/9  Dose adjustments this admission: prn  Microbiology results: 2/9 BCx:  Enterococcus species NOT DETECTED DETECTEDAbnormal    Comment: CRITICAL RESULT CALLED TO, READ BACK BY AND VERIFIED WITH:  PHARMD J COFFEE [id number] AT 1410 BY CM   Vancomycin resistance NOT DETECTED NOT DETECTED   Listeria monocytogenes NOT DETECTED NOT DETECTED   Staphylococcus species NOT DETECTED DETECTEDAbnormal    Comment: CRITICAL RESULT CALLED TO, READ BACK BY AND VERIFIED WITH:  PHARMD J COFFEE [id number] AT 1410 BY CM   Staphylococcus aureus (BCID) NOT DETECTED DETECTEDAbnormal    Comment: Methicillin (oxacillin) susceptible Staphylococcus aureus (MSSA). Preferred therapy is anti staphylococcal beta lactam antibiotic  (Cefazolin or Nafcillin), unless clinically contraindicated.  CRITICAL RESULT CALLED TO, READ BACK BY AND VERIFIED WITH:  PHARMD J COFFEE [id number] AT 8 BY CM    2/9 UCx: pending 2/9 Influenza and SARS-2 CV is negative 2/9 MRSA PCR: negative  Thank you for allowing pharmacy to be a part of this patient's care.  Isac Sarna, BS Pharm D, BCPS Clinical Pharmacist Pager [phone] 05/23/2019 3:12 PM

## 2019-05-23 NOTE — Care Plan (Signed)
CRITICAL VALUE ALERT  Critical Value:  Gram + cocci in both blood cultures  Date & Time Notied:  05/23/19 5041  Provider Notified: Dr. Manuella Ghazi  Orders Received/Actions taken: See new orders

## 2019-05-23 NOTE — Progress Notes (Signed)
Per HPI: Larry Hart  is a 74 y.o. male, with history of diabetes mellitus type 2, hypertension, dyslipidemia, peripheral vascular disease, diabetic foot ulcer, s/p right transmetatarsal amputation who was brought to the hospital by patient's wife with chief complaints of vomiting since Sunday, generalized weakness.  Patient has also been confused intermittently, and not talking like his usual self as per wife. Patient has diabetic foot ulcer and was seen by vascular surgery, he had cast on right foot which was removed last week.  At that time the ulcer was looking improved. However today in the ED  patient has serosanguineous discharge from the ulcer and right foot. Patient empirically started on vancomycin and cefepime for infected diabetic foot ulcer. No history of chest pain or shortness of breath. Patient has fever of 103.6. Blood pressure is stable, he is tachypneic.  2/10: Patient has been admitted with sepsis from infected diabetic foot ulcer and was started on vancomycin and cefepime.  He is noted to have MSSA bacteremia for which he has now been transitioned to Unasyn.  2D echocardiogram has been ordered with results pending.  He remains on IV insulin drip as well as D5 fluids as beta hydroxybutyric acid remains elevated.  We will continue to monitor and likely transition to a diet in the morning once DKA pathology has resolved.  AKI is slowly improving.  Potassium is being supplemented.  We will plan to repeat blood cultures in a.m.  Total care time: 30 minutes.

## 2019-05-23 NOTE — H&P (Signed)
TRH H&P    Patient Demographics:    Larry Hart, is a 74 y.o. male  MRN: [id number]  DOB - February 13, 1946  Admit Date - 05/22/2019  Referring MD/NP/PA: Nat Christen  Outpatient Primary MD for the patient is Rory Percy, MD  Patient coming from: Home  Chief complaint-generalized weakness   HPI:    Larry Hart  is a 74 y.o. male, with history of diabetes mellitus type 2, hypertension, dyslipidemia, peripheral vascular disease, diabetic foot ulcer, s/p right transmetatarsal amputation who was brought to the hospital by patient's wife with chief complaints of vomiting since Sunday, generalized weakness.  Patient has also been confused intermittently, and not talking like his usual self as per wife. Patient has diabetic foot ulcer and was seen by vascular surgery, he had cast on right foot which was removed last week.  At that time the ulcer was looking improved. However today in the ED  patient has serosanguineous discharge from the ulcer and right foot. Patient empirically started on vancomycin and cefepime for infected diabetic foot ulcer. No history of chest pain or shortness of breath. Patient has fever of 103.6. Blood pressure is stable, he is tachypneic.   Review of systems:    In addition to the HPI above,    All other systems reviewed and are negative.    Past History of the following :    Past Medical History:  Diagnosis Date  . Cancer ([place])    skin  . Colon polyp    Status post hemicolectomy  . Diabetes mellitus ([place])   . Dyslipidemia   . Dyspnea on exertion   . Hypertension   . Low serum triglycerides   . Low testosterone   . 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 Peripheral vascular disease ([place])   . Sinus bradycardia   . Thyroid disorder    low      Past Surgical History:  Procedure Laterality Date  . AMPUTATION     x5  . HEMICOLECTOMY    . OTHER SURGICAL HISTORY     gastroinstestinal surgery      Social History:      Social History   Tobacco Use  . Smoking status: Never Smoker  . Smokeless tobacco: Never Used  Substance Use Topics  . Alcohol use: No    Alcohol/week: 0.0 standard drinks       Family History :     Family History  Problem Relation Age of Onset  . Coronary artery disease Father 67  . Hypertension Mother   . Stroke Mother   . Diabetes Other       Home Medications:   Prior to Admission medications   Medication Sig Start Date End Date Taking? Authorizing Provider  amLODipine (NORVASC) 10 MG tablet Take 10 mg by mouth daily. 04/30/19  Yes [provider]  Ascorbic Acid (VITAMIN C) 1000 MG tablet Take 1,000 mg by mouth daily.   Yes [provider]  aspirin 81 MG tablet Take 81 mg by mouth daily.  Yes [provider]  cetirizine (ZYRTEC) 10 MG tablet Take 10 mg by mouth daily as needed for allergies.   Yes [provider]  chlorthalidone (HYGROTON) 25 MG tablet Take 25 mg by mouth daily. 05/03/19  Yes [provider]  Cholecalciferol (VITAMIN D3) 2000 UNITS TABS Take 1 tablet by mouth daily.    Yes [provider]  clopidogrel (PLAVIX) 75 MG tablet Take 75 mg by mouth daily. 04/30/19  Yes [provider]  Coenzyme Q10 100 MG TABS Take 1 tablet by mouth daily.    Yes [provider]  escitalopram (LEXAPRO) 10 MG tablet Take 10 mg by mouth daily.    Yes [provider]  fenofibrate 160 MG tablet Take 160 mg by mouth daily.   Yes [provider]  gabapentin (NEURONTIN) 600 MG tablet Take 1,200 mg by mouth 3 (three) times daily.    Yes [provider]  insulin glargine (LANTUS) 100 UNIT/ML injection Inject 30 Units into the skin 2 (two) times daily.    Yes [provider]  isosorbide mononitrate (IMDUR) 30 MG 24 hr tablet Take 30 mg by mouth daily. 02/09/19  Yes [provider]  ketoconazole (NIZORAL) 2 % cream  Apply 1 application topically daily as needed for irritation.  05/13/14  Yes [provider]  levothyroxine (SYNTHROID, LEVOTHROID) 50 MCG tablet Take 50 mcg by mouth daily before breakfast.   Yes [provider]  losartan (COZAAR) 100 MG tablet Take 100 mg by mouth daily. 04/30/19  Yes [provider]  metoCLOPramide (REGLAN) 10 MG tablet Take 10 mg by mouth every 8 (eight) hours as needed for nausea or vomiting.  05/22/19  Yes [provider]  Multiple Vitamin (MULTIVITAMIN) capsule Take 1 capsule by mouth daily. Adults 50+   Yes [provider]  NITROSTAT 0.4 MG SL tablet Place 0.4 mg under the tongue every 5 (five) minutes as needed for chest pain.  01/06/12  Yes [provider]  NOVOLIN R RELION 100 UNIT/ML injection Inject 20-50 Units into the skin 3 (three) times daily before meals. Per sliding scale as directed per dietician 03/18/19  Yes [provider]  omega-3 acid ethyl esters (LOVAZA) 1 G capsule Take 2 g by mouth 2 (two) times daily.   Yes [provider]  pantoprazole (PROTONIX) 40 MG tablet Take 40 mg by mouth daily. 05/22/19  Yes [provider]  potassium chloride (KLOR-CON) 10 MEQ tablet Take 10 mEq by mouth 2 (two) times daily.   Yes [provider]  potassium chloride SA (KLOR-CON M20) 20 MEQ tablet Take 20 mEq by mouth 2 (two) times daily.  05/09/17  Yes [provider]  pravastatin (PRAVACHOL) 40 MG tablet Take 40 mg by mouth daily. 04/30/19  Yes [provider]     Allergies:     Allergies  Allergen Reactions  . Honey Bee Treatment [Bee Venom]   . Other Palpitations and Rash    Histalet, tears up nerves,possible rash     Physical Exam:   Vitals  Blood pressure (!) 163/81, pulse 79, temperature (!) 103.2 F (39.6 C), temperature source Rectal, resp. rate (!) 29, height 5\' 9"  (1.753 m), weight 99 kg, SpO2 94 %.  1.  General: Appears lethargic  2.  Psychiatric: Patient is lethargic but arousable  3. Neurologic: Lethargic, arousable, answering questions, moving all extremities  4. HEENMT:  Atraumatic normocephalic, extraocular muscle intact  5. Respiratory : Clear to auscultation bilaterally, no wheezing or  crackles  6. Cardiovascular : S1-S2, regular, no murmur auscultated  7. Gastrointestinal:  Abdomen is soft, nontender, no organomegaly  8.  Musculoskeletal: S/p transmetatarsal amputation, large ulcer noted on the plantar aspect of right foot.  Serosanguineous discharge noted by putting pressure around the ulcer.    Data Review:    CBC Recent Labs  Lab 05/22/19 1952  WBC 7.2  HGB 13.5  HCT 40.7  PLT 119*  MCV 94.7  MCH 31.4  MCHC 33.2  RDW 13.3   ------------------------------------------------------------------------------------------------------------------  Results for orders placed or performed during the hospital encounter of 05/22/19 (from the past 48 hour(s))  CBG monitoring, ED     Status: Abnormal   Collection Time: 05/22/19  7:11 PM  Result Value Ref Range   Glucose-Capillary 357 (H) 70 - 99 mg/dL  Urinalysis, Routine w reflex microscopic     Status: Abnormal   Collection Time: 05/22/19  7:52 PM  Result Value Ref Range   Color, Urine YELLOW YELLOW   APPearance CLEAR CLEAR   Specific Gravity, Urine 1.026 1.005 - 1.030   pH 5.0 5.0 - 8.0   Glucose, UA >=500 (A) NEGATIVE mg/dL   Hgb urine dipstick SMALL (A) NEGATIVE   Bilirubin Urine NEGATIVE NEGATIVE   Ketones, ur 20 (A) NEGATIVE mg/dL   Protein, ur 30 (A) NEGATIVE mg/dL   Nitrite NEGATIVE NEGATIVE   Leukocytes,Ua NEGATIVE NEGATIVE   RBC / HPF 0-5 0 - 5 RBC/hpf   WBC, UA 0-5 0 - 5 WBC/hpf   Bacteria, UA NONE SEEN NONE SEEN    Comment: Performed at [facility], [address], [place], [place] [address]  CBC     Status: Abnormal   Collection Time: 05/22/19  7:52 PM  Result Value Ref Range   WBC 7.2 4.0 - 10.5 K/uL   RBC 4.30 4.22 - 5.81  MIL/uL   Hemoglobin 13.5 13.0 - 17.0 g/dL   HCT 40.7 39.0 - 52.0 %   MCV 94.7 80.0 - 100.0 fL   MCH 31.4 26.0 - 34.0 pg   MCHC 33.2 30.0 - 36.0 g/dL   RDW 13.3 11.5 - 15.5 %   Platelets 119 (L) 150 - 400 K/uL   nRBC 0.0 0.0 - 0.2 %    Comment: Performed at [facility], [address], [place], [place] [address]  Comprehensive metabolic panel     Status: Abnormal   Collection Time: 05/22/19  7:52 PM  Result Value Ref Range   Sodium 132 (L) 135 - 145 mmol/L   Potassium 3.5 3.5 - 5.1 mmol/L   Chloride 91 (L) 98 - 111 mmol/L   CO2 20 (L) 22 - 32 mmol/L   Glucose, Bld 371 (H) 70 - 99 mg/dL   BUN 47 (H) 8 - 23 mg/dL   Creatinine, Ser 1.67 (H) 0.61 - 1.24 mg/dL   Calcium 8.9 8.9 - 10.3 mg/dL   Total Protein 7.7 6.5 - 8.1 g/dL   Albumin 3.7 3.5 - 5.0 g/dL   AST 23 15 - 41 U/L   ALT 19 0 - 44 U/L   Alkaline Phosphatase 53 38 - 126 U/L   Total Bilirubin 1.7 (H) 0.3 - 1.2 mg/dL   GFR calc non Af Amer 40 (L) >60 mL/min   GFR calc Af Amer 47 (L) >60 mL/min   Anion gap >20 (H) 5 - 15    Comment: Performed at [facility], [address] [place], [place] [address]  Troponin I (High Sensitivity)  Status: Abnormal   Collection Time: 05/22/19  7:52 PM  Result Value Ref Range   Troponin I (High Sensitivity) 61 (H) <18 ng/L    Comment: (NOTE) Elevated high sensitivity troponin I (hsTnI) values and significant  changes across serial measurements may suggest ACS but many other  chronic and acute conditions are known to elevate hsTnI results.  Refer to the Links section for chest pain algorithms and additional  guidance. Performed at [facility], [address] [place] [address], [place], [place] [address]   APTT     Status: None   Collection Time: 05/22/19  7:52 PM  Result Value Ref Range   aPTT 29 24 - 36 seconds    Comment: Performed at [facility], [address] [place], [place] [address]  Protime-INR     Status: None   Collection Time: 05/22/19  7:52 PM  Result Value Ref Range   Prothrombin Time  14.9 11.4 - 15.2 seconds   INR 1.2 0.8 - 1.2    Comment: (NOTE) INR goal varies based on device and disease states. Performed at [facility], [address] [place], [place] [address]   Lactic acid, plasma     Status: Abnormal   Collection Time: 05/22/19  8:46 PM  Result Value Ref Range   Lactic Acid, Venous 2.4 (HH) 0.5 - 1.9 mmol/L    Comment: CRITICAL RESULT CALLED TO, READ BACK BY AND VERIFIED WITH: MYRICK,B AT 2145 ON 2.9.21 BY ISLEY,B Performed at [facility], [address] [place], [place] [address]   Respiratory Panel by RT PCR (Flu A&B, Covid) - Nasopharyngeal Swab     Status: None   Collection Time: 05/22/19  9:37 PM   Specimen: Nasopharyngeal Swab  Result Value Ref Range   SARS Coronavirus 2 by RT PCR NEGATIVE NEGATIVE    Comment: (NOTE) SARS-CoV-2 target nucleic acids are NOT DETECTED. The SARS-CoV-2 RNA is generally detectable in upper respiratoy specimens during the acute phase of infection. The lowest concentration of SARS-CoV-2 viral copies this assay can detect is 131 copies/mL. A negative result does not preclude SARS-Cov-2 infection and should not be used as the sole basis for treatment or other patient management decisions. A negative result may occur with  improper specimen collection/handling, submission of specimen other than nasopharyngeal swab, presence of viral mutation(s) within the areas targeted by this assay, and inadequate number of viral copies (<131 copies/mL). A negative result must be combined with clinical observations, patient history, and epidemiological information. The expected result is Negative. Fact Sheet for Patients:  PinkCheek.be Fact Sheet for Healthcare Providers:  GravelBags.it This test is not yet ap proved or cleared by the [place] FDA and  has been authorized for detection and/or diagnosis of SARS-CoV-2 by FDA under an Emergency Use Authorization (EUA). This EUA  will remain  in effect (meaning this test can be used) for the duration of the COVID-19 declaration under Section 564(b)(1) of the Act, 21 U.S.C. section 360bbb-3(b)(1), unless the authorization is terminated or revoked sooner.    Influenza A by PCR NEGATIVE NEGATIVE   Influenza B by PCR NEGATIVE NEGATIVE    Comment: (NOTE) The Xpert Xpress SARS-CoV-2/FLU/RSV assay is intended as an aid in  the diagnosis of influenza from Nasopharyngeal swab specimens and  should not be used as a sole basis for treatment. Nasal washings and  aspirates are unacceptable for Xpert Xpress SARS-CoV-2/FLU/RSV  testing. Fact Sheet for Patients: PinkCheek.be Fact Sheet for Healthcare Providers: GravelBags.it This test is not yet approved or cleared  by the [place] and  has been authorized for detection and/or diagnosis of SARS-CoV-2 by  FDA under an Emergency Use Authorization (EUA). This EUA will remain  in effect (meaning this test can be used) for the duration of the  Covid-19 declaration under Section 564(b)(1) of the Act, 21  U.S.C. section 360bbb-3(b)(1), unless the authorization is  terminated or revoked. Performed at [facility], [address], [place], [place] [address]   Lactic acid, plasma     Status: Abnormal   Collection Time: 05/22/19 10:09 PM  Result Value Ref Range   Lactic Acid, Venous 2.3 (HH) 0.5 - 1.9 mmol/L    Comment: CRITICAL RESULT CALLED TO, READ BACK BY AND VERIFIED WITH: [place],K AT 2236 ON 2.9.21 BY ISLEY,B Performed at [facility], [address] [place], [place] [address]   Troponin I (High Sensitivity)     Status: Abnormal   Collection Time: 05/22/19 10:09 PM  Result Value Ref Range   Troponin I (High Sensitivity) 84 (H) <18 ng/L    Comment: DELTA CHECK NOTED (NOTE) Elevated high sensitivity troponin I (hsTnI) values and significant  changes across serial measurements may suggest ACS but many other   chronic and acute conditions are known to elevate hsTnI results.  Refer to the Links section for chest pain algorithms and additional  guidance. Performed at [facility], [address], [place], [place] [address]     Chemistries  Recent Labs  Lab 05/22/19 1952  NA 132*  K 3.5  CL 91*  CO2 20*  GLUCOSE 371*  BUN 47*  CREATININE 1.67*  CALCIUM 8.9  AST 23  ALT 19  ALKPHOS 53  BILITOT 1.7*   ------------------------------------------------------------------------------------------------------------------  ------------------------------------------------------------------------------------------------------------------ GFR: Estimated Creatinine Clearance: 46.4 mL/min (A) (by C-G formula based on SCr of 1.67 mg/dL (H)). Liver Function Tests: Recent Labs  Lab 05/22/19 1952  AST 23  ALT 19  ALKPHOS 53  BILITOT 1.7*  PROT 7.7  ALBUMIN 3.7   No results for input(s): LIPASE, AMYLASE in the last 168 hours. No results for input(s): AMMONIA in the last 168 hours. Coagulation Profile: Recent Labs  Lab 05/22/19 1952  INR 1.2   Cardiac Enzymes: No results for input(s): CKTOTAL, CKMB, CKMBINDEX, TROPONINI in the last 168 hours. BNP (last 3 results) No results for input(s): PROBNP in the last 8760 hours. HbA1C: No results for input(s): HGBA1C in the last 72 hours. CBG: Recent Labs  Lab 05/22/19 1911  GLUCAP 357*   Lipid Profile: No results for input(s): CHOL, HDL, LDLCALC, TRIG, CHOLHDL, LDLDIRECT in the last 72 hours. Thyroid Function Tests: No results for input(s): TSH, T4TOTAL, FREET4, T3FREE, THYROIDAB in the last 72 hours. Anemia Panel: No results for input(s): VITAMINB12, FOLATE, FERRITIN, TIBC, IRON, RETICCTPCT in the last 72 hours.  --------------------------------------------------------------------------------------------------------------- Urine analysis:    Component Value Date/Time   COLORURINE YELLOW 05/22/2019 1952   APPEARANCEUR CLEAR  05/22/2019 1952   LABSPEC 1.026 05/22/2019 1952   PHURINE 5.0 05/22/2019 1952   GLUCOSEU >=500 (A) 05/22/2019 1952   HGBUR SMALL (A) 05/22/2019 1952   BILIRUBINUR NEGATIVE 05/22/2019 1952   KETONESUR 20 (A) 05/22/2019 1952   PROTEINUR 30 (A) 05/22/2019 1952   NITRITE NEGATIVE 05/22/2019 1952   LEUKOCYTESUR NEGATIVE 05/22/2019 1952      Imaging Results:    DG Chest Port 1 View  Result Date: 05/22/2019 CLINICAL DATA:  Sepsis. Fever and vomiting. EXAM: PORTABLE CHEST 1 VIEW COMPARISON:  Lung bases from abdominal CT yesterday at [facility]  FINDINGS: Post median sternotomy. Prosthetic aortic valve. Mild cardiomegaly. No focal airspace disease, large pleural effusion, or pulmonary edema. No pneumothorax. No acute osseous abnormalities are seen. IMPRESSION: Mild cardiomegaly. No acute chest findings or evidence of pneumonia. Electronically Signed   By: Keith Rake M.D.   On: 05/22/2019 20:38    My personal review of EKG: Rhythm NSR, right bundle branch block   Assessment & Plan:    Active Problems:   Sepsis ([place])   1. Sepsis-patient presented with sepsis likely from infected diabetic foot ulcer.  Patient already started on vancomycin and cefepime.  We will continue with antibiotics.  Will obtain MRI of the foot in a.m. to rule out osteomyelitis. 2. Infected foot ulcer-patient has diabetic foot ulcers, which appears to be infected as above.  MRI ordered as above.  Continue antibiotics. 3. Diabetes mellitus type 2-patient has hyperglycemia, with blood glucose 371, anion gap is 21, though CO2 is only 20.  Will repeat BMP, if continues to have anion gap.  Will start IV insulin.  In the meantime continue Lantus 13 and subcu twice daily, sliding scale insulin with NovoLog. 4. Acute kidney injury-patient has elevated BUN/creatinine, will hold chlorthalidone and ARB.  Start gentle IV hydration with normal saline.  Follow BMP in a.m. 5. Peripheral arterial disease-continue aspirin,  Plavix    DVT Prophylaxis-   Lovenox   AM Labs Ordered, also please review Full Orders  Family Communication: Admission, patients condition and plan of care including tests being ordered have been discussed with the patient and his wife at bedside who indicate understanding and agree with the plan and Code Status.  Code Status: DNR  Admission status: Inpatient: Based on patients clinical presentation and evaluation of above clinical data, I have made determination that patient meets Inpatient criteria at this time.  Time spent in minutes : 60 minutes   Mirelle Biskup S Anaiah Mcmannis M.D

## 2019-05-23 NOTE — Consult Note (Signed)
[facility]    Date of Admission:  05/22/2019           Day 1 vancomycin        Day 1 cefepime        Day 1 metronidazole       Reason for Consult: Automatic consultation for MSSA and enterococcal bacteremia   Assessment: He has polymicrobial bacteremia, most likely from a right diabetic foot infection.  I agree with changing to ampicillin sulbactam to cover MSSA, Enterococcus and anaerobes.  MRI of his right foot did not reveal any evidence of osteomyelitis or deep abscess.  I would recommend that his foot is thoroughly examined to look for evidence of infection and verify if the ulcer has newly recurred.  I will order repeat blood cultures for tomorrow morning.  Given that he has a bioprosthetic aortic valve he is at increased risk for endocarditis.  A routine TTE done on 05/11/2019 did not show any evidence of malfunctioning of the prosthesis or endocarditis.  He will need a repeat TTE.  Plan: 1. Continue ampicillin sulbactam 2. Recommend thorough foot exam 3. Repeat blood cultures in a.m. 4. TTE  Principal Problem:   Bacteremia due to methicillin susceptible Staphylococcus aureus (MSSA) Active Problems:   Enterococcal bacteremia   S/P TAVR (transcatheter aortic valve replacement)   S/P transmetatarsal amputation of foot, right (HCC)   Diabetic foot ulcer (HCC)   Hypertension   Diabetes mellitus (HCC)   Sepsis (HCC)   DKA (diabetic ketoacidoses) (HCC)   CKD (chronic kidney disease) stage 3, GFR 30-59 ml/min   CAD (coronary artery disease)   S/P CABG (coronary artery bypass graft)   PAD (peripheral artery disease) (HCC)   Dyslipidemia   Hypothyroid   Normocytic anemia   Thrombocytopenia (HCC)   Scheduled Meds: . amLODipine  10 mg Oral Daily  . aspirin  81 mg Oral Daily  . Chlorhexidine Gluconate Cloth  6 each Topical Daily  . cholecalciferol  2,000 Units Oral Daily  . clopidogrel  75 mg Oral Daily  . enoxaparin (LOVENOX) injection   40 mg Subcutaneous Q24H  . escitalopram  10 mg Oral Daily  . fenofibrate  160 mg Oral Daily  . gabapentin  1,200 mg Oral TID  . insulin glargine  30 Units Subcutaneous BID  . isosorbide mononitrate  30 mg Oral Daily  . levothyroxine  50 mcg Oral Q0600  . pantoprazole  40 mg Oral Daily  . pravastatin  40 mg Oral Daily   Continuous Infusions: . sodium chloride Stopped (05/23/19 0703)  . ampicillin-sulbactam (UNASYN) IV    . dextrose 5 % and 0.45% NaCl 75 mL/hr at 05/23/19 0704  . insulin 7 Units/hr (05/23/19 1410)  . potassium chloride 10 mEq (05/23/19 1456)  . potassium chloride     PRN Meds:.acetaminophen **OR** acetaminophen, dextrose, hydrALAZINE, metoCLOPramide, ondansetron **OR** ondansetron (ZOFRAN) IV  HPI: Larry Hart is a 74 y.o. male with multiple medical problems including diabetes, previous right transmetatarsal amputation, right diabetic foot ulcer and aortic valve replacement last year.  He has been followed by Dr. Arta Silence, podiatry, recently for a right foot ulcer.  His note dated 05/18/2019 said that a total contact cast was removed and that the right ulcer had healed.  A left ankle ulcer was noted.  Larry Hart was admitted last night after his wife brought him to the hospital stating he had been in decline for several days with  increasing weakness, nausea, vomiting and confusion.  His temperature was 103.6 degrees upon admission and the admission note indicates that there was a right foot ulcer with serosanguineous drainage.  He was started on broad empiric antibiotic therapy.  Both admission blood cultures are growing gram-positive cocci.  BCID identified MSSA and Enterococcus.   Review of Systems: Review of Systems  Unable to perform ROS: Other  Constitutional:       This is a remote consultation so no review of systems was obtained.    Past Medical History:  Diagnosis Date  . Cancer ([place])    skin  . Colon polyp    Status post hemicolectomy  . Diabetes  mellitus ([place])   . Dyslipidemia   . Dyspnea on exertion   . Hypertension   . Low serum triglycerides   . Low testosterone   . Osteomyelitis ([place])    Status post amputation  . Peripheral vascular disease ([place])   . Sinus bradycardia   . Thyroid disorder    low    Social History   Tobacco Use  . Smoking status: Never Smoker  . Smokeless tobacco: Never Used  Substance Use Topics  . Alcohol use: No    Alcohol/week: 0.0 standard drinks  . Drug use: No    Family History  Problem Relation Age of Onset  . Coronary artery disease Father 20  . Hypertension Mother   . Stroke Mother   . Diabetes Other    Allergies  Allergen Reactions  . Honey Bee Treatment [Bee Venom]   . Other Palpitations and Rash    Histalet, tears up nerves,possible rash    OBJECTIVE: Blood pressure (!) 145/68, pulse (!) 58, temperature 99 F (37.2 C), temperature source Oral, resp. rate 18, height 5\' 9"  (1.753 m), weight 99 kg, SpO2 90 %.  Physical Exam Constitutional:      Comments: This is a remote consultation so no physical exam was performed.     Lab Results Lab Results  Component Value Date   WBC 9.3 05/23/2019   HGB 11.9 (L) 05/23/2019   HCT 35.9 (L) 05/23/2019   MCV 95.7 05/23/2019   PLT 115 (L) 05/23/2019    Lab Results  Component Value Date   CREATININE 1.41 (H) 05/23/2019   BUN 54 (H) 05/23/2019   NA 132 (L) 05/23/2019   K 3.1 (L) 05/23/2019   CL 101 05/23/2019   CO2 22 05/23/2019    Lab Results  Component Value Date   ALT 21 05/23/2019   AST 34 05/23/2019   ALKPHOS 41 05/23/2019   BILITOT 0.9 05/23/2019    Sed Rate (mm/hr)  Date Value  05/23/2019 60 (H)   CRP (mg/dL)  Date Value  05/23/2019 18.0 (H)   Microbiology: Recent Results (from the past 240 hour(s))  Blood Culture (routine x 2)     Status: None (Preliminary result)   Collection Time: 05/22/19  8:42 PM   Specimen: BLOOD  Result Value Ref Range Status   Specimen Description   Final    BLOOD RIGHT  ANTECUBITAL Performed at [facility], [address], [place], [place] [address]    Special Requests   Final    BOTTLES DRAWN AEROBIC AND ANAEROBIC Blood Culture adequate volume Performed at [facility], [address], [place],  [address]    Culture  Setup Time   Final    GRAM POSITIVE COCCI IN BOTH AEROBIC AND ANAEROBIC BOTTLES Gram Stain Report Called to,Read Back By and Verified With: [facility]  B. AT 0920A ON [id number] BY THOMPSON S. CRITICAL RESULT CALLED TO, READ BACK BY AND VERIFIED WITH: PHARMD J COFFEE [id number] AT 1410 BY CM Performed at [facility], [place] [address] [place], [place] [address]    Culture PENDING  Incomplete   Report Status PENDING  Incomplete  Blood Culture ID Panel (Reflexed)     Status: Abnormal   Collection Time: 05/22/19  8:42 PM  Result Value Ref Range Status   Enterococcus species DETECTED (A) NOT DETECTED Final    Comment: CRITICAL RESULT CALLED TO, READ BACK BY AND VERIFIED WITH: PHARMD J COFFEE [id number] AT 1410 BY CM    Vancomycin resistance NOT DETECTED NOT DETECTED Final   Listeria monocytogenes NOT DETECTED NOT DETECTED Final   Staphylococcus species DETECTED (A) NOT DETECTED Final    Comment: CRITICAL RESULT CALLED TO, READ BACK BY AND VERIFIED WITH: PHARMD J COFFEE [id number] AT 1410 BY CM    Staphylococcus aureus (BCID) DETECTED (A) NOT DETECTED Final    Comment: Methicillin (oxacillin) susceptible Staphylococcus aureus (MSSA). Preferred therapy is anti staphylococcal beta lactam antibiotic (Cefazolin or Nafcillin), unless clinically contraindicated. CRITICAL RESULT CALLED TO, READ BACK BY AND VERIFIED WITH: PHARMD J COFFEE [id number] AT 77 BY CM    Methicillin resistance NOT DETECTED NOT DETECTED Final   Streptococcus species NOT DETECTED NOT DETECTED Final   Streptococcus agalactiae NOT DETECTED NOT DETECTED Final   Streptococcus pneumoniae NOT DETECTED NOT DETECTED Final   Streptococcus pyogenes NOT DETECTED NOT DETECTED Final    Acinetobacter baumannii NOT DETECTED NOT DETECTED Final   Enterobacteriaceae species NOT DETECTED NOT DETECTED Final   Enterobacter cloacae complex NOT DETECTED NOT DETECTED Final   Escherichia coli NOT DETECTED NOT DETECTED Final   Klebsiella oxytoca NOT DETECTED NOT DETECTED Final   Klebsiella pneumoniae NOT DETECTED NOT DETECTED Final   Proteus species NOT DETECTED NOT DETECTED Final   Serratia marcescens NOT DETECTED NOT DETECTED Final   Haemophilus influenzae NOT DETECTED NOT DETECTED Final   Neisseria meningitidis NOT DETECTED NOT DETECTED Final   Pseudomonas aeruginosa NOT DETECTED NOT DETECTED Final   Candida albicans NOT DETECTED NOT DETECTED Final   Candida glabrata NOT DETECTED NOT DETECTED Final   Candida krusei NOT DETECTED NOT DETECTED Final   Candida parapsilosis NOT DETECTED NOT DETECTED Final   Candida tropicalis NOT DETECTED NOT DETECTED Final    Comment: Performed at [facility], [address], [place], [place] [address]  Blood Culture (routine x 2)     Status: None (Preliminary result)   Collection Time: 05/22/19  8:46 PM   Specimen: BLOOD RIGHT HAND  Result Value Ref Range Status   Specimen Description BLOOD RIGHT HAND  Final   Special Requests   Final    BOTTLES DRAWN AEROBIC AND ANAEROBIC Blood Culture adequate volume   Culture  Setup Time   Final    GRAM POSITIVE COCCI IN BOTH AEROBIC AND ANAEROBIC BOTTLES Gram Stain Report Called to,Read Back By and Verified With: MCMILLIAN B. AT 0539J ON [id number] BY THOMPSON S. Performed at [facility], [address] [place], [place] [address]    Culture PENDING  Incomplete   Report Status PENDING  Incomplete  Respiratory Panel by RT PCR (Flu A&B, Covid) - Nasopharyngeal Swab     Status: None   Collection Time: 05/22/19  9:37 PM   Specimen: Nasopharyngeal Swab  Result Value Ref Range Status   SARS Coronavirus 2 by RT PCR NEGATIVE NEGATIVE Final  Comment: (NOTE) SARS-CoV-2 target nucleic acids are NOT  DETECTED. The SARS-CoV-2 RNA is generally detectable in upper respiratoy specimens during the acute phase of infection. The lowest concentration of SARS-CoV-2 viral copies this assay can detect is 131 copies/mL. A negative result does not preclude SARS-Cov-2 infection and should not be used as the sole basis for treatment or other patient management decisions. A negative result may occur with  improper specimen collection/handling, submission of specimen other than nasopharyngeal swab, presence of viral mutation(s) within the areas targeted by this assay, and inadequate number of viral copies (<131 copies/mL). A negative result must be combined with clinical observations, patient history, and epidemiological information. The expected result is Negative. Fact Sheet for Patients:  PinkCheek.be Fact Sheet for Healthcare Providers:  GravelBags.it This test is not yet ap proved or cleared by the [place] FDA and  has been authorized for detection and/or diagnosis of SARS-CoV-2 by FDA under an Emergency Use Authorization (EUA). This EUA will remain  in effect (meaning this test can be used) for the duration of the COVID-19 declaration under Section 564(b)(1) of the Act, 21 U.S.C. section 360bbb-3(b)(1), unless the authorization is terminated or revoked sooner.    Influenza A by PCR NEGATIVE NEGATIVE Final   Influenza B by PCR NEGATIVE NEGATIVE Final    Comment: (NOTE) The Xpert Xpress SARS-CoV-2/FLU/RSV assay is intended as an aid in  the diagnosis of influenza from Nasopharyngeal swab specimens and  should not be used as a sole basis for treatment. Nasal washings and  aspirates are unacceptable for Xpert Xpress SARS-CoV-2/FLU/RSV  testing. Fact Sheet for Patients: PinkCheek.be Fact Sheet for Healthcare Providers: GravelBags.it This test is not yet approved or cleared  by the [place] FDA and  has been authorized for detection and/or diagnosis of SARS-CoV-2 by  FDA under an Emergency Use Authorization (EUA). This EUA will remain  in effect (meaning this test can be used) for the duration of the  Covid-19 declaration under Section 564(b)(1) of the Act, 21  U.S.C. section 360bbb-3(b)(1), unless the authorization is  terminated or revoked. Performed at [facility], [address] [place], [place] [address]   MRSA PCR Screening     Status: None   Collection Time: 05/23/19  4:32 AM   Specimen: Nasal Mucosa; Nasopharyngeal  Result Value Ref Range Status   MRSA by PCR NEGATIVE NEGATIVE Final    Comment:        The GeneXpert MRSA Assay (FDA approved for NASAL specimens only), is one component of a comprehensive MRSA colonization surveillance program. It is not intended to diagnose MRSA infection nor to guide or monitor treatment for MRSA infections. Performed at [facility], [address], [place], [place] [address]     Michel Bickers, [place] for [facility] [phone] pager   [phone] cell 05/23/2019, 3:32 PM

## 2019-05-23 NOTE — Progress Notes (Signed)
Pharmacy Antibiotic Note  Larry Hart is a 74 y.o. male admitted on 05/22/2019 with sepsis.  Pharmacy has been consulted for vancomycin and cefepime dosing.  Plan: Vancomycin 2000mg  loading dose, then 750mg   IV every 12 hours.  Goal trough 15-20 mcg/mL.  Cefepime 2gm IV q12h F/U cxs and clinical progress Monitor V/S, labs, and levels as indicate  Height: 5\' 9"  (175.3 cm) Weight: 218 lb 4.8 oz (99 kg) IBW/kg (Calculated) : 70.7  Temp (24hrs), Avg:101.9 F (38.8 C), Min:99 F (37.2 C), Max:103.6 F (39.8 C)  Recent Labs  Lab 05/22/19 1952 05/22/19 2046 05/22/19 2209 05/23/19 0055 05/23/19 0232  WBC 7.2  --   --   --   --   CREATININE 1.67*  --   --  1.78* 1.73*  LATICACIDVEN  --  2.4* 2.3*  --   --     Estimated Creatinine Clearance: 44.8 mL/min (A) (by C-G formula based on SCr of 1.73 mg/dL (H)).    Allergies  Allergen Reactions  . Honey Bee Treatment [Bee Venom]   . Other Palpitations and Rash    Histalet, tears up nerves,possible rash    Antimicrobials this admission: Vancomycin 2/9 >>  Cefepime 2/9 >>  Flagyl IV x 1 dose in ED 2/9  Dose adjustments this admission: prn  Microbiology results: 2/9 BCx: pending 2/9 UCx: pending 2/9 Influenza and SARS-2 CV is negative 2/9 MRSA PCR: negative  Thank you for allowing pharmacy to be a part of this patient's care.  Isac Sarna, BS Pharm D, [place] Clinical Pharmacist Pager [phone] 05/23/2019 7:43 AM

## 2019-05-24 ENCOUNTER — Encounter (HOSPITAL_COMMUNITY): Payer: Self-pay | Admitting: Anesthesiology

## 2019-05-24 ENCOUNTER — Inpatient Hospital Stay (HOSPITAL_COMMUNITY): Payer: Medicare Other

## 2019-05-24 ENCOUNTER — Encounter (HOSPITAL_COMMUNITY): Payer: Self-pay | Admitting: Family Medicine

## 2019-05-24 DIAGNOSIS — R7881 Bacteremia: Secondary | ICD-10-CM

## 2019-05-24 LAB — BLOOD GAS, ARTERIAL
Acid-base deficit: 1.9 mmol/L (ref 0.0–2.0)
Bicarbonate: 23.3 mmol/L (ref 20.0–28.0)
FIO2: 100
O2 Saturation: 91.7 %
Patient temperature: 37.1
pCO2 arterial: 29.8 mmHg — ABNORMAL LOW (ref 32.0–48.0)
pH, Arterial: 7.467 — ABNORMAL HIGH (ref 7.350–7.450)
pO2, Arterial: 68.8 mmHg — ABNORMAL LOW (ref 83.0–108.0)

## 2019-05-24 LAB — BASIC METABOLIC PANEL
Anion gap: 6 (ref 5–15)
BUN: 49 mg/dL — ABNORMAL HIGH (ref 8–23)
CO2: 22 mmol/L (ref 22–32)
Calcium: 7.8 mg/dL — ABNORMAL LOW (ref 8.9–10.3)
Chloride: 103 mmol/L (ref 98–111)
Creatinine, Ser: 1.34 mg/dL — ABNORMAL HIGH (ref 0.61–1.24)
GFR calc Af Amer: >60 mL/min (ref >60)
GFR calc non Af Amer: 53 mL/min — ABNORMAL LOW (ref >60)
Glucose, Bld: 138 mg/dL — ABNORMAL HIGH (ref 70–99)
Potassium: 3.1 mmol/L — ABNORMAL LOW (ref 3.5–5.1)
Sodium: 131 mmol/L — ABNORMAL LOW (ref 135–145)

## 2019-05-24 LAB — ECHOCARDIOGRAM COMPLETE
Height: 69 in
Weight: 3647.29 oz

## 2019-05-24 LAB — GLUCOSE, CAPILLARY
Glucose-Capillary: 137 mg/dL — ABNORMAL HIGH (ref 70–99)
Glucose-Capillary: 147 mg/dL — ABNORMAL HIGH (ref 70–99)
Glucose-Capillary: 148 mg/dL — ABNORMAL HIGH (ref 70–99)
Glucose-Capillary: 152 mg/dL — ABNORMAL HIGH (ref 70–99)
Glucose-Capillary: 202 mg/dL — ABNORMAL HIGH (ref 70–99)
Glucose-Capillary: 223 mg/dL — ABNORMAL HIGH (ref 70–99)
Glucose-Capillary: 289 mg/dL — ABNORMAL HIGH (ref 70–99)

## 2019-05-24 LAB — CBC
HCT: 38.9 % — ABNORMAL LOW (ref 39.0–52.0)
Hemoglobin: 12.3 g/dL — ABNORMAL LOW (ref 13.0–17.0)
MCH: 30.5 pg (ref 26.0–34.0)
MCHC: 31.6 g/dL (ref 30.0–36.0)
MCV: 96.5 fL (ref 80.0–100.0)
Platelets: 123 10*3/uL — ABNORMAL LOW (ref 150–400)
RBC: 4.03 MIL/uL — ABNORMAL LOW (ref 4.22–5.81)
RDW: 13.2 % (ref 11.5–15.5)
WBC: 9.4 10*3/uL (ref 4.0–10.5)
nRBC: 0 % (ref 0.0–0.2)

## 2019-05-24 LAB — HEMOGLOBIN A1C
Hgb A1c MFr Bld: 6.7 % — ABNORMAL HIGH (ref 4.8–5.6)
Mean Plasma Glucose: 146 mg/dL

## 2019-05-24 LAB — URINE CULTURE: Culture: NO GROWTH

## 2019-05-24 LAB — BRAIN NATRIURETIC PEPTIDE: B Natriuretic Peptide: 572 pg/mL — ABNORMAL HIGH (ref 0.0–100.0)

## 2019-05-24 LAB — BETA-HYDROXYBUTYRIC ACID: Beta-Hydroxybutyric Acid: 0.13 mmol/L (ref 0.05–0.27)

## 2019-05-24 MED ORDER — INSULIN DETEMIR 100 UNIT/ML ~~LOC~~ SOLN: 10.0000 [IU] | SUBCUTANEOUS | Status: DC

## 2019-05-24 MED ORDER — INSULIN ASPART 100 UNIT/ML ~~LOC~~ SOLN
0.0000 [IU] | Freq: Three times a day (TID) | SUBCUTANEOUS | Status: DC
  Administered 2019-05-24: 2 [IU] via SUBCUTANEOUS
  Administered 2019-05-24 – 2019-05-25 (×3): 5 [IU] via SUBCUTANEOUS

## 2019-05-24 MED ORDER — BACLOFEN 10 MG PO TABS: 10.0000 mg | ORAL_TABLET | Freq: Three times a day (TID) | ORAL | Status: DC

## 2019-05-24 MED ORDER — INSULIN DETEMIR 100 UNIT/ML ~~LOC~~ SOLN
10.0000 [IU] | Freq: Once | SUBCUTANEOUS | Status: AC
  Administered 2019-05-24: 10 [IU] via SUBCUTANEOUS
  Filled 2019-05-24: qty 0.1

## 2019-05-24 MED ORDER — POTASSIUM CHLORIDE IN NACL 40-0.9 MEQ/L-% IV SOLN
INTRAVENOUS | Status: DC
  Administered 2019-05-24 (×2): 125 mL/h via INTRAVENOUS

## 2019-05-24 MED ORDER — BACLOFEN 10 MG PO TABS
10.0000 mg | ORAL_TABLET | Freq: Three times a day (TID) | ORAL | Status: DC
  Administered 2019-05-24 – 2019-05-30 (×19): 10 mg via ORAL
  Filled 2019-05-24 (×19): qty 1

## 2019-05-24 MED ORDER — FUROSEMIDE 10 MG/ML IJ SOLN
40.0000 mg | Freq: Once | INTRAMUSCULAR | Status: AC
  Administered 2019-05-24: 40 mg via INTRAVENOUS
  Filled 2019-05-24: qty 4

## 2019-05-24 MED ORDER — IPRATROPIUM-ALBUTEROL 0.5-2.5 (3) MG/3ML IN SOLN: 3.0000 mL | RESPIRATORY_TRACT | Status: DC | PRN

## 2019-05-24 MED ORDER — INSULIN ASPART 100 UNIT/ML ~~LOC~~ SOLN
4.0000 [IU] | Freq: Three times a day (TID) | SUBCUTANEOUS | Status: DC
  Administered 2019-05-24 – 2019-05-30 (×15): 4 [IU] via SUBCUTANEOUS

## 2019-05-24 MED ORDER — INSULIN ASPART 100 UNIT/ML ~~LOC~~ SOLN
0.0000 [IU] | Freq: Every day | SUBCUTANEOUS | Status: DC
  Administered 2019-05-24: 3 [IU] via SUBCUTANEOUS

## 2019-05-24 NOTE — Anesthesia Preprocedure Evaluation (Deleted)
Anesthesia Evaluation  Patient identified by MRN, date of birth, ID band Patient awake    Reviewed: Allergy & Precautions, NPO status , Patient's Chart, lab work & pertinent test results  Airway        Dental   Pulmonary           Cardiovascular hypertension, + CAD and + Peripheral Vascular Disease    1. There is a ventricular septal bounce.. Left ventricular ejection  fraction, by estimation, is 60 to 65%. The left ventricle has normal  function. The left ventrical has no regional wall motion abnormalities.  There is moderately increased left  ventricular hypertrophy. Left ventricular diastolic parameters are  indeterminate.  2. Right ventricular systolic function is mildly reduced. The right  ventricular size is mildly enlarged. There is moderately elevated  pulmonary artery systolic pressure.  3. Left atrial size was severely dilated.  4. Right atrial size was mildly dilated.  5. The mitral valve is normal in structure and function. Mild mitral  valve regurgitation. No evidence of mitral stenosis.  6. Edwards 26 mm Sapien 3 Ultra valve in the AV position. Mean gradient  slightly above reported normals for valve, slightly above prior mean  gradient of 15 mmHg from 10/18/2018 echo. Mild difference would not be  considered significant. . The aortic valve  is normal in structure and function. Aortic valve regurgitation is not  visualized.    Neuro/Psych    GI/Hepatic   Endo/Other  diabetes, Type 2, Insulin DependentHypothyroidism   Renal/GU Renal disease     Musculoskeletal   Abdominal   Peds  Hematology   Anesthesia Other Findings   Reproductive/Obstetrics                            Anesthesia Physical Anesthesia Plan Anesthesia Quick Evaluation

## 2019-05-24 NOTE — Progress Notes (Signed)
*  PRELIMINARY RESULTS* Echocardiogram 2D Echocardiogram has been performed.  Leavy Cella 05/24/2019, 11:50 AM

## 2019-05-24 NOTE — Progress Notes (Signed)
PROGRESS NOTE    Larry Hart  ION:[id number] DOB: 04/04/1946 DOA: 05/22/2019 PCP: Rory Percy, MD   Brief Narrative:  Per HPI: Larry Hart a74 y.o.male,with history of diabetes mellitus type 2, hypertension, dyslipidemia, peripheral vascular disease, diabetic foot ulcer,s/p right transmetatarsal amputation who was brought to the hospital by patient's wife with chief complaints of vomiting since Sunday, generalized weakness. Patient has also been confused intermittently, and not talking like his usual self as per wife. Patient has diabetic foot ulcer and was seen by vascular surgery, he had cast on right foot which was removed last week. At that time the ulcer was looking improved. However today in the ED patient has serosanguineous discharge from the ulcer and right foot. Patient empirically started on vancomycin and cefepime for infected diabetic foot ulcer. No history of chest pain or shortness of breath. Patient has fever of 103.6. Blood pressure is stable, he is tachypneic.  2/10: Patient has been admitted with sepsis from infected diabetic foot ulcer and was started on vancomycin and cefepime.  He is noted to have MSSA bacteremia for which he has now been transitioned to Unasyn.  2D echocardiogram has been ordered with results pending.  He remains on IV insulin drip as well as D5 fluids as beta hydroxybutyric acid remains elevated.  We will continue to monitor and likely transition to a diet in the morning once DKA pathology has resolved.  AKI is slowly improving.  Potassium is being supplemented.  We will plan to repeat blood cultures in a.m.  2/11: Appreciate ID input.  Transthoracic echocardiogram currently pending.  Repeat blood cultures pending as well.  Continue Unasyn for MSSA and enterococcal bacteremia.  We will plan to keep n.p.o. after midnight for TEE in a.m.  Patient does have history of bioprosthetic aortic valve and therefore, he is high risk for  endocarditis.  Assessment & Plan:   Principal Problem:   Bacteremia due to methicillin susceptible Staphylococcus aureus (MSSA) Active Problems:   Hypertension   Diabetes mellitus ([place])   Sepsis ([place])   DKA (diabetic ketoacidoses) (HCC)   CKD (chronic kidney disease) stage 3, GFR 30-59 ml/min   CAD (coronary artery disease)   S/P CABG (coronary artery bypass graft)   PAD (peripheral artery disease) (HCC)   Dyslipidemia   Hypothyroid   S/P TAVR (transcatheter aortic valve replacement)   S/P transmetatarsal amputation of foot, right (HCC)   Diabetic foot ulcer (HCC)   Normocytic anemia   Thrombocytopenia (HCC)   Enterococcal bacteremia   Sepsis secondary to MSSA and enterococcal bacteremia from infected diabetic foot ulcer -Patient was initially on vancomycin and cefepime, but has been transitioned to Unasyn yesterday given findings on BC ID -We will order wound cultures as well as wound care -Sepsis physiology appears to have resolved and he is stable for transfer to general medical floor at this time -Transthoracic echocardiogram without signs of endocarditis.  Will ask cardiology for TEE in a.m. LVEF 60-65%. -MRI without findings of osteomyelitis or fluid collection in need of I&D on right foot  DKA secondary to above -This has resolved and patient has been transitioned to sliding scale insulin with diet -Continue to monitor blood glucose levels closely  AKI on CKD stage III with hyperkalemia -He continues to improve and is likely near his baseline creatinine level -Continue current IV fluid with potassium supplementation and recheck in a.m. along with magnesium  Peripheral arterial disease with right transmetatarsal amputation -Continue on aspirin and Plavix  History of TAVR and bioprosthetic  aortic valve -Transthoracic echocardiogram without signs of vegetations noted.  LVEF 60-65%. -We will ask cardiology to see for TEE in a.m.  Dyslipidemia -Continue fenofibrate  and pravastatin  History of hypertension -Currently controlled -Maintain on amlodipine  Hypothyroidism -Continue levothyroxine  DVT prophylaxis: Lovenox Code Status: DNR Family Communication: None at bedside Disposition Plan: Continue on current IV antibiotics and transfer to medical floor.  2D echocardiogram without signs of endocarditis.  Will ask cardiology to see patient for TEE in a.m. given bioprosthetic aortic valve.   Consultants:   None  Procedures:   See imaging below  Antimicrobials:  Anti-infectives (From admission, onward)   Start     Dose/Rate Route Frequency Ordered Stop   05/23/19 1600  Ampicillin-Sulbactam (UNASYN) 3 g in sodium chloride 0.9 % 100 mL IVPB     3 g 200 mL/hr over 30 Minutes Intravenous Every 6 hours 05/23/19 1521     05/23/19 1000  vancomycin (VANCOREADY) IVPB 750 mg/150 mL  Status:  Discontinued     750 mg 150 mL/hr over 60 Minutes Intravenous Every 12 hours 05/23/19 0741 05/23/19 0757   05/23/19 0900  ceFEPIme (MAXIPIME) 2 g in sodium chloride 0.9 % 100 mL IVPB  Status:  Discontinued     2 g 200 mL/hr over 30 Minutes Intravenous Every 12 hours 05/23/19 0741 05/23/19 1521   05/22/19 2030  ceFEPIme (MAXIPIME) 2 g in sodium chloride 0.9 % 100 mL IVPB     2 g 200 mL/hr over 30 Minutes Intravenous  Once 05/22/19 2011 05/22/19 2156   05/22/19 2030  vancomycin (VANCOREADY) IVPB 2000 mg/400 mL     2,000 mg 200 mL/hr over 120 Minutes Intravenous  Once 05/22/19 2026 05/23/19 0013   05/22/19 2015  metroNIDAZOLE (FLAGYL) IVPB 500 mg     500 mg 100 mL/hr over 60 Minutes Intravenous  Once 05/22/19 2011 05/22/19 2207   05/22/19 2015  vancomycin (VANCOCIN) IVPB 1000 mg/200 mL premix  Status:  Discontinued     1,000 mg 200 mL/hr over 60 Minutes Intravenous  Once 05/22/19 2011 05/22/19 2026       Subjective: Patient seen and evaluated today with no new acute complaints or concerns. No acute concerns or events noted overnight.  He is overall feeling  much better and is able to tolerate diet.  He has been transitioned to sliding scale insulin overnight.  He has remained afebrile.  Objective: Vitals:   05/24/19 0800 05/24/19 0900 05/24/19 1000 05/24/19 1157  BP: 136/77  115/64 131/66  Pulse: 64 65 68 65  Resp: 16 (!) 22    Temp:    99.5 F (37.5 C)  TempSrc:    Oral  SpO2: 96% 90% 91% 91%  Weight:      Height:        Intake/Output Summary (Last 24 hours) at 05/24/2019 1418 Last data filed at 05/24/2019 1300 Gross per 24 hour  Intake 3932.55 ml  Output 1100 ml  Net 2832.55 ml   Filed Weights   05/22/19 2023 05/24/19 0500  Weight: 99 kg 103.4 kg    Examination:  General exam: Appears calm and comfortable  Respiratory system: Clear to auscultation. Respiratory effort normal. Cardiovascular system: S1 & S2 heard, RRR. No JVD, murmurs, rubs, gallops or clicks. No pedal edema. Gastrointestinal system: Abdomen is nondistended, soft and nontender. No organomegaly or masses felt. Normal bowel sounds heard. Central nervous system: Alert and oriented. No focal neurological deficits. Extremities: Symmetric 5 x 5 power.  Right transmetatarsal amputation with  wound draining yellowish exudate. Skin: No rashes, lesions or ulcers Psychiatry: Judgement and insight appear normal. Mood & affect appropriate.     Data Reviewed: I have personally reviewed following labs and imaging studies  CBC: Recent Labs  Lab 05/22/19 1952 05/23/19 0742 05/24/19 0434  WBC 7.2 9.3 9.4  HGB 13.5 11.9* 12.3*  HCT 40.7 35.9* 38.9*  MCV 94.7 95.7 96.5  PLT 119* 115* 182*   Basic Metabolic Panel: Recent Labs  Lab 05/23/19 0742 05/23/19 1047 05/23/19 1328 05/23/19 1822 05/24/19 0434  NA 134* 134* 132* 135 131*  K 2.9* 3.0* 3.1* 3.0* 3.1*  CL 100 101 101 103 103  CO2 21* 20* 22 22 22   GLUCOSE 206* 217* 188* 141* 138*  BUN 51* 52* 54* 52* 49*  CREATININE 1.59* 1.56* 1.41* 1.35* 1.34*  CALCIUM 8.0* 7.9* 7.7* 8.0* 7.8*  MG  --  2.2  --   --    --    GFR: Estimated Creatinine Clearance: 59.1 mL/min (A) (by C-G formula based on SCr of 1.34 mg/dL (H)). Liver Function Tests: Recent Labs  Lab 05/22/19 1952 05/23/19 0742  AST 23 34  ALT 19 21  ALKPHOS 53 41  BILITOT 1.7* 0.9  PROT 7.7 6.5  ALBUMIN 3.7 3.2*   No results for input(s): LIPASE, AMYLASE in the last 168 hours. No results for input(s): AMMONIA in the last 168 hours. Coagulation Profile: Recent Labs  Lab 05/22/19 1952  INR 1.2   Cardiac Enzymes: No results for input(s): CKTOTAL, CKMB, CKMBINDEX, TROPONINI in the last 168 hours. BNP (last 3 results) No results for input(s): PROBNP in the last 8760 hours. HbA1C: Recent Labs    05/22/19 2042  HGBA1C 6.7*   CBG: Recent Labs  Lab 05/24/19 0048 05/24/19 0223 05/24/19 0444 05/24/19 0743 05/24/19 1149  GLUCAP 148* 152* 137* 147* 202*   Lipid Profile: No results for input(s): CHOL, HDL, LDLCALC, TRIG, CHOLHDL, LDLDIRECT in the last 72 hours. Thyroid Function Tests: No results for input(s): TSH, T4TOTAL, FREET4, T3FREE, THYROIDAB in the last 72 hours. Anemia Panel: No results for input(s): VITAMINB12, FOLATE, FERRITIN, TIBC, IRON, RETICCTPCT in the last 72 hours. Sepsis Labs: Recent Labs  Lab 05/22/19 2046 05/22/19 2209  LATICACIDVEN 2.4* 2.3*    Recent Results (from the past 240 hour(s))  Urine culture     Status: None   Collection Time: 05/22/19  7:52 PM   Specimen: In/Out Cath Urine  Result Value Ref Range Status   Specimen Description   Final    IN/OUT CATH URINE Performed at [facility], [address], [place], [place] [address]    Special Requests   Final    NONE Performed at [facility], [address], [place], [place] [address]    Culture   Final    NO GROWTH Performed at [facility], [place] [address] [place], [place] [address]    Report Status 05/24/2019 FINAL  Final  Blood Culture (routine x 2)     Status: Abnormal (Preliminary result)   Collection Time: 05/22/19  8:42  PM   Specimen: BLOOD  Result Value Ref Range Status   Specimen Description   Final    BLOOD RIGHT ANTECUBITAL Performed at [facility], [address] [place], [place] [address]    Special Requests   Final    BOTTLES DRAWN AEROBIC AND ANAEROBIC Blood Culture adequate volume Performed at [facility], [address] [place], [place] [address]    Culture  Setup Time  Final    GRAM POSITIVE COCCI IN BOTH AEROBIC AND ANAEROBIC BOTTLES Gram Stain Report Called to,Read Back By and Verified With: MCMILLIAN B. AT 0920A ON [id number] BY THOMPSON S. CRITICAL RESULT CALLED TO, READ BACK BY AND VERIFIED WITH: PHARMD J COFFEE [id number] AT 1410 BY CM Performed at [facility], [place] [address], [place], [place] [address]    Culture STAPHYLOCOCCUS AUREUS ENTEROCOCCUS FAECALIS  (A)  Final   Report Status PENDING  Incomplete  Blood Culture ID Panel (Reflexed)     Status: Abnormal   Collection Time: 05/22/19  8:42 PM  Result Value Ref Range Status   Enterococcus species DETECTED (A) NOT DETECTED Final    Comment: CRITICAL RESULT CALLED TO, READ BACK BY AND VERIFIED WITH: PHARMD J COFFEE [id number] AT 1410 BY CM    Vancomycin resistance NOT DETECTED NOT DETECTED Final   Listeria monocytogenes NOT DETECTED NOT DETECTED Final   Staphylococcus species DETECTED (A) NOT DETECTED Final    Comment: CRITICAL RESULT CALLED TO, READ BACK BY AND VERIFIED WITH: PHARMD J COFFEE [id number] AT 1410 BY CM    Staphylococcus aureus (BCID) DETECTED (A) NOT DETECTED Final    Comment: Methicillin (oxacillin) susceptible Staphylococcus aureus (MSSA). Preferred therapy is anti staphylococcal beta lactam antibiotic (Cefazolin or Nafcillin), unless clinically contraindicated. CRITICAL RESULT CALLED TO, READ BACK BY AND VERIFIED WITH: PHARMD J COFFEE [id number] AT 33 BY CM    Methicillin resistance NOT DETECTED NOT DETECTED Final   Streptococcus species NOT DETECTED NOT DETECTED Final   Streptococcus agalactiae NOT DETECTED NOT  DETECTED Final   Streptococcus pneumoniae NOT DETECTED NOT DETECTED Final   Streptococcus pyogenes NOT DETECTED NOT DETECTED Final   Acinetobacter baumannii NOT DETECTED NOT DETECTED Final   Enterobacteriaceae species NOT DETECTED NOT DETECTED Final   Enterobacter cloacae complex NOT DETECTED NOT DETECTED Final   Escherichia coli NOT DETECTED NOT DETECTED Final   Klebsiella oxytoca NOT DETECTED NOT DETECTED Final   Klebsiella pneumoniae NOT DETECTED NOT DETECTED Final   Proteus species NOT DETECTED NOT DETECTED Final   Serratia marcescens NOT DETECTED NOT DETECTED Final   Haemophilus influenzae NOT DETECTED NOT DETECTED Final   Neisseria meningitidis NOT DETECTED NOT DETECTED Final   Pseudomonas aeruginosa NOT DETECTED NOT DETECTED Final   Candida albicans NOT DETECTED NOT DETECTED Final   Candida glabrata NOT DETECTED NOT DETECTED Final   Candida krusei NOT DETECTED NOT DETECTED Final   Candida parapsilosis NOT DETECTED NOT DETECTED Final   Candida tropicalis NOT DETECTED NOT DETECTED Final    Comment: Performed at [facility], [address] [place], [place] [address]  Blood Culture (routine x 2)     Status: Abnormal (Preliminary result)   Collection Time: 05/22/19  8:46 PM   Specimen: BLOOD RIGHT HAND  Result Value Ref Range Status   Specimen Description   Final    BLOOD RIGHT HAND Performed at [facility], [address], [place], [place] [address]    Special Requests   Final    BOTTLES DRAWN AEROBIC AND ANAEROBIC Blood Culture adequate volume Performed at [place] [place], [place] [address]    Culture  Setup Time   Final    GRAM POSITIVE COCCI IN BOTH AEROBIC AND ANAEROBIC BOTTLES Gram Stain Report Called to,Read Back By and Verified With: MCMILLIAN B. AT 0920A ON [id number] BY THOMPSON S. Performed at [facility], [address], [place], [place] [address]    Culture STAPHYLOCOCCUS AUREUS (A)  Final   Report Status PENDING  Incomplete  Respiratory  Panel by RT PCR (Flu A&B, Covid) - Nasopharyngeal Swab     Status: None   Collection Time: 05/22/19  9:37 PM   Specimen: Nasopharyngeal Swab  Result Value Ref Range Status   SARS Coronavirus 2 by RT PCR NEGATIVE NEGATIVE Final    Comment: (NOTE) SARS-CoV-2 target nucleic acids are NOT DETECTED. The SARS-CoV-2 RNA is generally detectable in upper respiratoy specimens during the acute phase of infection. The lowest concentration of SARS-CoV-2 viral copies this assay can detect is 131 copies/mL. A negative result does not preclude SARS-Cov-2 infection and should not be used as the sole basis for treatment or other patient management decisions. A negative result may occur with  improper specimen collection/handling, submission of specimen other than nasopharyngeal swab, presence of viral mutation(s) within the areas targeted by this assay, and inadequate number of viral copies (<131 copies/mL). A negative result must be combined with clinical observations, patient history, and epidemiological information. The expected result is Negative. Fact Sheet for Patients:  PinkCheek.be Fact Sheet for Healthcare Providers:  GravelBags.it This test is not yet ap proved or cleared by the [place] FDA and  has been authorized for detection and/or diagnosis of SARS-CoV-2 by FDA under an Emergency Use Authorization (EUA). This EUA will remain  in effect (meaning this test can be used) for the duration of the COVID-19 declaration under Section 564(b)(1) of the Act, 21 U.S.C. section 360bbb-3(b)(1), unless the authorization is terminated or revoked sooner.    Influenza A by PCR NEGATIVE NEGATIVE Final   Influenza B by PCR NEGATIVE NEGATIVE Final    Comment: (NOTE) The Xpert Xpress SARS-CoV-2/FLU/RSV assay is intended as an aid in  the diagnosis of influenza from Nasopharyngeal swab specimens and  should not be used as a sole basis for  treatment. Nasal washings and  aspirates are unacceptable for Xpert Xpress SARS-CoV-2/FLU/RSV  testing. Fact Sheet for Patients: PinkCheek.be Fact Sheet for Healthcare Providers: GravelBags.it This test is not yet approved or cleared by the [place] FDA and  has been authorized for detection and/or diagnosis of SARS-CoV-2 by  FDA under an Emergency Use Authorization (EUA). This EUA will remain  in effect (meaning this test can be used) for the duration of the  Covid-19 declaration under Section 564(b)(1) of the Act, 21  U.S.C. section 360bbb-3(b)(1), unless the authorization is  terminated or revoked. Performed at [facility], [address] [place], [place] [address]   MRSA PCR Screening     Status: None   Collection Time: 05/23/19  4:32 AM   Specimen: Nasal Mucosa; Nasopharyngeal  Result Value Ref Range Status   MRSA by PCR NEGATIVE NEGATIVE Final    Comment:        The GeneXpert MRSA Assay (FDA approved for NASAL specimens only), is one component of a comprehensive MRSA colonization surveillance program. It is not intended to diagnose MRSA infection nor to guide or monitor treatment for MRSA infections. Performed at [facility], [address] [place], [place] [address]   Culture, blood (routine x 2)     Status: None (Preliminary result)   Collection Time: 05/24/19  7:30 AM   Specimen: BLOOD LEFT HAND  Result Value Ref Range Status   Specimen Description BLOOD LEFT HAND  Final   Special Requests   Final    BOTTLES DRAWN AEROBIC AND ANAEROBIC Blood Culture results may not be optimal due to an inadequate volume of blood received in  culture bottles Performed at [facility], [address] [place], [place] [address]    Culture PENDING  Incomplete   Report Status PENDING  Incomplete  Culture, blood (routine x 2)     Status: None (Preliminary result)   Collection Time: 05/24/19  7:30 AM   Specimen: BLOOD RIGHT  HAND  Result Value Ref Range Status   Specimen Description BLOOD RIGHT HAND  Final   Special Requests   Final    BOTTLES DRAWN AEROBIC AND ANAEROBIC Blood Culture results may not be optimal due to an inadequate volume of blood received in culture bottles Performed at [facility], [address] [place], [place] [address]    Culture PENDING  Incomplete   Report Status PENDING  Incomplete         Radiology Studies: MR FOOT RIGHT WO CONTRAST  Result Date: 05/23/2019 CLINICAL DATA:  Osteomyelitis of the right foot. Serosanguineous just charge from the ulcer of the right foot. EXAM: MRI OF THE RIGHT FOREFOOT WITHOUT CONTRAST TECHNIQUE: Multiplanar, multisequence MR imaging of the right forefoot was performed. No intravenous contrast was administered. COMPARISON:  None. FINDINGS: Bones/Joint/Cartilage Prior transmetatarsal amputation. No fracture or dislocation. Normal alignment. No joint effusion. Ligaments, Muscles and Tendons Flexor, peroneal and extensor compartment tendons are intact. Generalized muscle atrophy. Soft tissue No fluid collection or hematoma. No soft tissue mass. Soft tissue edema along the dorsal aspect of the foot which may be reactive versus secondary to mild cellulitis. IMPRESSION: 1. Prior transmetatarsal amputation. No osteomyelitis of the right foot. Electronically Signed   By: Kathreen Devoid   On: 05/23/2019 09:29   DG Chest Port 1 View  Result Date: 05/22/2019 CLINICAL DATA:  Sepsis. Fever and vomiting. EXAM: PORTABLE CHEST 1 VIEW COMPARISON:  Lung bases from abdominal CT yesterday at [facility]: Post median sternotomy. Prosthetic aortic valve. Mild cardiomegaly. No focal airspace disease, large pleural effusion, or pulmonary edema. No pneumothorax. No acute osseous abnormalities are seen. IMPRESSION: Mild cardiomegaly. No acute chest findings or evidence of pneumonia. Electronically Signed   By: Keith Rake M.D.   On: 05/22/2019 20:38   ECHOCARDIOGRAM  COMPLETE  Result Date: 05/24/2019    ECHOCARDIOGRAM REPORT   Patient Name:   BOB DAVERSA Date of Exam: 05/24/2019 Medical Rec #:  [id number]       Height:       69.0 in Accession #:    [id number]      Weight:       228.0 lb Date of Birth:  1946-04-09       BSA:          2.18 m Patient Age:    69 years        BP:           115/64 mmHg Patient Gender: M               HR:           68 bpm. Exam Location:  [facility] Procedure: 2D Echo Indications:    Bacteremia 790.7 / R78.81  History:        Patient has prior history of Echocardiogram examinations, most                 recent 01/19/2012. CAD, Prior CABG, Signs/Symptoms:Bacteremia;                 Risk Factors:Dyslipidemia, Hypertension, Diabetes and  Non-Smoker. S/P TAVR (transcatheter aortic valve replacement.  Sonographer:    Leavy Cella RDCS (AE) Referring Phys: [phone] Royanne Foots [place]  1. There is a ventricular septal bounce.. Left ventricular ejection fraction, by estimation, is 60 to 65%. The left ventricle has normal function. The left ventrical has no regional wall motion abnormalities. There is moderately increased left ventricular hypertrophy. Left ventricular diastolic parameters are indeterminate.  2. Right ventricular systolic function is mildly reduced. The right ventricular size is mildly enlarged. There is moderately elevated pulmonary artery systolic pressure.  3. Left atrial size was severely dilated.  4. Right atrial size was mildly dilated.  5. The mitral valve is normal in structure and function. Mild mitral valve regurgitation. No evidence of mitral stenosis.  6. Edwards 26 mm Sapien 3 Ultra valve in the AV position. Mean gradient slightly above reported normals for valve, slightly above prior mean gradient of 15 mmHg from 10/18/2018 echo. Mild difference would not be considered significant. . The aortic valve is normal in structure and function. Aortic valve regurgitation is not visualized. FINDINGS  Left Ventricle:  There is a ventricular septal bounce. Left ventricular ejection fraction, by estimation, is 60 to 65%. The left ventricle has normal function. The left ventricle has no regional wall motion abnormalities. There is moderately increased left ventricular hypertrophy. Left ventricular diastolic parameters are indeterminate. Right Ventricle: The right ventricular size is mildly enlarged. Right vetricular wall thickness was not assessed. Right ventricular systolic function is mildly reduced. There is moderately elevated pulmonary artery systolic pressure. The tricuspid regurgitant velocity is 2.85 m/s, and with an assumed right atrial pressure of 10 mmHg, the estimated right ventricular systolic pressure is 09.9 mmHg. Left Atrium: Left atrial size was severely dilated. Right Atrium: Right atrial size was mildly dilated. Pericardium: There is no evidence of pericardial effusion. Mitral Valve: The mitral valve is normal in structure and function. Mild mitral valve regurgitation. No evidence of mitral valve stenosis. Tricuspid Valve: The tricuspid valve is normal in structure. Tricuspid valve regurgitation is mild . No evidence of tricuspid stenosis. Aortic Valve: Edwards 26 mm Sapien 3 Ultra valve in the AV position. Mean gradient slightly above reported normals for valve, slightly above prior mean gradient of 15 mmHg from 10/18/2018 echo. Mild difference would not be considered significant. The aortic valve is normal in structure and function. Aortic valve regurgitation is not visualized. Aortic valve mean gradient measures 17.8 mmHg. Aortic valve peak gradient measures 31.6 mmHg. Pulmonic Valve: The pulmonic valve was not well visualized. Pulmonic valve regurgitation is not visualized. No evidence of pulmonic stenosis. Aorta: The aortic root is normal in size and structure. Pulmonary Artery: Indeterminate PASP, poorly visualized IVC. Venous: The inferior vena cava was not well visualized. IAS/Shunts: No atrial level shunt  detected by color flow Doppler.  LEFT VENTRICLE PLAX 2D LVIDd:         4.24 cm Diastology LVIDs:         2.85 cm LV e' lateral:   7.83 cm/s LV PW:         1.20 cm LV E/e' lateral: 12.0 LV IVS:        1.17 cm LV e' medial:    5.98 cm/s LV SV Index:   21.71   LV E/e' medial:  15.7  RIGHT VENTRICLE RV S prime:     9.68 cm/s TAPSE (M-mode): 1.1 cm LEFT ATRIUM           Index       RIGHT ATRIUM  Index LA diam:      4.30 cm 1.97 cm/m  RA Area:     18.90 cm LA Vol (A2C): 86.8 ml 39.74 ml/m RA Volume:   50.30 ml  23.03 ml/m LA Vol (A4C): 97.1 ml 44.46 ml/m  AORTIC VALVE AV Vmax:           280.89 cm/s AV Vmean:          199.593 cm/s AV VTI:            0.595 m AV Peak Grad:      31.6 mmHg AV Mean Grad:      17.8 mmHg LVOT Vmax:         79.67 cm/s LVOT Vmean:        56.372 cm/s LVOT VTI:          0.156 m LVOT/AV VTI ratio: 0.26  AORTA Ao Root diam: 3.20 cm MITRAL VALVE                        TRICUSPID VALVE MV Area (PHT): 3.03 cm             TR Peak grad:   32.5 mmHg MV Decel Time: 250 msec             TR Vmax:        285.00 cm/s MV E velocity: 93.60 cm/s 103 cm/s MV A velocity: 34.00 cm/s 70.3 cm/s SHUNTS MV E/A ratio:  2.75       1.5       Systemic VTI: 0.16 m Carlyle Dolly MD Electronically signed by Carlyle Dolly MD Signature Date/Time: 05/24/2019/2:18:21 PM    Final         Scheduled Meds: . amLODipine  10 mg Oral Daily  . aspirin  81 mg Oral Daily  . baclofen  10 mg Oral TID  . Chlorhexidine Gluconate Cloth  6 each Topical Daily  . cholecalciferol  2,000 Units Oral Daily  . clopidogrel  75 mg Oral Daily  . enoxaparin (LOVENOX) injection  40 mg Subcutaneous Q24H  . escitalopram  10 mg Oral Daily  . fenofibrate  160 mg Oral Daily  . gabapentin  1,200 mg Oral TID  . insulin aspart  0-15 Units Subcutaneous TID WC  . insulin aspart  0-5 Units Subcutaneous QHS  . insulin aspart  4 Units Subcutaneous TID WC  . insulin glargine  30 Units Subcutaneous BID  . isosorbide mononitrate  30 mg  Oral Daily  . levothyroxine  50 mcg Oral Q0600  . pantoprazole  40 mg Oral Daily  . pravastatin  40 mg Oral Daily   Continuous Infusions: . sodium chloride Stopped (05/23/19 0703)  . 0.9 % NaCl with KCl 40 mEq / L 125 mL/hr at 05/24/19 1300  . ampicillin-sulbactam (UNASYN) IV Stopped (05/24/19 1034)  . insulin Stopped (05/24/19 0721)     LOS: 1 day    Time spent: 30 minutes    Keddrick Wyne Darleen Crocker, DO [facility] Pager [phone]  If 7PM-7AM, please contact night-coverage [email or web address] Password [facility] 05/24/2019, 2:18 PM

## 2019-05-24 NOTE — Plan of Care (Signed)
  Problem: Education: Goal: Ability to describe self-care measures that may prevent or decrease complications (Diabetes Survival Skills Education) will improve Outcome: Progressing Goal: Individualized Educational Video(s) Outcome: Progressing   Problem: Cardiac: Goal: Ability to maintain an adequate cardiac output will improve Outcome: Progressing   Problem: Health Behavior/Discharge Planning: Goal: Ability to identify and utilize available resources and services will improve Outcome: Progressing Goal: Ability to manage health-related needs will improve Outcome: Progressing   Problem: Fluid Volume: Goal: Ability to achieve a balanced intake and output will improve Outcome: Progressing   Problem: Metabolic: Goal: Ability to maintain appropriate glucose levels will improve Outcome: Progressing   Problem: Nutritional: Goal: Maintenance of adequate nutrition will improve Outcome: Progressing Goal: Maintenance of adequate weight for body size and type will improve Outcome: Progressing   Problem: Respiratory: Goal: Will regain and/or maintain adequate ventilation Outcome: Progressing   Problem: Urinary Elimination: Goal: Ability to achieve and maintain adequate renal perfusion and functioning will improve Outcome: Progressing   Problem: Health Behavior/Discharge Planning: Goal: Ability to manage health-related needs will improve Outcome: Progressing   Problem: Clinical Measurements: Goal: Ability to maintain clinical measurements within normal limits will improve Outcome: Progressing Goal: Will remain free from infection Outcome: Progressing Goal: Diagnostic test results will improve Outcome: Progressing Goal: Respiratory complications will improve Outcome: Progressing Goal: Cardiovascular complication will be avoided Outcome: Progressing   Problem: Activity: Goal: Risk for activity intolerance will decrease Outcome: Progressing   Problem: Nutrition: Goal:  Adequate nutrition will be maintained Outcome: Progressing   Problem: Coping: Goal: Level of anxiety will decrease Outcome: Progressing   Problem: Elimination: Goal: Will not experience complications related to bowel motility Outcome: Progressing Goal: Will not experience complications related to urinary retention Outcome: Progressing   Problem: Elimination: Goal: Will not experience complications related to urinary retention Outcome: Progressing   Problem: Pain Managment: Goal: General experience of comfort will improve Outcome: Progressing   Problem: Safety: Goal: Ability to remain free from injury will improve Outcome: Progressing   Problem: Skin Integrity: Goal: Risk for impaired skin integrity will decrease Outcome: Progressing

## 2019-05-24 NOTE — Progress Notes (Signed)
Condom cath off and bed very wet.  During bed change patient talking and 02 sat 95% on nonrebreather.

## 2019-05-24 NOTE — Progress Notes (Signed)
Patient transitioned from nonrebreather to 10L [place] at 2000. O2 sat 85% at that time. Respiratory notified. Patient placed on 14L HFNC, O2 Sat increased to 89%. Patient resting in bed at this time, respirations 22. Patient denies shortness of breath. MD notified. No new orders.

## 2019-05-24 NOTE — Progress Notes (Signed)
Patient ID: Larry Hart, male   DOB: December 16, 1945, 74 y.o.   MRN: [id number]          [facility]    Date of Admission:  05/22/2019    Total days of antibiotics 3        Day 2 ampicillin sulbactam  [name] has MSSA and enterococcal bacteremia probably complicating a foot ulcer.  His fever is coming down.  Repeat blood cultures and TTE are pending.  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  I recommend continuing ampicillin sulbactam for now.         Michel Bickers, MD [facility] for [facility]ious [facility] [phone] pager   [phone] cell 05/24/2019, 2:07 PM

## 2019-05-24 NOTE — Progress Notes (Signed)
Developed SOB, resp were 30 and 02 sat 78 on room air.  6 liters bought to 80's and nonrebreather brought to 95%. Contacted Dr. Manuella Ghazi and he will order cxr, lasix, labs and order to dc fluids.

## 2019-05-24 NOTE — Progress Notes (Signed)
Received consult for TEE for patient in setting of bacteremia and known prosthetic aortic valve. Scheduling desk closed currently, have left a message to schedule for tomorrow with anesthesia given patients history of OSA and some nocturnal hypoxia during admission. Will make npo and place order today, finalize time tomorrow AM   Carlyle Dolly MD

## 2019-05-25 ENCOUNTER — Encounter (HOSPITAL_COMMUNITY)
Admission: EM | Disposition: A | Discharge status: Home Health | Payer: Self-pay | Source: Home / Self Care | Attending: Internal Medicine

## 2019-05-25 ENCOUNTER — Other Ambulatory Visit (HOSPITAL_COMMUNITY): Payer: PRIVATE HEALTH INSURANCE

## 2019-05-25 LAB — MAGNESIUM: Magnesium: 2.1 mg/dL (ref 1.7–2.4)

## 2019-05-25 LAB — BASIC METABOLIC PANEL
Anion gap: 11 (ref 5–15)
BUN: 39 mg/dL — ABNORMAL HIGH (ref 8–23)
CO2: 25 mmol/L (ref 22–32)
Calcium: 8.1 mg/dL — ABNORMAL LOW (ref 8.9–10.3)
Chloride: 99 mmol/L (ref 98–111)
Creatinine, Ser: 1.35 mg/dL — ABNORMAL HIGH (ref 0.61–1.24)
GFR calc Af Amer: >60 mL/min (ref >60)
GFR calc non Af Amer: 52 mL/min — ABNORMAL LOW (ref >60)
Glucose, Bld: 263 mg/dL — ABNORMAL HIGH (ref 70–99)
Potassium: 3.3 mmol/L — ABNORMAL LOW (ref 3.5–5.1)
Sodium: 135 mmol/L (ref 135–145)

## 2019-05-25 LAB — CBC
HCT: 37 % — ABNORMAL LOW (ref 39.0–52.0)
Hemoglobin: 12.3 g/dL — ABNORMAL LOW (ref 13.0–17.0)
MCH: 30.5 pg (ref 26.0–34.0)
MCHC: 33.2 g/dL (ref 30.0–36.0)
MCV: 91.8 fL (ref 80.0–100.0)
Platelets: 151 10*3/uL (ref 150–400)
RBC: 4.03 MIL/uL — ABNORMAL LOW (ref 4.22–5.81)
RDW: 12.9 % (ref 11.5–15.5)
WBC: 10 10*3/uL (ref 4.0–10.5)
nRBC: 0 % (ref 0.0–0.2)

## 2019-05-25 LAB — GLUCOSE, CAPILLARY
Glucose-Capillary: 213 mg/dL — ABNORMAL HIGH (ref 70–99)
Glucose-Capillary: 201 mg/dL — ABNORMAL HIGH (ref 70–99)
Glucose-Capillary: 222 mg/dL — ABNORMAL HIGH (ref 70–99)
Glucose-Capillary: 226 mg/dL — ABNORMAL HIGH (ref 70–99)

## 2019-05-25 SURGERY — ECHOCARDIOGRAM, TRANSESOPHAGEAL
Anesthesia: Monitor Anesthesia Care

## 2019-05-25 MED ORDER — FUROSEMIDE 10 MG/ML IJ SOLN
40.0000 mg | Freq: Two times a day (BID) | INTRAMUSCULAR | Status: DC
  Administered 2019-05-25 – 2019-05-26 (×3): 40 mg via INTRAVENOUS
  Filled 2019-05-25 (×3): qty 4

## 2019-05-25 MED ORDER — INSULIN ASPART 100 UNIT/ML ~~LOC~~ SOLN
0.0000 [IU] | Freq: Every day | SUBCUTANEOUS | Status: DC
  Administered 2019-05-25: 3 [IU] via SUBCUTANEOUS
  Administered 2019-05-26 – 2019-05-28 (×2): 2 [IU] via SUBCUTANEOUS

## 2019-05-25 MED ORDER — POTASSIUM CHLORIDE CRYS ER 20 MEQ PO TBCR
40.0000 meq | EXTENDED_RELEASE_TABLET | Freq: Two times a day (BID) | ORAL | Status: AC
  Administered 2019-05-25 (×2): 40 meq via ORAL
  Filled 2019-05-25 (×2): qty 2

## 2019-05-25 MED ORDER — FUROSEMIDE 10 MG/ML IJ SOLN
40.0000 mg | Freq: Once | INTRAMUSCULAR | Status: AC
  Administered 2019-05-25: 40 mg via INTRAVENOUS
  Filled 2019-05-25: qty 4

## 2019-05-25 MED ORDER — INSULIN ASPART 100 UNIT/ML ~~LOC~~ SOLN
0.0000 [IU] | Freq: Three times a day (TID) | SUBCUTANEOUS | Status: DC
  Administered 2019-05-25 (×2): 7 [IU] via SUBCUTANEOUS
  Administered 2019-05-26: 3 [IU] via SUBCUTANEOUS
  Administered 2019-05-26: 7 [IU] via SUBCUTANEOUS
  Administered 2019-05-26 – 2019-05-27 (×3): 4 [IU] via SUBCUTANEOUS
  Administered 2019-05-27: 3 [IU] via SUBCUTANEOUS
  Administered 2019-05-29 (×2): 4 [IU] via SUBCUTANEOUS
  Administered 2019-05-29: 7 [IU] via SUBCUTANEOUS
  Administered 2019-05-30: 4 [IU] via SUBCUTANEOUS
  Administered 2019-05-30: 3 [IU] via SUBCUTANEOUS

## 2019-05-25 MED ORDER — POTASSIUM CHLORIDE CRYS ER 20 MEQ PO TBCR: 40.0000 meq | EXTENDED_RELEASE_TABLET | Freq: Once | ORAL | Status: DC

## 2019-05-25 NOTE — Progress Notes (Signed)
Discussed with primary team. Patient with acute respiratory distress last night, flash pulmonary edema. Improving but remains on high O2 demand. We will cancel TEE, Dr Manuella Ghazi made aware. I will make Monday rounding team aware to evaluate patient and see if ok for procedure on Monday, we had planned TEE with anesthesia for bacteremia, anesthesia was requested due to his history of OSA and noctunal hypoxia.   Larry Dolly MD

## 2019-05-25 NOTE — Progress Notes (Signed)
Patient ID: Larry Hart, male   DOB: [birth year], 74 y.o.   MRN: [id number]          [facility] for Infect[facility] Disease    Date of Admission:  05/22/2019    Total days of antibiotics 4        Day 3 ampicillin sulbactam  Mr. 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  Gram stain of right foot drainage shows gram-positive cocci, gram-positive rods and gram-negative rods.  Wound drainage cultures are pending.  Repeat blood cultures are negative at a little over 24 hours.  There was no evidence of endocarditis seen on TTE.  TEE was planned for today but he developed flash pulmonary edema and hypoxia overnight so that has been postponed until early next week.  I plan on continuing ampicillin sulbactam pending final cultures and TEE results.  I will follow-up on Monday, 05/28/2019.         Michel Bickers, MD [facility] for Infectious [facility] [phone] pager   [phone] cell 05/25/2019, 11:46 AM

## 2019-05-25 NOTE — Progress Notes (Signed)
PROGRESS NOTE    Larry Hart  BMW:[id number] DOB: 01/17/1946 DOA: 05/22/2019 PCP: Rory Percy, MD   Brief Narrative:  Per HPI: Larry Hart a73 y.o.male,with history of diabetes mellitus type 2, hypertension, dyslipidemia, peripheral vascular disease, diabetic foot ulcer,s/p right transmetatarsal amputation who was brought to the hospital by patient's wife with chief complaints of vomiting since Sunday, generalized weakness. Patient has also been confused intermittently, and not talking like his usual self as per wife. Patient has diabetic foot ulcer and was seen by vascular surgery, he had cast on right foot which was removed last week. At that time the ulcer was looking improved. However today in the ED patient has serosanguineous discharge from the ulcer and right foot. Patient empirically started on vancomycin and cefepime for infected diabetic foot ulcer. No history of chest pain or shortness of breath. Patient has fever of 103.6. Blood pressure is stable, he is tachypneic.  2/10: Patient has been admitted with sepsis from infected diabetic foot ulcer and was started on vancomycin and cefepime. He is noted to have MSSA bacteremia for which he has now been transitioned to Unasyn. 2D echocardiogram has been ordered with results pending. He remains on IV insulin drip as well as D5 fluids as beta hydroxybutyric acid remains elevated. We will continue to monitor and likely transition to a diet in the morning once DKA pathology has resolved. AKI is slowly improving. Potassium is being supplemented. We will plan to repeat blood cultures in a.m.  2/11: Appreciate ID input.  Transthoracic echocardiogram currently pending.  Repeat blood cultures pending as well.  Continue Unasyn for MSSA and enterococcal bacteremia.  We will plan to keep n.p.o. after midnight for TEE in a.m.  Patient does have history of bioprosthetic aortic valve and therefore, he is high risk for  endocarditis.  2/12: Patient had developed acute flash pulmonary edema yesterday afternoon for which IV fluids were discontinued and Lasix was given.  Chest x-ray confirming the same with elevation in BNP noted.  He is no longer in respiratory distress and has been urinating with over 4 L of diuresis overnight.  Additional dose of Lasix given this morning and will repeat this evening as well.  Continue potassium supplementation and monitor carefully.  TEE canceled for today and will be performed on Monday.  Continue on Unasyn.  Wean oxygen as tolerated.  Assessment & Plan:   Principal Problem:   Bacteremia due to methicillin susceptible Staphylococcus aureus (MSSA) Active Problems:   Hypertension   Diabetes mellitus ([place])   Sepsis ([place])   DKA (diabetic ketoacidoses) (HCC)   CKD (chronic kidney disease) stage 3, GFR 30-59 ml/min   CAD (coronary artery disease)   S/P CABG (coronary artery bypass graft)   PAD (peripheral artery disease) (HCC)   Dyslipidemia   Hypothyroid   S/P TAVR (transcatheter aortic valve replacement)   S/P transmetatarsal amputation of foot, right (HCC)   Diabetic foot ulcer (HCC)   Normocytic anemia   Thrombocytopenia (HCC)   Enterococcal bacteremia   Sepsis secondary to MSSA and enterococcal bacteremia from infected diabetic foot ulcer -Patient was initially on vancomycin and cefepime, but has been transitioned to Unasyn yesterday given findings on BC ID -Continue wound care -Sepsis physiology appears to have resolved and he is stable for transfer to general medical floor at this time -Transthoracic echocardiogram without signs of endocarditis.    LVEF 60-65%.  TEE canceled today and will plan for study to be done on 2/15. -MRI without findings of osteomyelitis or  fluid collection in need of I&D on right foot  DKA secondary to above-blood glucose elevated -Increase sliding scale insulin coverage today  Acute hypoxemic respiratory failure secondary to flash  pulmonary edema -IV fluids discontinued -Monitor I's and O's -Continue on Lasix IV twice daily and diurese -Wean oxygen as tolerated  AKI on CKD stage III with hypokalemia -Monitor closely and replete potassium levels  Peripheral arterial disease with right transmetatarsal amputation -Continue on aspirin and Plavix  History of TAVR and bioprosthetic aortic valve -Transthoracic echocardiogram without signs of vegetations noted.  LVEF 60-65%. -Patient will need TEE by Monday.  Dyslipidemia -Continue fenofibrate and pravastatin  History of hypertension -Currently controlled -Maintain on amlodipine -Monitor carefully with aggressive diuresis  Hypothyroidism -Continue levothyroxine  DVT prophylaxis: Lovenox Code Status: DNR Family Communication: None at bedside Disposition Plan:  Continue on Unasyn and diuresis.  TEE for Monday.   Consultants:   Cardiology  Procedures:   See imaging below  Antimicrobials:  Anti-infectives (From admission, onward)   Start     Dose/Rate Route Frequency Ordered Stop   05/23/19 1600  Ampicillin-Sulbactam (UNASYN) 3 g in sodium chloride 0.9 % 100 mL IVPB     3 g 200 mL/hr over 30 Minutes Intravenous Every 6 hours 05/23/19 1521     05/23/19 1000  vancomycin (VANCOREADY) IVPB 750 mg/150 mL  Status:  Discontinued     750 mg 150 mL/hr over 60 Minutes Intravenous Every 12 hours 05/23/19 0741 05/23/19 0757   05/23/19 0900  ceFEPIme (MAXIPIME) 2 g in sodium chloride 0.9 % 100 mL IVPB  Status:  Discontinued     2 g 200 mL/hr over 30 Minutes Intravenous Every 12 hours 05/23/19 0741 05/23/19 1521   05/22/19 2030  ceFEPIme (MAXIPIME) 2 g in sodium chloride 0.9 % 100 mL IVPB     2 g 200 mL/hr over 30 Minutes Intravenous  Once 05/22/19 2011 05/22/19 2156   05/22/19 2030  vancomycin (VANCOREADY) IVPB 2000 mg/400 mL     2,000 mg 200 mL/hr over 120 Minutes Intravenous  Once 05/22/19 2026 05/23/19 0013   05/22/19 2015  metroNIDAZOLE (FLAGYL)  IVPB 500 mg     500 mg 100 mL/hr over 60 Minutes Intravenous  Once 05/22/19 2011 05/22/19 2207   05/22/19 2015  vancomycin (VANCOCIN) IVPB 1000 mg/200 mL premix  Status:  Discontinued     1,000 mg 200 mL/hr over 60 Minutes Intravenous  Once 05/22/19 2011 05/22/19 2026       Subjective: Patient seen and evaluated today and remains on high flow nasal cannula oxygen.  He was noted to have sudden respiratory distress and hypoxemia yesterday for which diuresis was initiated.  He appears to be diuresing quite well and was noted to have pulmonary edema on chest x-ray.  Objective: Vitals:   05/25/19 0233 05/25/19 0300 05/25/19 0530 05/25/19 0817  BP:   (!) 159/74 (!) 164/81  Pulse: 71 69 75   Resp: (!) 24 (!) 23 (!) 24   Temp:   98.3 F (36.8 C) 98.9 F (37.2 C)  TempSrc:   Oral Oral  SpO2: 90% 90% 90%   Weight:   102.3 kg   Height:        Intake/Output Summary (Last 24 hours) at 05/25/2019 1136 Last data filed at 05/25/2019 1020 Gross per 24 hour  Intake 1586.27 ml  Output 5400 ml  Net -3813.73 ml   Filed Weights   05/22/19 2023 05/24/19 0500 05/25/19 0530  Weight: 99 kg 103.4 kg 102.3  kg    Examination:  General exam: Appears calm and comfortable  Respiratory system: Clear to auscultation. Respiratory effort normal.  Currently on high flow nasal cannula 15 L. Cardiovascular system: S1 & S2 heard, RRR. No JVD, murmurs, rubs, gallops or clicks. No pedal edema. Gastrointestinal system: Abdomen is nondistended, soft and nontender. No organomegaly or masses felt. Normal bowel sounds heard. Central nervous system: Somnolent but arousable. Extremities: Right metatarsal amputation with wound that is clean dry and intact. Skin: No rashes, lesions or ulcers Psychiatry: Difficult to assess    Data Reviewed: I have personally reviewed following labs and imaging studies  CBC: Recent Labs  Lab 05/22/19 1952 05/23/19 0742 05/24/19 0434 05/25/19 0412  WBC 7.2 9.3 9.4 10.0  HGB  13.5 11.9* 12.3* 12.3*  HCT 40.7 35.9* 38.9* 37.0*  MCV 94.7 95.7 96.5 91.8  PLT 119* 115* 123* 093   Basic Metabolic Panel: Recent Labs  Lab 05/23/19 1047 05/23/19 1328 05/23/19 1822 05/24/19 0434 05/25/19 0412  NA 134* 132* 135 131* 135  K 3.0* 3.1* 3.0* 3.1* 3.3*  CL 101 101 103 103 99  CO2 20* 22 22 22 25   GLUCOSE 217* 188* 141* 138* 263*  BUN 52* 54* 52* 49* 39*  CREATININE 1.56* 1.41* 1.35* 1.34* 1.35*  CALCIUM 7.9* 7.7* 8.0* 7.8* 8.1*  MG 2.2  --   --   --  2.1   GFR: Estimated Creatinine Clearance: 58.3 mL/min (A) (by C-G formula based on SCr of 1.35 mg/dL (H)). Liver Function Tests: Recent Labs  Lab 05/22/19 1952 05/23/19 0742  AST 23 34  ALT 19 21  ALKPHOS 53 41  BILITOT 1.7* 0.9  PROT 7.7 6.5  ALBUMIN 3.7 3.2*   No results for input(s): LIPASE, AMYLASE in the last 168 hours. No results for input(s): AMMONIA in the last 168 hours. Coagulation Profile: Recent Labs  Lab 05/22/19 1952  INR 1.2   Cardiac Enzymes: No results for input(s): CKTOTAL, CKMB, CKMBINDEX, TROPONINI in the last 168 hours. BNP (last 3 results) No results for input(s): PROBNP in the last 8760 hours. HbA1C: Recent Labs    05/22/19 2042  HGBA1C 6.7*   CBG: Recent Labs  Lab 05/24/19 0743 05/24/19 1149 05/24/19 1706 05/24/19 2109 05/25/19 0816  GLUCAP 147* 202* 223* 289* 213*   Lipid Profile: No results for input(s): CHOL, HDL, LDLCALC, TRIG, CHOLHDL, LDLDIRECT in the last 72 hours. Thyroid Function Tests: No results for input(s): TSH, T4TOTAL, FREET4, T3FREE, THYROIDAB in the last 72 hours. Anemia Panel: No results for input(s): VITAMINB12, FOLATE, FERRITIN, TIBC, IRON, RETICCTPCT in the last 72 hours. Sepsis Labs: Recent Labs  Lab 05/22/19 2046 05/22/19 2209  LATICACIDVEN 2.4* 2.3*    Recent Results (from the past 240 hour(s))  Urine culture     Status: None   Collection Time: 05/22/19  7:52 PM   Specimen: In/Out Cath Urine  Result Value Ref Range Status    Specimen Description   Final    IN/OUT CATH URINE Performed at [facility], [address] [place], [place] [address]    Special Requests   Final    NONE Performed at [facility], [address], [place], [place] [address]    Culture   Final    NO GROWTH Performed at [facility], [place] [address] [place],  [address]    Report Status 05/24/2019 FINAL  Final  Blood Culture (routine x 2)     Status: Abnormal (Preliminary result)   Collection Time: 05/22/19  8:42 PM   Specimen: BLOOD  Result Value Ref Range Status   Specimen Description   Final    BLOOD RIGHT ANTECUBITAL Performed at [facility], [address], [place], [place] [address]    Special Requests   Final    BOTTLES DRAWN AEROBIC AND ANAEROBIC Blood Culture adequate volume Performed at [facility] [place], [place] [address]    Culture  Setup Time   Final    GRAM POSITIVE COCCI IN BOTH AEROBIC AND ANAEROBIC BOTTLES Gram Stain Report Called to,Read Back By and Verified With: MCMILLIAN B. AT 0920A ON [id number] BY THOMPSON S. CRITICAL RESULT CALLED TO, READ BACK BY AND VERIFIED WITH: PHARMD J COFFEE [id number] AT 1410 BY CM    Culture (A)  Final    STAPHYLOCOCCUS AUREUS ENTEROCOCCUS FAECALIS SUSCEPTIBILITIES TO FOLLOW Performed at [facility], [place] [address] [place], [place] [address]    Report Status PENDING  Incomplete   Organism ID, Bacteria STAPHYLOCOCCUS AUREUS  Final      Susceptibility   Staphylococcus aureus - MIC*    CIPROFLOXACIN <=0.5 SENSITIVE Sensitive     ERYTHROMYCIN <=0.25 SENSITIVE Sensitive     GENTAMICIN <=0.5 SENSITIVE Sensitive     OXACILLIN <=0.25 SENSITIVE Sensitive     TETRACYCLINE <=1 SENSITIVE Sensitive     VANCOMYCIN 1 SENSITIVE Sensitive     TRIMETH/SULFA <=10 SENSITIVE Sensitive     CLINDAMYCIN <=0.25 SENSITIVE Sensitive     RIFAMPIN <=0.5 SENSITIVE Sensitive     Inducible Clindamycin NEGATIVE Sensitive     * STAPHYLOCOCCUS AUREUS  Blood Culture ID  Panel (Reflexed)     Status: Abnormal   Collection Time: 05/22/19  8:42 PM  Result Value Ref Range Status   Enterococcus species DETECTED (A) NOT DETECTED Final    Comment: CRITICAL RESULT CALLED TO, READ BACK BY AND VERIFIED WITH: PHARMD J COFFEE [id number] AT 1410 BY CM    Vancomycin resistance NOT DETECTED NOT DETECTED Final   Listeria monocytogenes NOT DETECTED NOT DETECTED Final   Staphylococcus species DETECTED (A) NOT DETECTED Final    Comment: CRITICAL RESULT CALLED TO, READ BACK BY AND VERIFIED WITH: PHARMD J COFFEE [id number] AT 1410 BY CM    Staphylococcus aureus (BCID) DETECTED (A) NOT DETECTED Final    Comment: Methicillin (oxacillin) susceptible Staphylococcus aureus (MSSA). Preferred therapy is anti staphylococcal beta lactam antibiotic (Cefazolin or Nafcillin), unless clinically contraindicated. CRITICAL RESULT CALLED TO, READ BACK BY AND VERIFIED WITH: PHARMD J COFFEE [id number] AT 62 BY CM    Methicillin resistance NOT DETECTED NOT DETECTED Final   Streptococcus species NOT DETECTED NOT DETECTED Final   Streptococcus agalactiae NOT DETECTED NOT DETECTED Final   Streptococcus pneumoniae NOT DETECTED NOT DETECTED Final   Streptococcus pyogenes NOT DETECTED NOT DETECTED Final   Acinetobacter baumannii NOT DETECTED NOT DETECTED Final   Enterobacteriaceae species NOT DETECTED NOT DETECTED Final   Enterobacter cloacae complex NOT DETECTED NOT DETECTED Final   Escherichia coli NOT DETECTED NOT DETECTED Final   Klebsiella oxytoca NOT DETECTED NOT DETECTED Final   Klebsiella pneumoniae NOT DETECTED NOT DETECTED Final   Proteus species NOT DETECTED NOT DETECTED Final   Serratia marcescens NOT DETECTED NOT DETECTED Final   Haemophilus influenzae NOT DETECTED NOT DETECTED Final   Neisseria meningitidis NOT DETECTED NOT DETECTED Final   Pseudomonas aeruginosa NOT DETECTED NOT DETECTED Final   Candida albicans NOT DETECTED NOT DETECTED Final   Candida glabrata NOT DETECTED NOT DETECTED  Final   Candida  krusei NOT DETECTED NOT DETECTED Final   Candida parapsilosis NOT DETECTED NOT DETECTED Final   Candida tropicalis NOT DETECTED NOT DETECTED Final    Comment: Performed at [facility], [place] [address] [place], [place] [address]  Blood Culture (routine x 2)     Status: Abnormal (Preliminary result)   Collection Time: 05/22/19  8:46 PM   Specimen: BLOOD RIGHT HAND  Result Value Ref Range Status   Specimen Description   Final    BLOOD RIGHT HAND Performed at [facility], [address] [place], [place] [address]    Special Requests   Final    BOTTLES DRAWN AEROBIC AND ANAEROBIC Blood Culture adequate volume Performed at [facility] [place], [place] [address]    Culture  Setup Time   Final    GRAM POSITIVE COCCI IN BOTH AEROBIC AND ANAEROBIC BOTTLES Gram Stain Report Called to,Read Back By and Verified With: MCMILLIAN B. AT 0920A ON [id number] BY THOMPSON S. Performed at [facility], [address] [place], [place] [address]    Culture (A)  Final    STAPHYLOCOCCUS AUREUS SUSCEPTIBILITIES PERFORMED ON PREVIOUS CULTURE WITHIN THE LAST 5 DAYS. GRAM POSITIVE COCCI    Report Status PENDING  Incomplete  Respiratory Panel by RT PCR (Flu A&B, Covid) - Nasopharyngeal Swab     Status: None   Collection Time: 05/22/19  9:37 PM   Specimen: Nasopharyngeal Swab  Result Value Ref Range Status   SARS Coronavirus 2 by RT PCR NEGATIVE NEGATIVE Final    Comment: (NOTE) SARS-CoV-2 target nucleic acids are NOT DETECTED. The SARS-CoV-2 RNA is generally detectable in upper respiratoy specimens during the acute phase of infection. The lowest concentration of SARS-CoV-2 viral copies this assay can detect is 131 copies/mL. A negative result does not preclude SARS-Cov-2 infection and should not be used as the sole basis for treatment or other patient management decisions. A negative result may occur with  improper specimen collection/handling, submission of specimen  other than nasopharyngeal swab, presence of viral mutation(s) within the areas targeted by this assay, and inadequate number of viral copies (<131 copies/mL). A negative result must be combined with clinical observations, patient history, and epidemiological information. The expected result is Negative. Fact Sheet for Patients:  PinkCheek.be Fact Sheet for Healthcare Providers:  GravelBags.it This test is not yet ap proved or cleared by the [place] FDA and  has been authorized for detection and/or diagnosis of SARS-CoV-2 by FDA under an Emergency Use Authorization (EUA). This EUA will remain  in effect (meaning this test can be used) for the duration of the COVID-19 declaration under Section 564(b)(1) of the Act, 21 U.S.C. section 360bbb-3(b)(1), unless the authorization is terminated or revoked sooner.    Influenza A by PCR NEGATIVE NEGATIVE Final   Influenza B by PCR NEGATIVE NEGATIVE Final    Comment: (NOTE) The Xpert Xpress SARS-CoV-2/FLU/RSV assay is intended as an aid in  the diagnosis of influenza from Nasopharyngeal swab specimens and  should not be used as a sole basis for treatment. Nasal washings and  aspirates are unacceptable for Xpert Xpress SARS-CoV-2/FLU/RSV  testing. Fact Sheet for Patients: PinkCheek.be Fact Sheet for Healthcare Providers: GravelBags.it This test is not yet approved or cleared by the [place] FDA and  has been authorized for detection and/or diagnosis of SARS-CoV-2 by  FDA under an Emergency Use Authorization (EUA). This EUA will remain  in effect (meaning this test can be used) for the duration of the  Covid-19 declaration under Section 564(b)(1) of the Act, 21  U.S.C. section 360bbb-3(b)(1), unless the authorization is  terminated or revoked. Performed at [facility], [address] [place], [place] [address]   MRSA  PCR Screening     Status: None   Collection Time: 05/23/19  4:32 AM   Specimen: Nasal Mucosa; Nasopharyngeal  Result Value Ref Range Status   MRSA by PCR NEGATIVE NEGATIVE Final    Comment:        The GeneXpert MRSA Assay (FDA approved for NASAL specimens only), is one component of a comprehensive MRSA colonization surveillance program. It is not intended to diagnose MRSA infection nor to guide or monitor treatment for MRSA infections. Performed at [facility], [address] [place], [place] [address]   Culture, blood (routine x 2)     Status: None (Preliminary result)   Collection Time: 05/24/19  7:30 AM   Specimen: BLOOD LEFT HAND  Result Value Ref Range Status   Specimen Description BLOOD LEFT HAND  Final   Special Requests   Final    BOTTLES DRAWN AEROBIC AND ANAEROBIC Blood Culture results may not be optimal due to an inadequate volume of blood received in culture bottles   Culture   Final    NO GROWTH < 24 HOURS Performed at [facility], [address], [place], [place] [address]    Report Status PENDING  Incomplete  Culture, blood (routine x 2)     Status: None (Preliminary result)   Collection Time: 05/24/19  7:30 AM   Specimen: BLOOD RIGHT HAND  Result Value Ref Range Status   Specimen Description BLOOD RIGHT HAND  Final   Special Requests   Final    BOTTLES DRAWN AEROBIC AND ANAEROBIC Blood Culture results may not be optimal due to an inadequate volume of blood received in culture bottles   Culture   Final    NO GROWTH < 24 HOURS Performed at [facility], [address], [place], [place] [address]    Report Status PENDING  Incomplete  Aerobic Culture (superficial specimen)     Status: None (Preliminary result)   Collection Time: 05/24/19  5:41 PM   Specimen: Heel  Result Value Ref Range Status   Specimen Description HEEL  Final   Special Requests NONE  Final   Gram Stain   Final    ABUNDANT WBC PRESENT, PREDOMINANTLY PMN ABUNDANT GRAM POSITIVE COCCI FEW  GRAM POSITIVE RODS RARE GRAM NEGATIVE RODS    Culture   Final    TOO YOUNG TO READ Performed at [facility], [place] [address], [place],  [address]    Report Status PENDING  Incomplete         Radiology Studies: DG Chest 1 View  Result Date: 05/24/2019 CLINICAL DATA:  Short of breath, hypoxemia EXAM: CHEST  1 VIEW COMPARISON:  05/22/2019 FINDINGS: Single frontal view of the chest demonstrates an enlarged cardiac silhouette. Stable median sternotomy and aortic valve prosthesis. Since the prior exam, increased central vascular congestion and perihilar airspace disease. Small left pleural effusion. No pneumothorax. IMPRESSION: 1. Findings consistent with pulmonary edema and congestive heart failure. Electronically Signed   By: Randa Ngo M.D.   On: 05/24/2019 18:19   ECHOCARDIOGRAM COMPLETE  Result Date: 05/24/2019    ECHOCARDIOGRAM REPORT   Patient Name:   Larry Hart Date of Exam: 05/24/2019 Medical Rec #:  [id number]       Height:       69.0 in Accession #:  [id number]      Weight:       228.0 lb Date of Birth:  10/30/1945       BSA:          2.18 m Patient Age:    52 years        BP:           115/64 mmHg Patient Gender: M               HR:           68 bpm. Exam Location:  [facility] Procedure: 2D Echo Indications:    Bacteremia 790.7 / R78.81  History:        Patient has prior history of Echocardiogram examinations, most                 recent 01/19/2012. CAD, Prior CABG, Signs/Symptoms:Bacteremia;                 Risk Factors:Dyslipidemia, Hypertension, Diabetes and                 Non-Smoker. S/P TAVR (transcatheter aortic valve replacement.  Sonographer:    Leavy Cella RDCS (AE) Referring Phys: [phone] Royanne Foots [place]  1. There is a ventricular septal bounce.. Left ventricular ejection fraction, by estimation, is 60 to 65%. The left ventricle has normal function. The left ventrical has no regional wall motion abnormalities. There is moderately increased left  ventricular hypertrophy. Left ventricular diastolic parameters are indeterminate.  2. Right ventricular systolic function is mildly reduced. The right ventricular size is mildly enlarged. There is moderately elevated pulmonary artery systolic pressure.  3. Left atrial size was severely dilated.  4. Right atrial size was mildly dilated.  5. The mitral valve is normal in structure and function. Mild mitral valve regurgitation. No evidence of mitral stenosis.  6. Edwards 26 mm Sapien 3 Ultra valve in the AV position. Mean gradient slightly above reported normals for valve, slightly above prior mean gradient of 15 mmHg from 10/18/2018 echo. Mild difference would not be considered significant. . The aortic valve is normal in structure and function. Aortic valve regurgitation is not visualized. FINDINGS  Left Ventricle: There is a ventricular septal bounce. Left ventricular ejection fraction, by estimation, is 60 to 65%. The left ventricle has normal function. The left ventricle has no regional wall motion abnormalities. There is moderately increased left ventricular hypertrophy. Left ventricular diastolic parameters are indeterminate. Right Ventricle: The right ventricular size is mildly enlarged. Right vetricular wall thickness was not assessed. Right ventricular systolic function is mildly reduced. There is moderately elevated pulmonary artery systolic pressure. The tricuspid regurgitant velocity is 2.85 m/s, and with an assumed right atrial pressure of 10 mmHg, the estimated right ventricular systolic pressure is 94.7 mmHg. Left Atrium: Left atrial size was severely dilated. Right Atrium: Right atrial size was mildly dilated. Pericardium: There is no evidence of pericardial effusion. Mitral Valve: The mitral valve is normal in structure and function. Mild mitral valve regurgitation. No evidence of mitral valve stenosis. Tricuspid Valve: The tricuspid valve is normal in structure. Tricuspid valve regurgitation is mild .  No evidence of tricuspid stenosis. Aortic Valve: Edwards 26 mm Sapien 3 Ultra valve in the AV position. Mean gradient slightly above reported normals for valve, slightly above prior mean gradient of 15 mmHg from 10/18/2018 echo. Mild difference would not be considered significant. The aortic valve is normal in structure and function. Aortic valve regurgitation is not visualized. Aortic valve mean  gradient measures 17.8 mmHg. Aortic valve peak gradient measures 31.6 mmHg. Pulmonic Valve: The pulmonic valve was not well visualized. Pulmonic valve regurgitation is not visualized. No evidence of pulmonic stenosis. Aorta: The aortic root is normal in size and structure. Pulmonary Artery: Indeterminate PASP, poorly visualized IVC. Venous: The inferior vena cava was not well visualized. IAS/Shunts: No atrial level shunt detected by color flow Doppler.  LEFT VENTRICLE PLAX 2D LVIDd:         4.24 cm Diastology LVIDs:         2.85 cm LV e' lateral:   7.83 cm/s LV PW:         1.20 cm LV E/e' lateral: 12.0 LV IVS:        1.17 cm LV e' medial:    5.98 cm/s LV SV Index:   21.71   LV E/e' medial:  15.7  RIGHT VENTRICLE RV S prime:     9.68 cm/s TAPSE (M-mode): 1.1 cm LEFT ATRIUM           Index       RIGHT ATRIUM           Index LA diam:      4.30 cm 1.97 cm/m  RA Area:     18.90 cm LA Vol (A2C): 86.8 ml 39.74 ml/m RA Volume:   50.30 ml  23.03 ml/m LA Vol (A4C): 97.1 ml 44.46 ml/m  AORTIC VALVE AV Vmax:           280.89 cm/s AV Vmean:          199.593 cm/s AV VTI:            0.595 m AV Peak Grad:      31.6 mmHg AV Mean Grad:      17.8 mmHg LVOT Vmax:         79.67 cm/s LVOT Vmean:        56.372 cm/s LVOT VTI:          0.156 m LVOT/AV VTI ratio: 0.26  AORTA Ao Root diam: 3.20 cm MITRAL VALVE                        TRICUSPID VALVE MV Area (PHT): 3.03 cm             TR Peak grad:   32.5 mmHg MV Decel Time: 250 msec             TR Vmax:        285.00 cm/s MV E velocity: 93.60 cm/s 103 cm/s MV A velocity: 34.00 cm/s 70.3 cm/s  SHUNTS MV E/A ratio:  2.75       1.5       Systemic VTI: 0.16 m Carlyle Dolly MD Electronically signed by Carlyle Dolly MD Signature Date/Time: 05/24/2019/2:18:21 PM    Final         Scheduled Meds: . amLODipine  10 mg Oral Daily  . aspirin  81 mg Oral Daily  . baclofen  10 mg Oral TID  . Chlorhexidine Gluconate Cloth  6 each Topical Daily  . cholecalciferol  2,000 Units Oral Daily  . clopidogrel  75 mg Oral Daily  . enoxaparin (LOVENOX) injection  40 mg Subcutaneous Q24H  . escitalopram  10 mg Oral Daily  . fenofibrate  160 mg Oral Daily  . gabapentin  1,200 mg Oral TID  . insulin aspart  0-15 Units Subcutaneous TID WC  . insulin aspart  0-5 Units Subcutaneous QHS  .  insulin aspart  4 Units Subcutaneous TID WC  . insulin glargine  30 Units Subcutaneous BID  . isosorbide mononitrate  30 mg Oral Daily  . levothyroxine  50 mcg Oral Q0600  . pantoprazole  40 mg Oral Daily  . potassium chloride  40 mEq Oral BID  . pravastatin  40 mg Oral Daily   Continuous Infusions: . sodium chloride Stopped (05/23/19 0703)  . ampicillin-sulbactam (UNASYN) IV 3 g (05/25/19 1016)  . insulin Stopped (05/24/19 0721)     LOS: 2 days    Time spent: 30 minutes    Akelia Husted Darleen Crocker, DO [facility] Pager [phone]  If 7PM-7AM, please contact night-coverage [email or web address] Password [facility] 05/25/2019, 11:36 AM

## 2019-05-25 NOTE — Care Management Important Message (Signed)
Important Message  Patient Details  Name: Larry Hart MRN: [id number] Date of Birth: [birth year]   Medicare Important Message Given:  Yes     [name] 05/25/2019, 4:20 PM

## 2019-05-25 NOTE — TOC Initial Note (Signed)
Transition of Care [facility]) - Initial/Assessment Note    Patient Details  Name: Larry Hart MRN: [id number] Date of Birth: [birth year]  [facility]) CM/SW Contact:    Ihor Gully, LCSW Phone Number: 05/25/2019, 3:24 PM  Clinical Narrative:                 Patient is from home with wife. Independent at baseline. Uses CPAP at night. Admitted for sepsis. Maintains medical appointments. Hasn't driven since April, 6283, due to diabetic foot ulcer and foot being in a boot.  TOC following and will address needs as they arise.   Expected Discharge Plan: Home/Self Care Barriers to Discharge: Continued Medical Work up   Patient Goals and CMS Choice Patient states their goals for this hospitalization and ongoing recovery are:: return home      Expected Discharge Plan and Services Expected Discharge Plan: Home/Self Care       Living arrangements for the past 2 months: Single Family Home                                      Prior Living Arrangements/Services Living arrangements for the past 2 months: Single Family Home Lives with:: Spouse Patient language and need for interpreter reviewed:: Yes Do you feel safe going back to the place where you live?: Yes      Need for Family Participation in Patient Care: Yes (Comment) Care giver support system in place?: Yes (comment)   Criminal Activity/Legal Involvement Pertinent to Current Situation/Hospitalization: No - Comment as needed  Activities of Daily Living Home Assistive Devices/Equipment: CBG Meter ADL Screening (condition at time of admission) Patient's cognitive ability adequate to safely complete daily activities?: Yes Is the patient deaf or have difficulty hearing?: No Does the patient have difficulty seeing, even when wearing glasses/contacts?: No Does the patient have difficulty concentrating, remembering, or making decisions?: No Patient able to express need for assistance with ADLs?: Yes Does the  patient have difficulty dressing or bathing?: No Independently performs ADLs?: Yes (appropriate for developmental age) Does the patient have difficulty walking or climbing stairs?: No Weakness of Legs: None Weakness of Arms/Hands: None  Permission Sought/Granted Permission sought to share information with : Family Supports          Permission granted to share info w Relationship: spouse, Mrs. Edmister.     Emotional Assessment Appearance:: Appears stated age     Orientation: : Oriented to Self, Oriented to Place, Oriented to  Time, Oriented to Situation Alcohol / Substance Use: Not Applicable Psych Involvement: No (comment)  Admission diagnosis:  DKA (diabetic ketoacidoses) ([place]) [E11.10] Hyperglycemia [R73.9] Abscess of right foot [L02.611] AKI (acute kidney injury) ([place]) [N17.9] Sepsis ([place]) [A41.9] Sepsis, due to unspecified organism, unspecified whether acute organ dysfunction present [facility]) [A41.9] Patient Active Problem List   Diagnosis Date Noted  . Sepsis ([place]) 05/23/2019  . DKA (diabetic ketoacidoses) ([place]) 05/23/2019  . CKD (chronic kidney disease) stage 3, GFR 30-59 ml/min 05/23/2019  . CAD (coronary artery disease) 05/23/2019  . S/P CABG (coronary artery bypass graft) 05/23/2019  . PAD (peripheral artery disease) ([place]) 05/23/2019  . Dyslipidemia 05/23/2019  . Hypothyroid 05/23/2019  . 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 S/P transmetatarsal amputation of foot, right ([place]) 05/23/2019  . Diabetic foot ulcer ([place]) 05/23/2019  . Normocytic anemia 05/23/2019  . Thrombocytopenia ([place]) 05/23/2019  . Bacteremia due to methicillin  susceptible Staphylococcus aureus (MSSA) 05/23/2019  . Enterococcal bacteremia 05/23/2019  . Hypertension   . Diabetes mellitus ([place])    PCP:  Rory Percy, MD Pharmacy:   [facility] [address] [place] [address] Phone: [phone] Fax: [phone]     Social  Determinants of Health (SDOH) Interventions    Readmission Risk Interventions No flowsheet data found.

## 2019-05-26 LAB — CBC
HCT: 35.5 % — ABNORMAL LOW (ref 39.0–52.0)
Hemoglobin: 11.7 g/dL — ABNORMAL LOW (ref 13.0–17.0)
MCH: 30.3 pg (ref 26.0–34.0)
MCHC: 33 g/dL (ref 30.0–36.0)
MCV: 92 fL (ref 80.0–100.0)
Platelets: 179 10*3/uL (ref 150–400)
RBC: 3.86 MIL/uL — ABNORMAL LOW (ref 4.22–5.81)
RDW: 12.7 % (ref 11.5–15.5)
WBC: 7.1 10*3/uL (ref 4.0–10.5)
nRBC: 0 % (ref 0.0–0.2)

## 2019-05-26 LAB — BASIC METABOLIC PANEL
Anion gap: 13 (ref 5–15)
BUN: 33 mg/dL — ABNORMAL HIGH (ref 8–23)
CO2: 28 mmol/L (ref 22–32)
Calcium: 8.3 mg/dL — ABNORMAL LOW (ref 8.9–10.3)
Chloride: 97 mmol/L — ABNORMAL LOW (ref 98–111)
Creatinine, Ser: 1.24 mg/dL (ref 0.61–1.24)
GFR calc Af Amer: >60 mL/min (ref >60)
GFR calc non Af Amer: 58 mL/min — ABNORMAL LOW (ref >60)
Glucose, Bld: 160 mg/dL — ABNORMAL HIGH (ref 70–99)
Potassium: 3 mmol/L — ABNORMAL LOW (ref 3.5–5.1)
Sodium: 138 mmol/L (ref 135–145)

## 2019-05-26 LAB — CULTURE, BLOOD (ROUTINE X 2)
Special Requests: ADEQUATE
Special Requests: ADEQUATE

## 2019-05-26 LAB — MAGNESIUM: Magnesium: 2 mg/dL (ref 1.7–2.4)

## 2019-05-26 LAB — GLUCOSE, CAPILLARY
Glucose-Capillary: 144 mg/dL — ABNORMAL HIGH (ref 70–99)
Glucose-Capillary: 228 mg/dL — ABNORMAL HIGH (ref 70–99)
Glucose-Capillary: 186 mg/dL — ABNORMAL HIGH (ref 70–99)
Glucose-Capillary: 245 mg/dL — ABNORMAL HIGH (ref 70–99)

## 2019-05-26 MED ORDER — MELATONIN 3 MG PO TABS
9.0000 mg | ORAL_TABLET | Freq: Once | ORAL | Status: AC
  Administered 2019-05-26: 9 mg via ORAL
  Filled 2019-05-26: qty 3

## 2019-05-26 MED ORDER — POTASSIUM CHLORIDE CRYS ER 20 MEQ PO TBCR
40.0000 meq | EXTENDED_RELEASE_TABLET | Freq: Three times a day (TID) | ORAL | Status: AC
  Administered 2019-05-26 (×2): 40 meq via ORAL
  Filled 2019-05-26 (×2): qty 2

## 2019-05-26 MED ORDER — ALPRAZOLAM 0.25 MG PO TABS
0.2500 mg | ORAL_TABLET | Freq: Three times a day (TID) | ORAL | Status: DC | PRN
  Administered 2019-05-26 – 2019-05-30 (×8): 0.25 mg via ORAL
  Filled 2019-05-26 (×8): qty 1

## 2019-05-26 NOTE — Progress Notes (Signed)
Larry Hart has had improvement with his mood and anxiety today. He has taken all of his medications without any hassle and have been able to wean him down to 2L of oxygen maintaining levels between 95%-96%. I educated him on trying to take deeper breaths to improve air movement.

## 2019-05-26 NOTE — Progress Notes (Signed)
PROGRESS NOTE    Aleksa Catterton  WJX:[id number] DOB: 19-Apr-1945 DOA: 05/22/2019 PCP: Rory Percy, MD   Brief Narrative:  Per HPI: [name] a74 y.o.male,with history of diabetes mellitus type 2, hypertension, dyslipidemia, peripheral vascular disease, diabetic foot ulcer,s/p right transmetatarsal amputation who was brought to the hospital by patient's wife with chief complaints of vomiting since Sunday, generalized weakness. Patient has also been confused intermittently, and not talking like his usual self as per wife. Patient has diabetic foot ulcer and was seen by vascular surgery, he had cast on right foot which was removed last week. At that time the ulcer was looking improved. However today in the ED patient has serosanguineous discharge from the ulcer and right foot. Patient empirically started on vancomycin and cefepime for infected diabetic foot ulcer. No history of chest pain or shortness of breath. Patient has fever of 103.6. Blood pressure is stable, he is tachypneic.  2/10: Patient has been admitted with sepsis from infected diabetic foot ulcer and was started on vancomycin and cefepime. He is noted to have MSSA bacteremia for which he has now been transitioned to Unasyn. 2D echocardiogram has been ordered with results pending. He remains on IV insulin drip as well as D5 fluids as beta hydroxybutyric acid remains elevated. We will continue to monitor and likely transition to a diet in the morning once DKA pathology has resolved. AKI is slowly improving. Potassium is being supplemented. We will plan to repeat blood cultures in a.m.  2/11:Appreciate ID input. Transthoracic echocardiogram currently pending. Repeat blood cultures pending as well. Continue Unasyn for MSSA and enterococcal bacteremia. We will plan to keep n.p.o. after midnight for TEE in a.m. Patient does have history of bioprosthetic aortic valve and therefore, he is high risk for  endocarditis.  2/12: Patient had developed acute flash pulmonary edema yesterday afternoon for which IV fluids were discontinued and Lasix was given.  Chest x-ray confirming the same with elevation in BNP noted.  He is no longer in respiratory distress and has been urinating with over 4 L of diuresis overnight.  Additional dose of Lasix given this morning and will repeat this evening as well.  Continue potassium supplementation and monitor carefully.  TEE canceled for today and will be performed on Monday.  Continue on Unasyn.  Wean oxygen as tolerated.  2/13: Patient continues to diurese well on his current Lasix dose.  Oxygen requirements are decreasing, but he is still on 10 L nasal cannula.  No respiratory distress noted.  Some agitation noted overnight.   Assessment & Plan:   Principal Problem:   Bacteremia due to methicillin susceptible Staphylococcus aureus (MSSA) Active Problems:   Hypertension   Diabetes mellitus ([place])   Sepsis ([place])   DKA (diabetic ketoacidoses) (HCC)   CKD (chronic kidney disease) stage 3, GFR 30-59 ml/min   CAD (coronary artery disease)   S/P CABG (coronary artery bypass graft)   PAD (peripheral artery disease) (HCC)   Dyslipidemia   Hypothyroid   S/P TAVR (transcatheter aortic valve replacement)   S/P transmetatarsal amputation of foot, right (HCC)   Diabetic foot ulcer (HCC)   Normocytic anemia   Thrombocytopenia (HCC)   Enterococcal bacteremia   Sepsis secondary to MSSA and enterococcal bacteremia from infected diabetic foot ulcer -Patient was initially on vancomycin and cefepime, but has been transitioned to Unasyn given findings on BC ID -Continue wound care -Sepsis physiology appears to have resolved and he is stable for transfer to general medical floor at this time -Transthoracic  echocardiogram without signs of endocarditis.   LVEF 60-65%.  TEE canceled due to hypoxemia and will plan for study to be done on 2/15. -MRI without findings of  osteomyelitis or fluid collection in need of I&D on right foot  DKA secondary to above-blood glucose elevated -Increase sliding scale insulin coverage today  Acute hypoxemic respiratory failure secondary to flash pulmonary edema -Monitor I's and O's with adequate diuresis noted -Continue on Lasix IV twice daily and continue to diurese -Wean oxygen as tolerated  AKI on CKD stage III with hypokalemia -Monitor closely and replete potassium levels -Potassium dosing to 3 times daily  Peripheral arterial disease with right transmetatarsal amputation -Continue on aspirin and Plavix  History of TAVR and bioprosthetic aortic valve -Transthoracic echocardiogram without signs of vegetations noted. LVEF 60-65%. -Patient will need TEE by Monday.  Dyslipidemia -Continue fenofibrate and pravastatin  History of hypertension -Currently controlled -Maintain on amlodipine -Monitor carefully with aggressive diuresis  Hypothyroidism -Continue levothyroxine  DVT prophylaxis:Lovenox Code Status:DNR Family Communication:None at bedside Disposition Plan: Continue on Unasyn and diuresis.  TEE for Monday.   Consultants:  Cardiology  Procedures:  See imaging below  Antimicrobials:  Anti-infectives (From admission, onward)   Start     Dose/Rate Route Frequency Ordered Stop   05/23/19 1600  Ampicillin-Sulbactam (UNASYN) 3 g in sodium chloride 0.9 % 100 mL IVPB     3 g 200 mL/hr over 30 Minutes Intravenous Every 6 hours 05/23/19 1521     05/23/19 1000  vancomycin (VANCOREADY) IVPB 750 mg/150 mL  Status:  Discontinued     750 mg 150 mL/hr over 60 Minutes Intravenous Every 12 hours 05/23/19 0741 05/23/19 0757   05/23/19 0900  ceFEPIme (MAXIPIME) 2 g in sodium chloride 0.9 % 100 mL IVPB  Status:  Discontinued     2 g 200 mL/hr over 30 Minutes Intravenous Every 12 hours 05/23/19 0741 05/23/19 1521   05/22/19 2030  ceFEPIme (MAXIPIME) 2 g in sodium chloride 0.9 % 100 mL IVPB      2 g 200 mL/hr over 30 Minutes Intravenous  Once 05/22/19 2011 05/22/19 2156   05/22/19 2030  vancomycin (VANCOREADY) IVPB 2000 mg/400 mL     2,000 mg 200 mL/hr over 120 Minutes Intravenous  Once 05/22/19 2026 05/23/19 0013   05/22/19 2015  metroNIDAZOLE (FLAGYL) IVPB 500 mg     500 mg 100 mL/hr over 60 Minutes Intravenous  Once 05/22/19 2011 05/22/19 2207   05/22/19 2015  vancomycin (VANCOCIN) IVPB 1000 mg/200 mL premix  Status:  Discontinued     1,000 mg 200 mL/hr over 60 Minutes Intravenous  Once 05/22/19 2011 05/22/19 2026       Subjective: Patient seen and evaluated today with no new acute complaints or concerns. No acute concerns or events noted overnight.  Objective: Vitals:   05/25/19 1616 05/25/19 2115 05/25/19 2300 05/26/19 0544  BP: 139/72 (!) 157/73  (!) 152/72  Pulse: 62 65 64 63  Resp: (!) 24 (!) 21 20 19   Temp: 100.1 F (37.8 C) 98.3 F (36.8 C)  98.6 F (37 C)  TempSrc: Oral Oral    SpO2: 95% 97% 93% 95%  Weight:      Height:        Intake/Output Summary (Last 24 hours) at 05/26/2019 1101 Last data filed at 05/26/2019 0900 Gross per 24 hour  Intake 1060 ml  Output 3700 ml  Net -2640 ml   Filed Weights   05/22/19 2023 05/24/19 0500 05/25/19 0530  Weight:  99 kg 103.4 kg 102.3 kg    Examination:  General exam: Appears calm and comfortable  Respiratory system: Clear to auscultation. Respiratory effort normal.  Continues on 2 L nasal cannula oxygen. Cardiovascular system: S1 & S2 heard, RRR. No JVD, murmurs, rubs, gallops or clicks. No pedal edema. Gastrointestinal system: Abdomen is nondistended, soft and nontender. No organomegaly or masses felt. Normal bowel sounds heard. Central nervous system: Alert and oriented. No focal neurological deficits. Extremities: Scant edema noted Skin: No rashes, lesions or ulcers Psychiatry: Judgement and insight appear normal. Mood & affect appropriate.     Data Reviewed: I have personally reviewed following  labs and imaging studies  CBC: Recent Labs  Lab 05/22/19 1952 05/23/19 0742 05/24/19 0434 05/25/19 0412 05/26/19 0720  WBC 7.2 9.3 9.4 10.0 7.1  HGB 13.5 11.9* 12.3* 12.3* 11.7*  HCT 40.7 35.9* 38.9* 37.0* 35.5*  MCV 94.7 95.7 96.5 91.8 92.0  PLT 119* 115* 123* 151 742   Basic Metabolic Panel: Recent Labs  Lab 05/23/19 1047 05/23/19 1047 05/23/19 1328 05/23/19 1822 05/24/19 0434 05/25/19 0412 05/26/19 0720  NA 134*   < > 132* 135 131* 135 138  K 3.0*   < > 3.1* 3.0* 3.1* 3.3* 3.0*  CL 101   < > 101 103 103 99 97*  CO2 20*   < > 22 22 22 25 28   GLUCOSE 217*   < > 188* 141* 138* 263* 160*  BUN 52*   < > 54* 52* 49* 39* 33*  CREATININE 1.56*   < > 1.41* 1.35* 1.34* 1.35* 1.24  CALCIUM 7.9*   < > 7.7* 8.0* 7.8* 8.1* 8.3*  MG 2.2  --   --   --   --  2.1 2.0   < > = values in this interval not displayed.   GFR: Estimated Creatinine Clearance: 63.4 mL/min (by C-G formula based on SCr of 1.24 mg/dL). Liver Function Tests: Recent Labs  Lab 05/22/19 1952 05/23/19 0742  AST 23 34  ALT 19 21  ALKPHOS 53 41  BILITOT 1.7* 0.9  PROT 7.7 6.5  ALBUMIN 3.7 3.2*   No results for input(s): LIPASE, AMYLASE in the last 168 hours. No results for input(s): AMMONIA in the last 168 hours. Coagulation Profile: Recent Labs  Lab 05/22/19 1952  INR 1.2   Cardiac Enzymes: No results for input(s): CKTOTAL, CKMB, CKMBINDEX, TROPONINI in the last 168 hours. BNP (last 3 results) No results for input(s): PROBNP in the last 8760 hours. HbA1C: No results for input(s): HGBA1C in the last 72 hours. CBG: Recent Labs  Lab 05/25/19 1205 05/25/19 1649 05/25/19 2038 05/26/19 0710 05/26/19 1100  GLUCAP 201* 222* 226* 144* 228*   Lipid Profile: No results for input(s): CHOL, HDL, LDLCALC, TRIG, CHOLHDL, LDLDIRECT in the last 72 hours. Thyroid Function Tests: No results for input(s): TSH, T4TOTAL, FREET4, T3FREE, THYROIDAB in the last 72 hours. Anemia Panel: No results for input(s):  VITAMINB12, FOLATE, FERRITIN, TIBC, IRON, RETICCTPCT in the last 72 hours. Sepsis Labs: Recent Labs  Lab 05/22/19 2046 05/22/19 2209  LATICACIDVEN 2.4* 2.3*    Recent Results (from the past 240 hour(s))  Urine culture     Status: None   Collection Time: 05/22/19  7:52 PM   Specimen: In/Out Cath Urine  Result Value Ref Range Status   Specimen Description   Final    IN/OUT CATH URINE Performed at [facility], [address] [place], [place] [address]    Special Requests  Final    NONE Performed at [facility], [address], [place], [place] [address]    Culture   Final    NO GROWTH Performed at [facility], [place] [address], [place], [place] [address]    Report Status 05/24/2019 FINAL  Final  Blood Culture (routine x 2)     Status: Abnormal   Collection Time: 05/22/19  8:42 PM   Specimen: BLOOD  Result Value Ref Range Status   Specimen Description   Final    BLOOD RIGHT ANTECUBITAL Performed at [facility], [address] [place], [place] [address]    Special Requests   Final    BOTTLES DRAWN AEROBIC AND ANAEROBIC Blood Culture adequate volume Performed at [place] [place], [place] [address]    Culture  Setup Time   Final    GRAM POSITIVE COCCI IN BOTH AEROBIC AND ANAEROBIC BOTTLES Gram Stain Report Called to,Read Back By and Verified With: MCMILLIAN B. AT 0920A ON [id number] BY THOMPSON S. CRITICAL RESULT CALLED TO, READ BACK BY AND VERIFIED WITH: PHARMD J COFFEE [id number] AT 1410 BY CM Performed at [facility], [place] [address], [place], [place] [address]    Culture STAPHYLOCOCCUS AUREUS ENTEROCOCCUS FAECALIS  (A)  Final   Report Status 05/26/2019 FINAL  Final   Organism ID, Bacteria STAPHYLOCOCCUS AUREUS  Final   Organism ID, Bacteria ENTEROCOCCUS FAECALIS  Final      Susceptibility   Enterococcus faecalis - MIC*    AMPICILLIN <=2 SENSITIVE Sensitive     VANCOMYCIN 1 SENSITIVE Sensitive     GENTAMICIN SYNERGY SENSITIVE Sensitive      * ENTEROCOCCUS FAECALIS   Staphylococcus aureus - MIC*    CIPROFLOXACIN <=0.5 SENSITIVE Sensitive     ERYTHROMYCIN <=0.25 SENSITIVE Sensitive     GENTAMICIN <=0.5 SENSITIVE Sensitive     OXACILLIN <=0.25 SENSITIVE Sensitive     TETRACYCLINE <=1 SENSITIVE Sensitive     VANCOMYCIN 1 SENSITIVE Sensitive     TRIMETH/SULFA <=10 SENSITIVE Sensitive     CLINDAMYCIN <=0.25 SENSITIVE Sensitive     RIFAMPIN <=0.5 SENSITIVE Sensitive     Inducible Clindamycin NEGATIVE Sensitive     * STAPHYLOCOCCUS AUREUS  Blood Culture ID Panel (Reflexed)     Status: Abnormal   Collection Time: 05/22/19  8:42 PM  Result Value Ref Range Status   Enterococcus species DETECTED (A) NOT DETECTED Final    Comment: CRITICAL RESULT CALLED TO, READ BACK BY AND VERIFIED WITH: PHARMD J COFFEE [id number] AT 1410 BY CM    Vancomycin resistance NOT DETECTED NOT DETECTED Final   Listeria monocytogenes NOT DETECTED NOT DETECTED Final   Staphylococcus species DETECTED (A) NOT DETECTED Final    Comment: CRITICAL RESULT CALLED TO, READ BACK BY AND VERIFIED WITH: PHARMD J COFFEE [id number] AT 1410 BY CM    Staphylococcus aureus (BCID) DETECTED (A) NOT DETECTED Final    Comment: Methicillin (oxacillin) susceptible Staphylococcus aureus (MSSA). Preferred therapy is anti staphylococcal beta lactam antibiotic (Cefazolin or Nafcillin), unless clinically contraindicated. CRITICAL RESULT CALLED TO, READ BACK BY AND VERIFIED WITH: PHARMD J COFFEE [id number] AT 66 BY CM    Methicillin resistance NOT DETECTED NOT DETECTED Final   Streptococcus species NOT DETECTED NOT DETECTED Final   Streptococcus agalactiae NOT DETECTED NOT DETECTED Final   Streptococcus pneumoniae NOT DETECTED NOT DETECTED Final   Streptococcus pyogenes NOT DETECTED NOT DETECTED Final   Acinetobacter baumannii NOT DETECTED NOT DETECTED Final   Enterobacteriaceae species NOT  DETECTED NOT DETECTED Final   Enterobacter cloacae complex NOT DETECTED NOT DETECTED Final    Escherichia coli NOT DETECTED NOT DETECTED Final   Klebsiella oxytoca NOT DETECTED NOT DETECTED Final   Klebsiella pneumoniae NOT DETECTED NOT DETECTED Final   Proteus species NOT DETECTED NOT DETECTED Final   Serratia marcescens NOT DETECTED NOT DETECTED Final   Haemophilus influenzae NOT DETECTED NOT DETECTED Final   Neisseria meningitidis NOT DETECTED NOT DETECTED Final   Pseudomonas aeruginosa NOT DETECTED NOT DETECTED Final   Candida albicans NOT DETECTED NOT DETECTED Final   Candida glabrata NOT DETECTED NOT DETECTED Final   Candida krusei NOT DETECTED NOT DETECTED Final   Candida parapsilosis NOT DETECTED NOT DETECTED Final   Candida tropicalis NOT DETECTED NOT DETECTED Final    Comment: Performed at [facility], [place] [address] [place], [place] [address]  Blood Culture (routine x 2)     Status: Abnormal (Preliminary result)   Collection Time: 05/22/19  8:46 PM   Specimen: BLOOD RIGHT HAND  Result Value Ref Range Status   Specimen Description   Final    BLOOD RIGHT HAND Performed at [facility], [address], [place], [place] [address]    Special Requests   Final    BOTTLES DRAWN AEROBIC AND ANAEROBIC Blood Culture adequate volume Performed at [place] [place], [place] [address]    Culture  Setup Time   Final    GRAM POSITIVE COCCI IN BOTH AEROBIC AND ANAEROBIC BOTTLES Gram Stain Report Called to,Read Back By and Verified With: MCMILLIAN B. AT 0920A ON [id number] BY THOMPSON S. Performed at [facility], [address], [place], [place] [address]    Culture (A)  Final    STAPHYLOCOCCUS AUREUS SUSCEPTIBILITIES PERFORMED ON PREVIOUS CULTURE WITHIN THE LAST 5 DAYS. GRAM POSITIVE COCCI IDENTIFICATION TO FOLLOW Performed at [facility], [place] [address] [place], [place] [address]    Report Status PENDING  Incomplete  Respiratory Panel by RT PCR (Flu A&B, Covid) - Nasopharyngeal Swab     Status: None   Collection Time: 05/22/19  9:37 PM    Specimen: Nasopharyngeal Swab  Result Value Ref Range Status   SARS Coronavirus 2 by RT PCR NEGATIVE NEGATIVE Final    Comment: (NOTE) SARS-CoV-2 target nucleic acids are NOT DETECTED. The SARS-CoV-2 RNA is generally detectable in upper respiratoy specimens during the acute phase of infection. The lowest concentration of SARS-CoV-2 viral copies this assay can detect is 131 copies/mL. A negative result does not preclude SARS-Cov-2 infection and should not be used as the sole basis for treatment or other patient management decisions. A negative result may occur with  improper specimen collection/handling, submission of specimen other than nasopharyngeal swab, presence of viral mutation(s) within the areas targeted by this assay, and inadequate number of viral copies (<131 copies/mL). A negative result must be combined with clinical observations, patient history, and epidemiological information. The expected result is Negative. Fact Sheet for Patients:  PinkCheek.be Fact Sheet for Healthcare Providers:  GravelBags.it This test is not yet ap proved or cleared by the [place] FDA and  has been authorized for detection and/or diagnosis of SARS-CoV-2 by FDA under an Emergency Use Authorization (EUA). This EUA will remain  in effect (meaning this test can be used) for the duration of the COVID-19 declaration under Section 564(b)(1) of the Act, 21 U.S.C. section 360bbb-3(b)(1), unless the authorization is terminated or revoked sooner.    Influenza A by PCR  NEGATIVE NEGATIVE Final   Influenza B by PCR NEGATIVE NEGATIVE Final    Comment: (NOTE) The Xpert Xpress SARS-CoV-2/FLU/RSV assay is intended as an aid in  the diagnosis of influenza from Nasopharyngeal swab specimens and  should not be used as a sole basis for treatment. Nasal washings and  aspirates are unacceptable for Xpert Xpress SARS-CoV-2/FLU/RSV  testing. Fact Sheet  for Patients: PinkCheek.be Fact Sheet for Healthcare Providers: GravelBags.it This test is not yet approved or cleared by the [place] FDA and  has been authorized for detection and/or diagnosis of SARS-CoV-2 by  FDA under an Emergency Use Authorization (EUA). This EUA will remain  in effect (meaning this test can be used) for the duration of the  Covid-19 declaration under Section 564(b)(1) of the Act, 21  U.S.C. section 360bbb-3(b)(1), unless the authorization is  terminated or revoked. Performed at [facility], [address] [place], [place] [address]   MRSA PCR Screening     Status: None   Collection Time: 05/23/19  4:32 AM   Specimen: Nasal Mucosa; Nasopharyngeal  Result Value Ref Range Status   MRSA by PCR NEGATIVE NEGATIVE Final    Comment:        The GeneXpert MRSA Assay (FDA approved for NASAL specimens only), is one component of a comprehensive MRSA colonization surveillance program. It is not intended to diagnose MRSA infection nor to guide or monitor treatment for MRSA infections. Performed at [facility], [address] [place], [place] [address]   Culture, blood (routine x 2)     Status: None (Preliminary result)   Collection Time: 05/24/19  7:30 AM   Specimen: BLOOD LEFT HAND  Result Value Ref Range Status   Specimen Description BLOOD LEFT HAND  Final   Special Requests   Final    BOTTLES DRAWN AEROBIC AND ANAEROBIC Blood Culture results may not be optimal due to an inadequate volume of blood received in culture bottles   Culture   Final    NO GROWTH 2 DAYS Performed at [facility], [address] [place], [place] [address]    Report Status PENDING  Incomplete  Culture, blood (routine x 2)     Status: None (Preliminary result)   Collection Time: 05/24/19  7:30 AM   Specimen: BLOOD RIGHT HAND  Result Value Ref Range Status   Specimen Description BLOOD RIGHT HAND  Final   Special Requests    Final    BOTTLES DRAWN AEROBIC AND ANAEROBIC Blood Culture results may not be optimal due to an inadequate volume of blood received in culture bottles   Culture   Final    NO GROWTH 2 DAYS Performed at [facility], [address], [place], [place] [address]    Report Status PENDING  Incomplete  Aerobic Culture (superficial specimen)     Status: None (Preliminary result)   Collection Time: 05/24/19  5:41 PM   Specimen: Heel  Result Value Ref Range Status   Specimen Description HEEL  Final   Special Requests NONE  Final   Gram Stain   Final    ABUNDANT WBC PRESENT, PREDOMINANTLY PMN ABUNDANT GRAM POSITIVE COCCI FEW GRAM POSITIVE RODS RARE GRAM NEGATIVE RODS    Culture   Final    TOO YOUNG TO READ Performed at [facility], [place] [address] [place], [place] [address]    Report Status PENDING  Incomplete         Radiology Studies: DG Chest 1 View  Result Date: 05/24/2019 CLINICAL  DATA:  Short of breath, hypoxemia EXAM: CHEST  1 VIEW COMPARISON:  05/22/2019 FINDINGS: Single frontal view of the chest demonstrates an enlarged cardiac silhouette. Stable median sternotomy and aortic valve prosthesis. Since the prior exam, increased central vascular congestion and perihilar airspace disease. Small left pleural effusion. No pneumothorax. IMPRESSION: 1. Findings consistent with pulmonary edema and congestive heart failure. Electronically Signed   By: Randa Ngo M.D.   On: 05/24/2019 18:19   ECHOCARDIOGRAM COMPLETE  Result Date: 05/24/2019    ECHOCARDIOGRAM REPORT   Patient Name:   KINDRED REIDINGER Date of Exam: 05/24/2019 Medical Rec #:  [id number]       Height:       69.0 in Accession #:    [id number]      Weight:       228.0 lb Date of Birth:  [birth year]       BSA:          2.18 m Patient Age:    66 years        BP:           115/64 mmHg Patient Gender: M               HR:           68 bpm. Exam Location:  [facility] Procedure: 2D Echo Indications:    Bacteremia 790.7 / R78.81   History:        Patient has prior history of Echocardiogram examinations, most                 recent 01/19/2012. CAD, Prior CABG, Signs/Symptoms:Bacteremia;                 Risk Factors:Dyslipidemia, Hypertension, Diabetes and                 Non-Smoker. S/P TAVR (transcatheter aortic valve replacement.  Sonographer:    Leavy Cella RDCS (AE) Referring Phys: [phone] Royanne Foots [place]  1. There is a ventricular septal bounce.. Left ventricular ejection fraction, by estimation, is 60 to 65%. The left ventricle has normal function. The left ventrical has no regional wall motion abnormalities. There is moderately increased left ventricular hypertrophy. Left ventricular diastolic parameters are indeterminate.  2. Right ventricular systolic function is mildly reduced. The right ventricular size is mildly enlarged. There is moderately elevated pulmonary artery systolic pressure.  3. Left atrial size was severely dilated.  4. Right atrial size was mildly dilated.  5. The mitral valve is normal in structure and function. Mild mitral valve regurgitation. No evidence of mitral stenosis.  6. Edwards 26 mm Sapien 3 Ultra valve in the AV position. Mean gradient slightly above reported normals for valve, slightly above prior mean gradient of 15 mmHg from 10/18/2018 echo. Mild difference would not be considered significant. . The aortic valve is normal in structure and function. Aortic valve regurgitation is not visualized. FINDINGS  Left Ventricle: There is a ventricular septal bounce. Left ventricular ejection fraction, by estimation, is 60 to 65%. The left ventricle has normal function. The left ventricle has no regional wall motion abnormalities. There is moderately increased left ventricular hypertrophy. Left ventricular diastolic parameters are indeterminate. Right Ventricle: The right ventricular size is mildly enlarged. Right vetricular wall thickness was not assessed. Right ventricular systolic function is mildly  reduced. There is moderately elevated pulmonary artery systolic pressure. The tricuspid regurgitant velocity is 2.85 m/s, and with an assumed right atrial pressure of 10 mmHg, the estimated right ventricular  systolic pressure is 09.6 mmHg. Left Atrium: Left atrial size was severely dilated. Right Atrium: Right atrial size was mildly dilated. Pericardium: There is no evidence of pericardial effusion. Mitral Valve: The mitral valve is normal in structure and function. Mild mitral valve regurgitation. No evidence of mitral valve stenosis. Tricuspid Valve: The tricuspid valve is normal in structure. Tricuspid valve regurgitation is mild . No evidence of tricuspid stenosis. Aortic Valve: Edwards 26 mm Sapien 3 Ultra valve in the AV position. Mean gradient slightly above reported normals for valve, slightly above prior mean gradient of 15 mmHg from 10/18/2018 echo. Mild difference would not be considered significant. The aortic valve is normal in structure and function. Aortic valve regurgitation is not visualized. Aortic valve mean gradient measures 17.8 mmHg. Aortic valve peak gradient measures 31.6 mmHg. Pulmonic Valve: The pulmonic valve was not well visualized. Pulmonic valve regurgitation is not visualized. No evidence of pulmonic stenosis. Aorta: The aortic root is normal in size and structure. Pulmonary Artery: Indeterminate PASP, poorly visualized IVC. Venous: The inferior vena cava was not well visualized. IAS/Shunts: No atrial level shunt detected by color flow Doppler.  LEFT VENTRICLE PLAX 2D LVIDd:         4.24 cm Diastology LVIDs:         2.85 cm LV e' lateral:   7.83 cm/s LV PW:         1.20 cm LV E/e' lateral: 12.0 LV IVS:        1.17 cm LV e' medial:    5.98 cm/s LV SV Index:   21.71   LV E/e' medial:  15.7  RIGHT VENTRICLE RV S prime:     9.68 cm/s TAPSE (M-mode): 1.1 cm LEFT ATRIUM           Index       RIGHT ATRIUM           Index LA diam:      4.30 cm 1.97 cm/m  RA Area:     18.90 cm LA Vol (A2C):  86.8 ml 39.74 ml/m RA Volume:   50.30 ml  23.03 ml/m LA Vol (A4C): 97.1 ml 44.46 ml/m  AORTIC VALVE AV Vmax:           280.89 cm/s AV Vmean:          199.593 cm/s AV VTI:            0.595 m AV Peak Grad:      31.6 mmHg AV Mean Grad:      17.8 mmHg LVOT Vmax:         79.67 cm/s LVOT Vmean:        56.372 cm/s LVOT VTI:          0.156 m LVOT/AV VTI ratio: 0.26  AORTA Ao Root diam: 3.20 cm MITRAL VALVE                        TRICUSPID VALVE MV Area (PHT): 3.03 cm             TR Peak grad:   32.5 mmHg MV Decel Time: 250 msec             TR Vmax:        285.00 cm/s MV E velocity: 93.60 cm/s 103 cm/s MV A velocity: 34.00 cm/s 70.3 cm/s SHUNTS MV E/A ratio:  2.75       1.5       Systemic VTI: 0.16 m Carlyle Dolly MD  Electronically signed by Carlyle Dolly MD Signature Date/Time: 05/24/2019/2:18:21 PM    Final         Scheduled Meds: . amLODipine  10 mg Oral Daily  . aspirin  81 mg Oral Daily  . baclofen  10 mg Oral TID  . Chlorhexidine Gluconate Cloth  6 each Topical Daily  . cholecalciferol  2,000 Units Oral Daily  . clopidogrel  75 mg Oral Daily  . enoxaparin (LOVENOX) injection  40 mg Subcutaneous Q24H  . escitalopram  10 mg Oral Daily  . fenofibrate  160 mg Oral Daily  . furosemide  40 mg Intravenous BID  . gabapentin  1,200 mg Oral TID  . insulin aspart  0-20 Units Subcutaneous TID WC  . insulin aspart  0-5 Units Subcutaneous QHS  . insulin aspart  4 Units Subcutaneous TID WC  . insulin glargine  30 Units Subcutaneous BID  . isosorbide mononitrate  30 mg Oral Daily  . levothyroxine  50 mcg Oral Q0600  . pantoprazole  40 mg Oral Daily  . potassium chloride  40 mEq Oral TID  . pravastatin  40 mg Oral Daily   Continuous Infusions: . ampicillin-sulbactam (UNASYN) IV 3 g (05/26/19 1057)     LOS: 3 days    Time spent: 30 minutes    Nelda Luckey Darleen Crocker, DO [facility] Pager [phone]  If 7PM-7AM, please contact night-coverage [email or web address] Password [email or web address] 05/26/2019, 11:01  AM

## 2019-05-26 NOTE — Progress Notes (Signed)
Pharmacy Antibiotic Note  Larry Hart is a 74 y.o. male admitted on 05/22/2019 with sepsis.  Pharmacy has been consulted for unasyn dosing.  Plan: Continue Unasyn 3gm IV q6h F/U cxs and clinical progress Monitor V/S, labs, and TEE results on 2/15.  Height: 5\' 9"  (175.3 cm) Weight: 225 lb 8.5 oz (102.3 kg) IBW/kg (Calculated) : 70.7  Temp (24hrs), Avg:99 F (37.2 C), Min:98.3 F (36.8 C), Max:100.1 F (37.8 C)  Recent Labs  Lab 05/22/19 1952 05/22/19 2046 05/22/19 2209 05/23/19 0055 05/23/19 0742 05/23/19 0742 05/23/19 1047 05/23/19 1328 05/23/19 1822 05/24/19 0434 05/25/19 0412 05/26/19 0720  WBC 7.2  --   --   --  9.3  --   --   --   --  9.4 10.0 7.1  CREATININE 1.67*  --   --    < > 1.59*   < > 1.56* 1.41* 1.35* 1.34* 1.35*  --   LATICACIDVEN  --  2.4* 2.3*  --   --   --   --   --   --   --   --   --    < > = values in this interval not displayed.    Estimated Creatinine Clearance: 58.3 mL/min (A) (by C-G formula based on SCr of 1.35 mg/dL (H)).    Allergies  Allergen Reactions  . Honey Bee Treatment [Bee Venom]   . Other Palpitations and Rash    Histalet, tears up nerves,possible rash    Antimicrobials this admission: unasyn 2/10>> Vancomycin 2/9 >> 2/10 Cefepime 2/9 >> 2/10    Microbiology results: 2/9 BCx:  Enterococcus species NOT DETECTED DETECTEDAbnormal    Comment: CRITICAL RESULT CALLED TO, READ BACK BY AND VERIFIED WITH:  PHARMD J COFFEE [id number] AT 1410 BY CM   Vancomycin resistance NOT DETECTED NOT DETECTED   Listeria monocytogenes NOT DETECTED NOT DETECTED   Staphylococcus species NOT DETECTED DETECTEDAbnormal    Comment: CRITICAL RESULT CALLED TO, READ BACK BY AND VERIFIED WITH:  PHARMD J COFFEE [id number] AT 1410 BY CM   Staphylococcus aureus (BCID) NOT DETECTED DETECTEDAbnormal    Comment: Methicillin (oxacillin) susceptible Staphylococcus aureus (MSSA). Preferred therapy is anti staphylococcal beta lactam antibiotic (Cefazolin or  Nafcillin), unless clinically contraindicated.  CRITICAL RESULT CALLED TO, READ BACK BY AND VERIFIED WITH:  PHARMD J COFFEE [id number] AT 1410 BY CM    2/9 BCx: BCID + Enterococcus and MSSA  2/9 UCx: ng 2/9 Influenza and SARS-2 CV is negative 2/9 MRSA PCR: negative  Thank you for allowing pharmacy to be a part of this patient's care.  Margot Ables, PharmD Clinical Pharmacist 05/26/2019 8:34 AM

## 2019-05-27 LAB — AEROBIC CULTURE W GRAM STAIN (SUPERFICIAL SPECIMEN)

## 2019-05-27 LAB — CBC
HCT: 36.2 % — ABNORMAL LOW (ref 39.0–52.0)
Hemoglobin: 12.1 g/dL — ABNORMAL LOW (ref 13.0–17.0)
MCH: 30.7 pg (ref 26.0–34.0)
MCHC: 33.4 g/dL (ref 30.0–36.0)
MCV: 91.9 fL (ref 80.0–100.0)
Platelets: 237 10*3/uL (ref 150–400)
RBC: 3.94 MIL/uL — ABNORMAL LOW (ref 4.22–5.81)
RDW: 12.7 % (ref 11.5–15.5)
WBC: 7.2 10*3/uL (ref 4.0–10.5)
nRBC: 0 % (ref 0.0–0.2)

## 2019-05-27 LAB — MAGNESIUM: Magnesium: 1.8 mg/dL (ref 1.7–2.4)

## 2019-05-27 LAB — BASIC METABOLIC PANEL
Anion gap: 11 (ref 5–15)
BUN: 31 mg/dL — ABNORMAL HIGH (ref 8–23)
CO2: 30 mmol/L (ref 22–32)
Calcium: 8.5 mg/dL — ABNORMAL LOW (ref 8.9–10.3)
Chloride: 97 mmol/L — ABNORMAL LOW (ref 98–111)
Creatinine, Ser: 1.22 mg/dL (ref 0.61–1.24)
GFR calc Af Amer: >60 mL/min (ref >60)
GFR calc non Af Amer: 59 mL/min — ABNORMAL LOW (ref >60)
Glucose, Bld: 182 mg/dL — ABNORMAL HIGH (ref 70–99)
Potassium: 3.1 mmol/L — ABNORMAL LOW (ref 3.5–5.1)
Sodium: 138 mmol/L (ref 135–145)

## 2019-05-27 LAB — GLUCOSE, CAPILLARY
Glucose-Capillary: 169 mg/dL — ABNORMAL HIGH (ref 70–99)
Glucose-Capillary: 155 mg/dL — ABNORMAL HIGH (ref 70–99)
Glucose-Capillary: 131 mg/dL — ABNORMAL HIGH (ref 70–99)
Glucose-Capillary: 160 mg/dL — ABNORMAL HIGH (ref 70–99)

## 2019-05-27 MED ORDER — SODIUM CHLORIDE 0.9 % IV SOLN
1.0000 g | INTRAVENOUS | Status: DC
  Administered 2019-05-27 – 2019-05-29 (×3): 1 g via INTRAVENOUS
  Filled 2019-05-27 (×3): qty 10

## 2019-05-27 MED ORDER — POTASSIUM CHLORIDE CRYS ER 20 MEQ PO TBCR
40.0000 meq | EXTENDED_RELEASE_TABLET | Freq: Three times a day (TID) | ORAL | Status: AC
  Administered 2019-05-27 (×3): 40 meq via ORAL
  Filled 2019-05-27 (×3): qty 2

## 2019-05-27 MED ORDER — FUROSEMIDE 10 MG/ML IJ SOLN
40.0000 mg | Freq: Two times a day (BID) | INTRAMUSCULAR | Status: DC
  Administered 2019-05-27 – 2019-05-30 (×6): 40 mg via INTRAVENOUS
  Filled 2019-05-27 (×7): qty 4

## 2019-05-27 NOTE — Progress Notes (Signed)
PROGRESS NOTE    Larry Hart  WCB:[id number] DOB: 11-Jan-1946 DOA: 05/22/2019 PCP: Larry Percy, MD   Brief Narrative:  Per HPI: Larry Hart a73 y.o.male,with history of diabetes mellitus type 2, hypertension, dyslipidemia, peripheral vascular disease, diabetic foot ulcer,s/p right transmetatarsal amputation who was brought to the hospital by patient's wife with chief complaints of vomiting since Sunday, generalized weakness. Patient has also been confused intermittently, and not talking like his usual self as per wife. Patient has diabetic foot ulcer and was seen by vascular surgery, he had cast on right foot which was removed last week. At that time the ulcer was looking improved. However today in the ED patient has serosanguineous discharge from the ulcer and right foot. Patient empirically started on vancomycin and cefepime for infected diabetic foot ulcer. No history of chest pain or shortness of breath. Patient has fever of 103.6. Blood pressure is stable, he is tachypneic.  2/10: Patient has been admitted with sepsis from infected diabetic foot ulcer and was started on vancomycin and cefepime. He is noted to have MSSA bacteremia for which he has now been transitioned to Unasyn. 2D echocardiogram has been ordered with results pending. He remains on IV insulin drip as well as D5 fluids as beta hydroxybutyric acid remains elevated. We will continue to monitor and likely transition to a diet in the morning once DKA pathology has resolved. AKI is slowly improving. Potassium is being supplemented. We will plan to repeat blood cultures in a.m.  2/11:Appreciate ID input. Transthoracic echocardiogram currently pending. Repeat blood cultures pending as well. Continue Unasyn for MSSA and enterococcal bacteremia. We will plan to keep n.p.o. after midnight for TEE in a.m. Patient does have history of bioprosthetic aortic valve and therefore, he is high risk for  endocarditis.  2/12:Patient had developed acute flash pulmonary edema yesterday afternoon for which IV fluids were discontinued and Lasix was given. Chest x-ray confirming the same with elevation in BNP noted. He is no longer in respiratory distress and has been urinating with over 4 L of diuresis overnight. Additional dose of Lasix given this morning and will repeat this evening as well. Continue potassium supplementation and monitor carefully. TEE canceled for today and will be performed on Monday. Continue on Unasyn. Wean oxygen as tolerated.  2/13: Patient continues to diurese well on his current Lasix dose.  Oxygen requirements are decreasing, but he is still on 10 L nasal cannula.  No respiratory distress noted.  Some agitation noted overnight.  2/14: Patient continues to remain on 2 L nasal cannula oxygen with no respiratory distress he has diuresed another 3 L in the last 24 hours.  Creatinine continues to remain stable.  We will continue with ongoing Lasix diuresis and potassium supplementation with repeat BMP monitoring.  N.p.o. after midnight for anticipated TEE in a.m.  Assessment & Plan:   Principal Problem:   Bacteremia due to methicillin susceptible Staphylococcus aureus (MSSA) Active Problems:   Hypertension   Diabetes mellitus ([place])   Sepsis ([place])   DKA (diabetic ketoacidoses) (HCC)   CKD (chronic kidney disease) stage 3, GFR 30-59 ml/min   CAD (coronary artery disease)   S/P CABG (coronary artery bypass graft)   PAD (peripheral artery disease) (HCC)   Dyslipidemia   Hypothyroid   S/P TAVR (transcatheter aortic valve replacement)   S/P transmetatarsal amputation of foot, right (HCC)   Diabetic foot ulcer (HCC)   Normocytic anemia   Thrombocytopenia (HCC)   Enterococcal bacteremia   Sepsis secondary to MSSA and  enterococcal bacteremia from infected diabetic foot ulcer -Patient was initially on vancomycin and cefepime, but has been transitioned to Unasyn given  findings on BC ID -Continue wound care -Sepsis physiology appears to have resolved and he is stable for transfer to general medical floor at this time -Transthoracic echocardiogram without signs of endocarditis.LVEF 60-65%. TEE canceled due to hypoxemia and will plan for study to be done on 2/15. -MRI without findings of osteomyelitis or fluid collection in need of I&D on right foot  DKA secondary to above-blood glucose elevated -Increase sliding scale insulin coverage today  Acute hypoxemic respiratory failure secondary to flash pulmonary edema-improving -Monitor I's and O's with adequate diuresis noted -Continue on Lasix IV twice daily and continue to diurese with stable creatinine levels -Wean oxygen as tolerated  AKI on CKD stage III withhypokalemia -Monitor closely and replete potassium levels -Potassium dosing to 3 times daily  Peripheral arterial disease with right transmetatarsal amputation -Continue on aspirin and Plavix  History of TAVR and bioprosthetic aortic valve -Transthoracic echocardiogram without signs of vegetations noted. LVEF 60-65%. -Patient will need TEE by Monday.  Dyslipidemia -Continue fenofibrate and pravastatin  History of hypertension -Currently controlled -Maintain on amlodipine -Monitor carefully with aggressive diuresis  Hypothyroidism -Continue levothyroxine  Anxiety/agitation -Xanax as needed helping  DVT prophylaxis:Lovenox Code Status:DNR Family Communication:Discussed with wife at bedside 2/12 Disposition Plan:Continue on Unasyn and diuresis. TEE for Monday.   Consultants:  Cardiology  Procedures:  See imaging below  Antimicrobials:  Anti-infectives (From admission, onward)   Start     Dose/Rate Route Frequency Ordered Stop   05/23/19 1600  Ampicillin-Sulbactam (UNASYN) 3 g in sodium chloride 0.9 % 100 mL IVPB     3 g 200 mL/hr over 30 Minutes Intravenous Every 6 hours 05/23/19 1521     05/23/19  1000  vancomycin (VANCOREADY) IVPB 750 mg/150 mL  Status:  Discontinued     750 mg 150 mL/hr over 60 Minutes Intravenous Every 12 hours 05/23/19 0741 05/23/19 0757   05/23/19 0900  ceFEPIme (MAXIPIME) 2 g in sodium chloride 0.9 % 100 mL IVPB  Status:  Discontinued     2 g 200 mL/hr over 30 Minutes Intravenous Every 12 hours 05/23/19 0741 05/23/19 1521   05/22/19 2030  ceFEPIme (MAXIPIME) 2 g in sodium chloride 0.9 % 100 mL IVPB     2 g 200 mL/hr over 30 Minutes Intravenous  Once 05/22/19 2011 05/22/19 2156   05/22/19 2030  vancomycin (VANCOREADY) IVPB 2000 mg/400 mL     2,000 mg 200 mL/hr over 120 Minutes Intravenous  Once 05/22/19 2026 05/23/19 0013   05/22/19 2015  metroNIDAZOLE (FLAGYL) IVPB 500 mg     500 mg 100 mL/hr over 60 Minutes Intravenous  Once 05/22/19 2011 05/22/19 2207   05/22/19 2015  vancomycin (VANCOCIN) IVPB 1000 mg/200 mL premix  Status:  Discontinued     1,000 mg 200 mL/hr over 60 Minutes Intravenous  Once 05/22/19 2011 05/22/19 2026       Subjective: Patient seen and evaluated today with no new acute complaints or concerns. No acute concerns or events noted overnight.  He continues to diurese quite well and has had no significant agitation since using Xanax as needed  Objective: Vitals:   05/26/19 0544 05/26/19 1543 05/26/19 2110 05/27/19 0529  BP: (!) 152/72  (!) 150/71 (!) 156/64  Pulse: 63  (!) 59 (!) 58  Resp: 19  18 17   Temp: 98.6 F (37 C)  98.4 F (36.9 C) 97.8 F (  36.6 C)  TempSrc:      SpO2: 95% (!) 88% 97% 93%  Weight:      Height:        Intake/Output Summary (Last 24 hours) at 05/27/2019 1153 Last data filed at 05/27/2019 0932 Gross per 24 hour  Intake 960 ml  Output 4100 ml  Net -3140 ml   Filed Weights   05/22/19 2023 05/24/19 0500 05/25/19 0530  Weight: 99 kg 103.4 kg 102.3 kg    Examination:  General exam: Appears calm and comfortable  Respiratory system: Clear to auscultation. Respiratory effort normal.  Currently on 2 L  nasal cannula oxygen. Cardiovascular system: S1 & S2 heard, RRR. No JVD, murmurs, rubs, gallops or clicks. No pedal edema. Gastrointestinal system: Abdomen is nondistended, soft and nontender. No organomegaly or masses felt. Normal bowel sounds heard. Central nervous system: Alert and oriented. No focal neurological deficits. Extremities: Symmetric 5 x 5 power. Skin: No rashes, lesions or ulcers Psychiatry: Judgement and insight appear normal. Mood & affect appropriate.     Data Reviewed: I have personally reviewed following labs and imaging studies  CBC: Recent Labs  Lab 05/23/19 0742 05/24/19 0434 05/25/19 0412 05/26/19 0720 05/27/19 0658  WBC 9.3 9.4 10.0 7.1 7.2  HGB 11.9* 12.3* 12.3* 11.7* 12.1*  HCT 35.9* 38.9* 37.0* 35.5* 36.2*  MCV 95.7 96.5 91.8 92.0 91.9  PLT 115* 123* 151 179 355   Basic Metabolic Panel: Recent Labs  Lab 05/23/19 1047 05/23/19 1328 05/23/19 1822 05/24/19 0434 05/25/19 0412 05/26/19 0720 05/27/19 0658  NA 134*   < > 135 131* 135 138 138  K 3.0*   < > 3.0* 3.1* 3.3* 3.0* 3.1*  CL 101   < > 103 103 99 97* 97*  CO2 20*   < > 22 22 25 28 30   GLUCOSE 217*   < > 141* 138* 263* 160* 182*  BUN 52*   < > 52* 49* 39* 33* 31*  CREATININE 1.56*   < > 1.35* 1.34* 1.35* 1.24 1.22  CALCIUM 7.9*   < > 8.0* 7.8* 8.1* 8.3* 8.5*  MG 2.2  --   --   --  2.1 2.0 1.8   < > = values in this interval not displayed.   GFR: Estimated Creatinine Clearance: 64.5 mL/min (by C-G formula based on SCr of 1.22 mg/dL). Liver Function Tests: Recent Labs  Lab 05/22/19 1952 05/23/19 0742  AST 23 34  ALT 19 21  ALKPHOS 53 41  BILITOT 1.7* 0.9  PROT 7.7 6.5  ALBUMIN 3.7 3.2*   No results for input(s): LIPASE, AMYLASE in the last 168 hours. No results for input(s): AMMONIA in the last 168 hours. Coagulation Profile: Recent Labs  Lab 05/22/19 1952  INR 1.2   Cardiac Enzymes: No results for input(s): CKTOTAL, CKMB, CKMBINDEX, TROPONINI in the last 168 hours. BNP  (last 3 results) No results for input(s): PROBNP in the last 8760 hours. HbA1C: No results for input(s): HGBA1C in the last 72 hours. CBG: Recent Labs  Lab 05/26/19 1100 05/26/19 1607 05/26/19 2112 05/27/19 0759 05/27/19 1115  GLUCAP 228* 186* 245* 169* 155*   Lipid Profile: No results for input(s): CHOL, HDL, LDLCALC, TRIG, CHOLHDL, LDLDIRECT in the last 72 hours. Thyroid Function Tests: No results for input(s): TSH, T4TOTAL, FREET4, T3FREE, THYROIDAB in the last 72 hours. Anemia Panel: No results for input(s): VITAMINB12, FOLATE, FERRITIN, TIBC, IRON, RETICCTPCT in the last 72 hours. Sepsis Labs: Recent Labs  Lab 05/22/19 2046 05/22/19 2209  LATICACIDVEN 2.4* 2.3*    Recent Results (from the past 240 hour(s))  Urine culture     Status: None   Collection Time: 05/22/19  7:52 PM   Specimen: In/Out Cath Urine  Result Value Ref Range Status   Specimen Description   Final    IN/OUT CATH URINE Performed at [facility], [address] [place], [place] [address]    Special Requests   Final    NONE Performed at [facility], [address], [place], [place] [address]    Culture   Final    NO GROWTH Performed at [facility], [place] [address], [place], [place] [address]    Report Status 05/24/2019 FINAL  Final  Blood Culture (routine x 2)     Status: Abnormal   Collection Time: 05/22/19  8:42 PM   Specimen: BLOOD  Result Value Ref Range Status   Specimen Description   Final    BLOOD RIGHT ANTECUBITAL Performed at [facility], [address], [place], [place] [address]    Special Requests   Final    BOTTLES DRAWN AEROBIC AND ANAEROBIC Blood Culture adequate volume Performed at [place] [place], [place] [address]    Culture  Setup Time   Final    GRAM POSITIVE COCCI IN BOTH AEROBIC AND ANAEROBIC BOTTLES Gram Stain Report Called to,Read Back By and Verified With: MCMILLIAN B. AT 0920A ON [id number] BY THOMPSON S. CRITICAL RESULT CALLED TO, READ  BACK BY AND VERIFIED WITH: PHARMD J COFFEE [id number] AT 1410 BY CM Performed at [facility], [place] [address] [place], [place] [address]    Culture STAPHYLOCOCCUS AUREUS ENTEROCOCCUS FAECALIS  (A)  Final   Report Status 05/26/2019 FINAL  Final   Organism ID, Bacteria STAPHYLOCOCCUS AUREUS  Final   Organism ID, Bacteria ENTEROCOCCUS FAECALIS  Final      Susceptibility   Enterococcus faecalis - MIC*    AMPICILLIN <=2 SENSITIVE Sensitive     VANCOMYCIN 1 SENSITIVE Sensitive     GENTAMICIN SYNERGY SENSITIVE Sensitive     * ENTEROCOCCUS FAECALIS   Staphylococcus aureus - MIC*    CIPROFLOXACIN <=0.5 SENSITIVE Sensitive     ERYTHROMYCIN <=0.25 SENSITIVE Sensitive     GENTAMICIN <=0.5 SENSITIVE Sensitive     OXACILLIN <=0.25 SENSITIVE Sensitive     TETRACYCLINE <=1 SENSITIVE Sensitive     VANCOMYCIN 1 SENSITIVE Sensitive     TRIMETH/SULFA <=10 SENSITIVE Sensitive     CLINDAMYCIN <=0.25 SENSITIVE Sensitive     RIFAMPIN <=0.5 SENSITIVE Sensitive     Inducible Clindamycin NEGATIVE Sensitive     * STAPHYLOCOCCUS AUREUS  Blood Culture ID Panel (Reflexed)     Status: Abnormal   Collection Time: 05/22/19  8:42 PM  Result Value Ref Range Status   Enterococcus species DETECTED (A) NOT DETECTED Final    Comment: CRITICAL RESULT CALLED TO, READ BACK BY AND VERIFIED WITH: PHARMD J COFFEE [id number] AT 1410 BY CM    Vancomycin resistance NOT DETECTED NOT DETECTED Final   Listeria monocytogenes NOT DETECTED NOT DETECTED Final   Staphylococcus species DETECTED (A) NOT DETECTED Final    Comment: CRITICAL RESULT CALLED TO, READ BACK BY AND VERIFIED WITH: PHARMD J COFFEE [id number] AT 1410 BY CM    Staphylococcus aureus (BCID) DETECTED (A) NOT DETECTED Final    Comment: Methicillin (oxacillin) susceptible Staphylococcus aureus (MSSA). Preferred therapy is anti staphylococcal beta lactam antibiotic (Cefazolin or Nafcillin), unless clinically contraindicated. CRITICAL RESULT CALLED TO, READ  BACK BY AND  VERIFIED WITH: PHARMD J COFFEE [id number] AT 32 BY CM    Methicillin resistance NOT DETECTED NOT DETECTED Final   Streptococcus species NOT DETECTED NOT DETECTED Final   Streptococcus agalactiae NOT DETECTED NOT DETECTED Final   Streptococcus pneumoniae NOT DETECTED NOT DETECTED Final   Streptococcus pyogenes NOT DETECTED NOT DETECTED Final   Acinetobacter baumannii NOT DETECTED NOT DETECTED Final   Enterobacteriaceae species NOT DETECTED NOT DETECTED Final   Enterobacter cloacae complex NOT DETECTED NOT DETECTED Final   Escherichia coli NOT DETECTED NOT DETECTED Final   Klebsiella oxytoca NOT DETECTED NOT DETECTED Final   Klebsiella pneumoniae NOT DETECTED NOT DETECTED Final   Proteus species NOT DETECTED NOT DETECTED Final   Serratia marcescens NOT DETECTED NOT DETECTED Final   Haemophilus influenzae NOT DETECTED NOT DETECTED Final   Neisseria meningitidis NOT DETECTED NOT DETECTED Final   Pseudomonas aeruginosa NOT DETECTED NOT DETECTED Final   Candida albicans NOT DETECTED NOT DETECTED Final   Candida glabrata NOT DETECTED NOT DETECTED Final   Candida krusei NOT DETECTED NOT DETECTED Final   Candida parapsilosis NOT DETECTED NOT DETECTED Final   Candida tropicalis NOT DETECTED NOT DETECTED Final    Comment: Performed at [facility], [place]. [address] [place], [place] [address]  Blood Culture (routine x 2)     Status: Abnormal   Collection Time: 05/22/19  8:46 PM   Specimen: BLOOD RIGHT HAND  Result Value Ref Range Status   Specimen Description   Final    BLOOD RIGHT HAND Performed at [facility], [address], [place], [place] [address]    Special Requests   Final    BOTTLES DRAWN AEROBIC AND ANAEROBIC Blood Culture adequate volume Performed at [place]., [place], [place] [address]    Culture  Setup Time   Final    GRAM POSITIVE COCCI IN BOTH AEROBIC AND ANAEROBIC BOTTLES Gram Stain Report Called to,Read Back By and Verified With: MCMILLIAN B. AT  0920A ON [id number] BY THOMPSON S. Performed at [facility], [address] [place], [place] [address]    Culture (A)  Final    STAPHYLOCOCCUS AUREUS ENTEROCOCCUS FAECALIS BOTH ORGANISMS HAVE HAD SUSCEPTIBILITIES PERFORMED ON PREVIOUS CULTURE WITHIN THE LAST 5 DAYS. Performed at [facility], [place] [address] [place], [place] [address]    Report Status 05/26/2019 FINAL  Final  Respiratory Panel by RT PCR (Flu A&B, Covid) - Nasopharyngeal Swab     Status: None   Collection Time: 05/22/19  9:37 PM   Specimen: Nasopharyngeal Swab  Result Value Ref Range Status   SARS Coronavirus 2 by RT PCR NEGATIVE NEGATIVE Final    Comment: (NOTE) SARS-CoV-2 target nucleic acids are NOT DETECTED. The SARS-CoV-2 RNA is generally detectable in upper respiratoy specimens during the acute phase of infection. The lowest concentration of SARS-CoV-2 viral copies this assay can detect is 131 copies/mL. A negative result does not preclude SARS-Cov-2 infection and should not be used as the sole basis for treatment or other patient management decisions. A negative result may occur with  improper specimen collection/handling, submission of specimen other than nasopharyngeal swab, presence of viral mutation(s) within the areas targeted by this assay, and inadequate number of viral copies (<131 copies/mL). A negative result must be combined with clinical observations, patient history, and epidemiological information. The expected result is Negative. Fact Sheet for Patients:  PinkCheek.be Fact Sheet for Healthcare Providers:  GravelBags.it This test is not yet ap  proved or cleared by the [place] and  has been authorized for detection and/or diagnosis of SARS-CoV-2 by FDA under an Emergency Use Authorization (EUA). This EUA will remain  in effect (meaning this test can be used) for the duration of the COVID-19 declaration under Section 564(b)(1)  of the Act, 21 U.S.C. section 360bbb-3(b)(1), unless the authorization is terminated or revoked sooner.    Influenza A by PCR NEGATIVE NEGATIVE Final   Influenza B by PCR NEGATIVE NEGATIVE Final    Comment: (NOTE) The Xpert Xpress SARS-CoV-2/FLU/RSV assay is intended as an aid in  the diagnosis of influenza from Nasopharyngeal swab specimens and  should not be used as a sole basis for treatment. Nasal washings and  aspirates are unacceptable for Xpert Xpress SARS-CoV-2/FLU/RSV  testing. Fact Sheet for Patients: PinkCheek.be Fact Sheet for Healthcare Providers: GravelBags.it This test is not yet approved or cleared by the [place] FDA and  has been authorized for detection and/or diagnosis of SARS-CoV-2 by  FDA under an Emergency Use Authorization (EUA). This EUA will remain  in effect (meaning this test can be used) for the duration of the  Covid-19 declaration under Section 564(b)(1) of the Act, 21  U.S.C. section 360bbb-3(b)(1), unless the authorization is  terminated or revoked. Performed at [facility], [address] [place], [place] [address]   MRSA PCR Screening     Status: None   Collection Time: 05/23/19  4:32 AM   Specimen: Nasal Mucosa; Nasopharyngeal  Result Value Ref Range Status   MRSA by PCR NEGATIVE NEGATIVE Final    Comment:        The GeneXpert MRSA Assay (FDA approved for NASAL specimens only), is one component of a comprehensive MRSA colonization surveillance program. It is not intended to diagnose MRSA infection nor to guide or monitor treatment for MRSA infections. Performed at [facility], [address], [place], [place] [address]   Culture, blood (routine x 2)     Status: None (Preliminary result)   Collection Time: 05/24/19  7:30 AM   Specimen: BLOOD LEFT HAND  Result Value Ref Range Status   Specimen Description BLOOD LEFT HAND  Final   Special Requests   Final    BOTTLES DRAWN  AEROBIC AND ANAEROBIC Blood Culture results may not be optimal due to an inadequate volume of blood received in culture bottles   Culture   Final    NO GROWTH 3 DAYS Performed at [facility], [address] [place], [place] [address]    Report Status PENDING  Incomplete  Culture, blood (routine x 2)     Status: None (Preliminary result)   Collection Time: 05/24/19  7:30 AM   Specimen: BLOOD RIGHT HAND  Result Value Ref Range Status   Specimen Description BLOOD RIGHT HAND  Final   Special Requests   Final    BOTTLES DRAWN AEROBIC AND ANAEROBIC Blood Culture results may not be optimal due to an inadequate volume of blood received in culture bottles   Culture   Final    NO GROWTH 3 DAYS Performed at [facility], [address], [place], [place] [address]    Report Status PENDING  Incomplete  Anaerobic culture     Status: None (Preliminary result)   Collection Time: 05/24/19  5:41 PM   Specimen: Heel  Result Value Ref Range Status   Specimen Description   Final    HEEL Performed at [facility], [address], [place], [place] [address]  Special Requests   Final    NONE Performed at [facility], [address], [place], [place] [address]    Culture   Final    NO ANAEROBES ISOLATED; CULTURE IN PROGRESS FOR 5 DAYS   Report Status PENDING  Incomplete  Aerobic Culture (superficial specimen)     Status: None (Preliminary result)   Collection Time: 05/24/19  5:41 PM   Specimen: Heel  Result Value Ref Range Status   Specimen Description HEEL  Final   Special Requests NONE  Final   Gram Stain   Final    ABUNDANT WBC PRESENT, PREDOMINANTLY PMN ABUNDANT GRAM POSITIVE COCCI FEW GRAM POSITIVE RODS RARE GRAM NEGATIVE RODS    Culture   Final    FEW STAPHYLOCOCCUS AUREUS FEW SERRATIA MARCESCENS SUSCEPTIBILITIES TO FOLLOW Performed at [facility], [place] [address], [place], [place] [address]    Report Status PENDING  Incomplete         Radiology Studies: No results  found.      Scheduled Meds: . amLODipine  10 mg Oral Daily  . aspirin  81 mg Oral Daily  . baclofen  10 mg Oral TID  . Chlorhexidine Gluconate Cloth  6 each Topical Daily  . cholecalciferol  2,000 Units Oral Daily  . clopidogrel  75 mg Oral Daily  . enoxaparin (LOVENOX) injection  40 mg Subcutaneous Q24H  . escitalopram  10 mg Oral Daily  . fenofibrate  160 mg Oral Daily  . furosemide  40 mg Intravenous Q12H  . gabapentin  1,200 mg Oral TID  . insulin aspart  0-20 Units Subcutaneous TID WC  . insulin aspart  0-5 Units Subcutaneous QHS  . insulin aspart  4 Units Subcutaneous TID WC  . insulin glargine  30 Units Subcutaneous BID  . isosorbide mononitrate  30 mg Oral Daily  . levothyroxine  50 mcg Oral Q0600  . pantoprazole  40 mg Oral Daily  . potassium chloride  40 mEq Oral TID  . pravastatin  40 mg Oral Daily   Continuous Infusions: . ampicillin-sulbactam (UNASYN) IV 3 g (05/27/19 0326)     LOS: 4 days    Time spent: 30 minutes    Johniece Hornbaker Darleen Crocker, DO [facility] Pager [phone]  If 7PM-7AM, please contact night-coverage [email or web address] Password [facility] 05/27/2019, 11:53 AM

## 2019-05-28 ENCOUNTER — Encounter (HOSPITAL_COMMUNITY)
Admission: EM | Disposition: A | Discharge status: Home Health | Payer: Self-pay | Source: Home / Self Care | Attending: Internal Medicine

## 2019-05-28 ENCOUNTER — Inpatient Hospital Stay (HOSPITAL_COMMUNITY): Payer: Medicare Other | Admitting: Anesthesiology

## 2019-05-28 ENCOUNTER — Inpatient Hospital Stay (HOSPITAL_COMMUNITY): Payer: Medicare Other

## 2019-05-28 DIAGNOSIS — I340 Nonrheumatic mitral (valve) insufficiency: Secondary | ICD-10-CM

## 2019-05-28 DIAGNOSIS — I361 Nonrheumatic tricuspid (valve) insufficiency: Secondary | ICD-10-CM

## 2019-05-28 HISTORY — PX: TEE WITHOUT CARDIOVERSION: SHX5443

## 2019-05-28 LAB — GLUCOSE, CAPILLARY
Glucose-Capillary: 97 mg/dL (ref 70–99)
Glucose-Capillary: 94 mg/dL (ref 70–99)
Glucose-Capillary: 85 mg/dL (ref 70–99)
Glucose-Capillary: 117 mg/dL — ABNORMAL HIGH (ref 70–99)
Glucose-Capillary: 216 mg/dL — ABNORMAL HIGH (ref 70–99)

## 2019-05-28 LAB — BASIC METABOLIC PANEL
Anion gap: 11 (ref 5–15)
BUN: 27 mg/dL — ABNORMAL HIGH (ref 8–23)
CO2: 30 mmol/L (ref 22–32)
Calcium: 8.8 mg/dL — ABNORMAL LOW (ref 8.9–10.3)
Chloride: 97 mmol/L — ABNORMAL LOW (ref 98–111)
Creatinine, Ser: 1.2 mg/dL (ref 0.61–1.24)
GFR calc Af Amer: >60 mL/min (ref >60)
GFR calc non Af Amer: >60 mL/min (ref >60)
Glucose, Bld: 120 mg/dL — ABNORMAL HIGH (ref 70–99)
Potassium: 3.5 mmol/L (ref 3.5–5.1)
Sodium: 138 mmol/L (ref 135–145)

## 2019-05-28 LAB — SURGICAL PCR SCREEN
MRSA, PCR: NEGATIVE
Staphylococcus aureus: POSITIVE — AB

## 2019-05-28 LAB — MAGNESIUM: Magnesium: 1.8 mg/dL (ref 1.7–2.4)

## 2019-05-28 SURGERY — ECHOCARDIOGRAM, TRANSESOPHAGEAL
Anesthesia: General

## 2019-05-28 MED ORDER — PROMETHAZINE HCL 25 MG/ML IJ SOLN: 6.2500 mg | INTRAMUSCULAR | Status: DC | PRN

## 2019-05-28 MED ORDER — HYDROMORPHONE HCL 1 MG/ML IJ SOLN: 0.2500 mg | INTRAMUSCULAR | Status: DC | PRN

## 2019-05-28 MED ORDER — KETAMINE HCL 10 MG/ML IJ SOLN
INTRAMUSCULAR | Status: DC | PRN
  Administered 2019-05-28: 10 mg via INTRAVENOUS
  Administered 2019-05-28: 20 mg via INTRAVENOUS

## 2019-05-28 MED ORDER — HYDROCODONE-ACETAMINOPHEN 7.5-325 MG PO TABS: 1.0000 | ORAL_TABLET | Freq: Once | ORAL | Status: DC | PRN

## 2019-05-28 MED ORDER — MAGNESIUM SULFATE IN D5W 1-5 GM/100ML-% IV SOLN
1.0000 g | Freq: Once | INTRAVENOUS | Status: AC
  Administered 2019-05-28: 1 g via INTRAVENOUS
  Filled 2019-05-28: qty 100

## 2019-05-28 MED ORDER — LACTATED RINGERS IV SOLN: INTRAVENOUS | Status: DC

## 2019-05-28 MED ORDER — KETAMINE HCL 50 MG/5ML IJ SOSY
PREFILLED_SYRINGE | INTRAMUSCULAR | Status: AC
  Filled 2019-05-28: qty 5

## 2019-05-28 MED ORDER — MUPIROCIN 2 % EX OINT
1.0000 "application " | TOPICAL_OINTMENT | Freq: Two times a day (BID) | CUTANEOUS | Status: DC
  Administered 2019-05-28 – 2019-05-30 (×5): 1 via NASAL
  Filled 2019-05-28: qty 22

## 2019-05-28 MED ORDER — PROPOFOL 10 MG/ML IV BOLUS
INTRAVENOUS | Status: DC | PRN
  Administered 2019-05-28: 40 mg via INTRAVENOUS

## 2019-05-28 MED ORDER — PROPOFOL 500 MG/50ML IV EMUL
INTRAVENOUS | Status: DC | PRN
  Administered 2019-05-28: 150 ug/kg/min via INTRAVENOUS

## 2019-05-28 MED ORDER — POTASSIUM CHLORIDE CRYS ER 20 MEQ PO TBCR
40.0000 meq | EXTENDED_RELEASE_TABLET | Freq: Two times a day (BID) | ORAL | Status: AC
  Administered 2019-05-28 – 2019-05-29 (×3): 40 meq via ORAL
  Filled 2019-05-28 (×3): qty 2

## 2019-05-28 NOTE — Transfer of Care (Signed)
Immediate Anesthesia Transfer of Care Note  Patient: Larry Hart  Procedure(s) Performed: TRANSESOPHAGEAL ECHOCARDIOGRAM (TEE) WITH PROPOFOL (N/A )  Patient Location: PACU  Anesthesia Type:General  Level of Consciousness: awake, alert  and patient cooperative  Airway & Oxygen Therapy: Patient Spontanous Breathing and Patient connected to nasal cannula oxygen  Post-op Assessment: Report given to RN and Post -op Vital signs reviewed and stable  Post vital signs: Reviewed and stable  Last Vitals:  Vitals Value Taken Time  BP    Temp 97.8   Pulse 49 05/28/19 1338  Resp 13 05/28/19 1338  SpO2 93 % 05/28/19 1338  Vitals shown include unvalidated device data.  Last Pain:  Vitals:   05/28/19 1100  TempSrc:   PainSc: 0-No pain      Patients Stated Pain Goal: 0 (71/16/57 9038)  Complications: No apparent anesthesia complications

## 2019-05-28 NOTE — Anesthesia Preprocedure Evaluation (Addendum)
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 (+) Missing   Pulmonary sleep apnea and Continuous Positive Airway Pressure Ventilation ,  Recent pulm edema- TEE postponed -continues on 2l o2 [place]- appears to have no current dyspnea on 2L Reports OSA with CPAP use qhs  Denies any home o2 use in the past   Pulmonary exam normal breath sounds clear to auscultation       Cardiovascular Exercise Tolerance: Good hypertension, Pt. on medications + CAD and + Peripheral Vascular Disease  Normal cardiovascular examI Rhythm:Regular Rate:Normal  Reports good ET + PAD/ s/p TMA S/p TAVR in 10/2018   Neuro/Psych negative neurological ROS  negative psych ROS   GI/Hepatic negative GI ROS, Neg liver ROS,   Endo/Other  diabetes, Type 1, Insulin DependentHypothyroidism   Renal/GU Renal InsufficiencyRenal disease  negative genitourinary   Musculoskeletal negative musculoskeletal ROS (+)   Abdominal   Peds negative pediatric ROS (+)  Hematology negative hematology ROS (+) anemia ,   Anesthesia Other Findings   Reproductive/Obstetrics negative OB ROS                             Anesthesia Physical Anesthesia Plan  ASA: III  Anesthesia Plan: General   Post-op Pain Management:    Induction: Intravenous  PONV Risk Score and Plan: 2 and Propofol infusion and TIVA  Airway Management Planned: Nasal Cannula and Simple Face Mask  Additional Equipment:   Intra-op Plan:   Post-operative Plan:   Informed Consent: I have reviewed the patients History and Physical,  chart, labs and discussed the procedure including the risks, benefits and alternatives for the proposed anesthesia with the patient or authorized representative who has indicated his/her understanding and acceptance.   Patient has DNR.  Discussed DNR with patient and Suspend DNR.   Dental advisory given  Plan Discussed with: CRNA  Anesthesia Plan Comments: (Plan Full PPE use  Plan GA with GETA as needed d/w pt -WTP with same after Q&A  D/w pt will suspend DNR in periprocedural time frame - d/w pt unlikely need for post procedural Ventilation  Voiced understanding -WTP -DNR conversation Witnessed by Laqueta Due RN)       Anesthesia Quick Evaluation

## 2019-05-28 NOTE — Progress Notes (Signed)
PROGRESS NOTE    Larry Hart  OIZ:[id number] DOB: 06-06-1945 DOA: 05/22/2019 PCP: Rory Percy, MD   Brief Narrative:  Per HPI: Larry Hart a74 y.o.male,with history of diabetes mellitus type 2, hypertension, dyslipidemia, peripheral vascular disease, diabetic foot ulcer,s/p right transmetatarsal amputation who was brought to the hospital by patient's wife with chief complaints of vomiting since Sunday, generalized weakness. Patient has also been confused intermittently, and not talking like his usual self as per wife. Patient has diabetic foot ulcer and was seen by vascular surgery, he had cast on right foot which was removed last week. At that time the ulcer was looking improved. However today in the ED patient has serosanguineous discharge from the ulcer and right foot. Patient empirically started on vancomycin and cefepime for infected diabetic foot ulcer. No history of chest pain or shortness of breath. Patient has fever of 103.6. Blood pressure is stable, he is tachypneic.  2/10: Patient has been admitted with sepsis from infected diabetic foot ulcer and was started on vancomycin and cefepime. He is noted to have MSSA bacteremia for which he has now been transitioned to Unasyn. 2D echocardiogram has been ordered with results pending. He remains on IV insulin drip as well as D5 fluids as beta hydroxybutyric acid remains elevated. We will continue to monitor and likely transition to a diet in the morning once DKA pathology has resolved. AKI is slowly improving. Potassium is being supplemented. We will plan to repeat blood cultures in a.m.  2/11:Appreciate ID input. Transthoracic echocardiogram currently pending. Repeat blood cultures pending as well. Continue Unasyn for MSSA and enterococcal bacteremia. We will plan to keep n.p.o. after midnight for TEE in a.m. Patient does have history of bioprosthetic aortic valve and therefore, he is high risk for  endocarditis.  2/12:Patient had developed acute flash pulmonary edema yesterday afternoon for which IV fluids were discontinued and Lasix was given. Chest x-ray confirming the same with elevation in BNP noted. He is no longer in respiratory distress and has been urinating with over 4 L of diuresis overnight. Additional dose of Lasix given this morning and will repeat this evening as well. Continue potassium supplementation and monitor carefully. TEE canceled for today and will be performed on Monday. Continue on Unasyn. Wean oxygen as tolerated.  2/13: Patient continues to diurese well on his current Lasix dose. Oxygen requirements are decreasing, but he is still on 10 L nasal cannula. No respiratory distress noted. Some agitation noted overnight.  2/14: Patient continues to remain on 2 L nasal cannula oxygen with no respiratory distress he has diuresed another 3 L in the last 24 hours.  Creatinine continues to remain stable.  We will continue with ongoing Lasix diuresis and potassium supplementation with repeat BMP monitoring.  N.p.o. after midnight for anticipated TEE in a.m.  2/15: Patient has undergone TEE today with no findings of endocarditis noted.  Continue on Unasyn and Rocephin.  Appreciate ID recommendations by a.m.  Continue diuresis for now and wean oxygen.  He has diuresed approximately 3 L overnight.  PT evaluation ordered.  Diet reinitiated.  Assessment & Plan:   Principal Problem:   Bacteremia due to methicillin susceptible Staphylococcus aureus (MSSA) Active Problems:   Hypertension   Diabetes mellitus ([place])   Sepsis ([place])   DKA (diabetic ketoacidoses) (HCC)   CKD (chronic kidney disease) stage 3, GFR 30-59 ml/min   CAD (coronary artery disease)   S/P CABG (coronary artery bypass graft)   PAD (peripheral artery disease) (HCC)   Dyslipidemia  Hypothyroid   S/P TAVR (transcatheter aortic valve replacement)   S/P transmetatarsal amputation of foot, right  (HCC)   Diabetic foot ulcer (HCC)   Normocytic anemia   Thrombocytopenia (HCC)   Enterococcal bacteremia   Sepsis secondary to MSSA and enterococcal bacteremia from infected diabetic foot ulcer-improving -Patient was initially on vancomycin and cefepime, but has been transitioned to Unasyn given findings on BC ID -Continue wound care -Sepsis physiology appears to have resolved and he is stable for transfer to general medical floor at this time -Transthoracic echocardiogram without signs of endocarditis.LVEF 60-65%. TEE canceleddue to hypoxemiaand will plan for study to be done on 2/15. -MRI without findings of osteomyelitis or fluid collection in need of I&D on right foot  DKA secondary to above-blood glucose improved -Continue current coverage  Acute hypoxemic respiratory failure secondary to flash pulmonary edema-improving -Monitor I's and O'swith adequate diuresis noted -Continue on Lasix IV twice daily andcontinue todiurese with stable creatinine levels -Wean oxygen as tolerated  AKI on CKD stage III withhypokalemia-improved -Monitor closely and replete potassium levels -Potassium dosing to 2 times daily  Peripheral arterial disease with right transmetatarsal amputation -Continue on aspirin and Plavix  History of TAVR and bioprosthetic aortic valve -Transthoracic echocardiogram without signs of vegetations noted. LVEF 60-65%. -Patient will need TEE by Monday.  Dyslipidemia -Continue fenofibrate and pravastatin  History of hypertension -Currently controlled -Maintain on amlodipine -Monitor carefully with aggressive diuresis  Hypothyroidism -Continue levothyroxine  Anxiety/agitation -Xanax as needed helping  DVT prophylaxis:Lovenox Code Status:DNR Family Communication:Discussed with wife at bedside 2/15 Disposition Plan:Continue on Unasyn and Rocephin.  Continue ongoing diuresis with weaning of oxygen.  TEE with no signs of  endocarditis.   Consultants:  Cardiology  ID  Procedures:  See imaging below  Antimicrobials:  Anti-infectives (From admission, onward)   Start     Dose/Rate Route Frequency Ordered Stop   05/27/19 1600  cefTRIAXone (ROCEPHIN) 1 g in sodium chloride 0.9 % 100 mL IVPB     1 g 200 mL/hr over 30 Minutes Intravenous Every 24 hours 05/27/19 1547     05/23/19 1600  Ampicillin-Sulbactam (UNASYN) 3 g in sodium chloride 0.9 % 100 mL IVPB     3 g 200 mL/hr over 30 Minutes Intravenous Every 6 hours 05/23/19 1521     05/23/19 1000  vancomycin (VANCOREADY) IVPB 750 mg/150 mL  Status:  Discontinued     750 mg 150 mL/hr over 60 Minutes Intravenous Every 12 hours 05/23/19 0741 05/23/19 0757   05/23/19 0900  ceFEPIme (MAXIPIME) 2 g in sodium chloride 0.9 % 100 mL IVPB  Status:  Discontinued     2 g 200 mL/hr over 30 Minutes Intravenous Every 12 hours 05/23/19 0741 05/23/19 1521   05/22/19 2030  ceFEPIme (MAXIPIME) 2 g in sodium chloride 0.9 % 100 mL IVPB     2 g 200 mL/hr over 30 Minutes Intravenous  Once 05/22/19 2011 05/22/19 2156   05/22/19 2030  vancomycin (VANCOREADY) IVPB 2000 mg/400 mL     2,000 mg 200 mL/hr over 120 Minutes Intravenous  Once 05/22/19 2026 05/23/19 0013   05/22/19 2015  metroNIDAZOLE (FLAGYL) IVPB 500 mg     500 mg 100 mL/hr over 60 Minutes Intravenous  Once 05/22/19 2011 05/22/19 2207   05/22/19 2015  vancomycin (VANCOCIN) IVPB 1000 mg/200 mL premix  Status:  Discontinued     1,000 mg 200 mL/hr over 60 Minutes Intravenous  Once 05/22/19 2011 05/22/19 2026       Subjective: Patient  seen and evaluated today with no new acute complaints or concerns. No acute concerns or events noted overnight.  He has undergone TEE with no findings of endocarditis noted.  Objective: Vitals:   05/28/19 1337 05/28/19 1345 05/28/19 1400 05/28/19 1415  BP: (!) 115/49  135/74   Pulse: (!) 51 (!) 51 (!) 57 (!) 57  Resp: 14 14 (!) 7 14  Temp: 97.8 F (36.6 C)     TempSrc:       SpO2: 98% 95% 99% 100%  Weight:      Height:        Intake/Output Summary (Last 24 hours) at 05/28/2019 1527 Last data filed at 05/28/2019 1323 Gross per 24 hour  Intake 1628.35 ml  Output 4650 ml  Net -3021.65 ml   Filed Weights   05/24/19 0500 05/25/19 0530 05/28/19 0624  Weight: 103.4 kg 102.3 kg 94.8 kg    Examination:  General exam: Appears calm and comfortable  Respiratory system: Clear to auscultation. Respiratory effort normal.  Remains on 2 L nasal cannula. Cardiovascular system: S1 & S2 heard, RRR. No JVD, murmurs, rubs, gallops or clicks. No pedal edema. Gastrointestinal system: Abdomen is nondistended, soft and nontender. No organomegaly or masses felt. Normal bowel sounds heard. Central nervous system: Alert and oriented. No focal neurological deficits. Extremities: Symmetric 5 x 5 power.  Right foot transmetatarsal amputation with wound and yellowish exudate. Skin: No rashes, lesions or ulcers Psychiatry: Judgement and insight appear normal. Mood & affect appropriate.     Data Reviewed: I have personally reviewed following labs and imaging studies  CBC: Recent Labs  Lab 05/23/19 0742 05/24/19 0434 05/25/19 0412 05/26/19 0720 05/27/19 0658  WBC 9.3 9.4 10.0 7.1 7.2  HGB 11.9* 12.3* 12.3* 11.7* 12.1*  HCT 35.9* 38.9* 37.0* 35.5* 36.2*  MCV 95.7 96.5 91.8 92.0 91.9  PLT 115* 123* 151 179 790   Basic Metabolic Panel: Recent Labs  Lab 05/23/19 1047 05/23/19 1328 05/24/19 0434 05/25/19 0412 05/26/19 0720 05/27/19 0658 05/28/19 0558  NA 134*   < > 131* 135 138 138 138  K 3.0*   < > 3.1* 3.3* 3.0* 3.1* 3.5  CL 101   < > 103 99 97* 97* 97*  CO2 20*   < > 22 25 28 30 30   GLUCOSE 217*   < > 138* 263* 160* 182* 120*  BUN 52*   < > 49* 39* 33* 31* 27*  CREATININE 1.56*   < > 1.34* 1.35* 1.24 1.22 1.20  CALCIUM 7.9*   < > 7.8* 8.1* 8.3* 8.5* 8.8*  MG 2.2  --   --  2.1 2.0 1.8 1.8   < > = values in this interval not displayed.   GFR: Estimated  Creatinine Clearance: 63.2 mL/min (by C-G formula based on SCr of 1.2 mg/dL). Liver Function Tests: Recent Labs  Lab 05/22/19 1952 05/23/19 0742  AST 23 34  ALT 19 21  ALKPHOS 53 41  BILITOT 1.7* 0.9  PROT 7.7 6.5  ALBUMIN 3.7 3.2*   No results for input(s): LIPASE, AMYLASE in the last 168 hours. No results for input(s): AMMONIA in the last 168 hours. Coagulation Profile: Recent Labs  Lab 05/22/19 1952  INR 1.2   Cardiac Enzymes: No results for input(s): CKTOTAL, CKMB, CKMBINDEX, TROPONINI in the last 168 hours. BNP (last 3 results) No results for input(s): PROBNP in the last 8760 hours. HbA1C: No results for input(s): HGBA1C in the last 72 hours. CBG: Recent Labs  Lab 05/27/19  1623 05/27/19 2228 05/28/19 0744 05/28/19 1117 05/28/19 1258  GLUCAP 131* 160* 97 94 85   Lipid Profile: No results for input(s): CHOL, HDL, LDLCALC, TRIG, CHOLHDL, LDLDIRECT in the last 72 hours. Thyroid Function Tests: No results for input(s): TSH, T4TOTAL, FREET4, T3FREE, THYROIDAB in the last 72 hours. Anemia Panel: No results for input(s): VITAMINB12, FOLATE, FERRITIN, TIBC, IRON, RETICCTPCT in the last 72 hours. Sepsis Labs: Recent Labs  Lab 05/22/19 2046 05/22/19 2209  LATICACIDVEN 2.4* 2.3*    Recent Results (from the past 240 hour(s))  Urine culture     Status: None   Collection Time: 05/22/19  7:52 PM   Specimen: In/Out Cath Urine  Result Value Ref Range Status   Specimen Description   Final    IN/OUT CATH URINE Performed at [facility], [address], [place], [place] [address]    Special Requests   Final    NONE Performed at [facility], [address], [place], [place] [address]    Culture   Final    NO GROWTH Performed at [facility], [place] [address] [place], [place] [address]    Report Status 05/24/2019 FINAL  Final  Blood Culture (routine x 2)     Status: Abnormal   Collection Time: 05/22/19  8:42 PM   Specimen: BLOOD  Result Value Ref Range Status    Specimen Description   Final    BLOOD RIGHT ANTECUBITAL Performed at [facility], [address], [place], [place] [address]    Special Requests   Final    BOTTLES DRAWN AEROBIC AND ANAEROBIC Blood Culture adequate volume Performed at [facility] [place], [place] [address]    Culture  Setup Time   Final    GRAM POSITIVE COCCI IN BOTH AEROBIC AND ANAEROBIC BOTTLES Gram Stain Report Called to,Read Back By and Verified With: MCMILLIAN B. AT 0920A ON [id number] BY THOMPSON S. CRITICAL RESULT CALLED TO, READ BACK BY AND VERIFIED WITH: PHARMD J COFFEE [id number] AT 1410 BY CM Performed at [facility], [place] [address], [place],  [address]    Culture STAPHYLOCOCCUS AUREUS ENTEROCOCCUS FAECALIS  (A)  Final   Report Status 05/26/2019 FINAL  Final   Organism ID, Bacteria STAPHYLOCOCCUS AUREUS  Final   Organism ID, Bacteria ENTEROCOCCUS FAECALIS  Final      Susceptibility   Enterococcus faecalis - MIC*    AMPICILLIN <=2 SENSITIVE Sensitive     VANCOMYCIN 1 SENSITIVE Sensitive     GENTAMICIN SYNERGY SENSITIVE Sensitive     * ENTEROCOCCUS FAECALIS   Staphylococcus aureus - MIC*    CIPROFLOXACIN <=0.5 SENSITIVE Sensitive     ERYTHROMYCIN <=0.25 SENSITIVE Sensitive     GENTAMICIN <=0.5 SENSITIVE Sensitive     OXACILLIN <=0.25 SENSITIVE Sensitive     TETRACYCLINE <=1 SENSITIVE Sensitive     VANCOMYCIN 1 SENSITIVE Sensitive     TRIMETH/SULFA <=10 SENSITIVE Sensitive     CLINDAMYCIN <=0.25 SENSITIVE Sensitive     RIFAMPIN <=0.5 SENSITIVE Sensitive     Inducible Clindamycin NEGATIVE Sensitive     * STAPHYLOCOCCUS AUREUS  Blood Culture ID Panel (Reflexed)     Status: Abnormal   Collection Time: 05/22/19  8:42 PM  Result Value Ref Range Status   Enterococcus species DETECTED (A) NOT DETECTED Final    Comment: CRITICAL RESULT CALLED TO, READ BACK BY AND VERIFIED WITH: PHARMD J COFFEE [id number] AT 1410 BY CM    Vancomycin resistance NOT DETECTED NOT DETECTED Final  Listeria monocytogenes NOT DETECTED NOT DETECTED Final   Staphylococcus species DETECTED (A) NOT DETECTED Final    Comment: CRITICAL RESULT CALLED TO, READ BACK BY AND VERIFIED WITH: PHARMD J COFFEE [id number] AT 1410 BY CM    Staphylococcus aureus (BCID) DETECTED (A) NOT DETECTED Final    Comment: Methicillin (oxacillin) susceptible Staphylococcus aureus (MSSA). Preferred therapy is anti staphylococcal beta lactam antibiotic (Cefazolin or Nafcillin), unless clinically contraindicated. CRITICAL RESULT CALLED TO, READ BACK BY AND VERIFIED WITH: PHARMD J COFFEE [id number] AT 67 BY CM    Methicillin resistance NOT DETECTED NOT DETECTED Final   Streptococcus species NOT DETECTED NOT DETECTED Final   Streptococcus agalactiae NOT DETECTED NOT DETECTED Final   Streptococcus pneumoniae NOT DETECTED NOT DETECTED Final   Streptococcus pyogenes NOT DETECTED NOT DETECTED Final   Acinetobacter baumannii NOT DETECTED NOT DETECTED Final   Enterobacteriaceae species NOT DETECTED NOT DETECTED Final   Enterobacter cloacae complex NOT DETECTED NOT DETECTED Final   Escherichia coli NOT DETECTED NOT DETECTED Final   Klebsiella oxytoca NOT DETECTED NOT DETECTED Final   Klebsiella pneumoniae NOT DETECTED NOT DETECTED Final   Proteus species NOT DETECTED NOT DETECTED Final   Serratia marcescens NOT DETECTED NOT DETECTED Final   Haemophilus influenzae NOT DETECTED NOT DETECTED Final   Neisseria meningitidis NOT DETECTED NOT DETECTED Final   Pseudomonas aeruginosa NOT DETECTED NOT DETECTED Final   Candida albicans NOT DETECTED NOT DETECTED Final   Candida glabrata NOT DETECTED NOT DETECTED Final   Candida krusei NOT DETECTED NOT DETECTED Final   Candida parapsilosis NOT DETECTED NOT DETECTED Final   Candida tropicalis NOT DETECTED NOT DETECTED Final    Comment: Performed at [facility], [address], [place], [place] [address]  Blood Culture (routine x 2)     Status: Abnormal   Collection Time: 05/22/19   8:46 PM   Specimen: BLOOD RIGHT HAND  Result Value Ref Range Status   Specimen Description   Final    BLOOD RIGHT HAND Performed at [facility], [address] [place], [place] [address]    Special Requests   Final    BOTTLES DRAWN AEROBIC AND ANAEROBIC Blood Culture adequate volume Performed at [facility] [place], [place] [address]    Culture  Setup Time   Final    GRAM POSITIVE COCCI IN BOTH AEROBIC AND ANAEROBIC BOTTLES Gram Stain Report Called to,Read Back By and Verified With: MCMILLIAN B. AT 0920A ON [id number] BY THOMPSON S. Performed at [facility], [address], [place], [place] [address]    Culture (A)  Final    STAPHYLOCOCCUS AUREUS ENTEROCOCCUS FAECALIS BOTH ORGANISMS HAVE HAD SUSCEPTIBILITIES PERFORMED ON PREVIOUS CULTURE WITHIN THE LAST 5 DAYS. Performed at [facility], [place] [address], [place], [place] [address]    Report Status 05/26/2019 FINAL  Final  Respiratory Panel by RT PCR (Flu A&B, Covid) - Nasopharyngeal Swab     Status: None   Collection Time: 05/22/19  9:37 PM   Specimen: Nasopharyngeal Swab  Result Value Ref Range Status   SARS Coronavirus 2 by RT PCR NEGATIVE NEGATIVE Final    Comment: (NOTE) SARS-CoV-2 target nucleic acids are NOT DETECTED. The SARS-CoV-2 RNA is generally detectable in upper respiratoy specimens during the acute phase of infection. The lowest concentration of SARS-CoV-2 viral copies this assay can detect is 131 copies/mL. A negative result does not preclude SARS-Cov-2 infection and should not be used as the sole basis for treatment or other  patient management decisions. A negative result may occur with  improper specimen collection/handling, submission of specimen other than nasopharyngeal swab, presence of viral mutation(s) within the areas targeted by this assay, and inadequate number of viral copies (<131 copies/mL). A negative result must be combined with clinical observations, patient history, and  epidemiological information. The expected result is Negative. Fact Sheet for Patients:  PinkCheek.be Fact Sheet for Healthcare Providers:  GravelBags.it This test is not yet ap proved or cleared by the [place] FDA and  has been authorized for detection and/or diagnosis of SARS-CoV-2 by FDA under an Emergency Use Authorization (EUA). This EUA will remain  in effect (meaning this test can be used) for the duration of the COVID-19 declaration under Section 564(b)(1) of the Act, 21 U.S.C. section 360bbb-3(b)(1), unless the authorization is terminated or revoked sooner.    Influenza A by PCR NEGATIVE NEGATIVE Final   Influenza B by PCR NEGATIVE NEGATIVE Final    Comment: (NOTE) The Xpert Xpress SARS-CoV-2/FLU/RSV assay is intended as an aid in  the diagnosis of influenza from Nasopharyngeal swab specimens and  should not be used as a sole basis for treatment. Nasal washings and  aspirates are unacceptable for Xpert Xpress SARS-CoV-2/FLU/RSV  testing. Fact Sheet for Patients: PinkCheek.be Fact Sheet for Healthcare Providers: GravelBags.it This test is not yet approved or cleared by the [place] FDA and  has been authorized for detection and/or diagnosis of SARS-CoV-2 by  FDA under an Emergency Use Authorization (EUA). This EUA will remain  in effect (meaning this test can be used) for the duration of the  Covid-19 declaration under Section 564(b)(1) of the Act, 21  U.S.C. section 360bbb-3(b)(1), unless the authorization is  terminated or revoked. Performed at [facility], [address] [place], [place] [address]   MRSA PCR Screening     Status: None   Collection Time: 05/23/19  4:32 AM   Specimen: Nasal Mucosa; Nasopharyngeal  Result Value Ref Range Status   MRSA by PCR NEGATIVE NEGATIVE Final    Comment:        The GeneXpert MRSA Assay (FDA approved for  NASAL specimens only), is one component of a comprehensive MRSA colonization surveillance program. It is not intended to diagnose MRSA infection nor to guide or monitor treatment for MRSA infections. Performed at [facility], [address], [place], [place] [address]   Culture, blood (routine x 2)     Status: None (Preliminary result)   Collection Time: 05/24/19  7:30 AM   Specimen: BLOOD LEFT HAND  Result Value Ref Range Status   Specimen Description BLOOD LEFT HAND  Final   Special Requests   Final    BOTTLES DRAWN AEROBIC AND ANAEROBIC Blood Culture results may not be optimal due to an inadequate volume of blood received in culture bottles   Culture   Final    NO GROWTH 4 DAYS Performed at [facility], [address], [place],  [address]    Report Status PENDING  Incomplete  Culture, blood (routine x 2)     Status: None (Preliminary result)   Collection Time: 05/24/19  7:30 AM   Specimen: BLOOD RIGHT HAND  Result Value Ref Range Status   Specimen Description BLOOD RIGHT HAND  Final   Special Requests   Final    BOTTLES DRAWN AEROBIC AND ANAEROBIC Blood Culture results may not be optimal due to an inadequate volume of blood received in culture bottles   Culture   Final  NO GROWTH 4 DAYS Performed at [facility], [address], [place], [place] [address]    Report Status PENDING  Incomplete  Anaerobic culture     Status: None (Preliminary result)   Collection Time: 05/24/19  5:41 PM   Specimen: Heel  Result Value Ref Range Status   Specimen Description   Final    HEEL Performed at [facility], [address] [place], [place] [address]    Special Requests   Final    NONE Performed at [facility], [address] [place], [place] [address]    Culture   Final    NO ANAEROBES ISOLATED; CULTURE IN PROGRESS FOR 5 DAYS   Report Status PENDING  Incomplete  Aerobic Culture (superficial specimen)     Status: None   Collection Time: 05/24/19  5:41 PM   Specimen:  Heel  Result Value Ref Range Status   Specimen Description HEEL  Final   Special Requests NONE  Final   Gram Stain   Final    ABUNDANT WBC PRESENT, PREDOMINANTLY PMN ABUNDANT GRAM POSITIVE COCCI FEW GRAM POSITIVE RODS RARE GRAM NEGATIVE RODS Performed at [facility], [place] [address], [place], [place] [address]    Culture   Final    FEW STAPHYLOCOCCUS AUREUS FEW SERRATIA MARCESCENS    Report Status 05/27/2019 FINAL  Final   Organism ID, Bacteria SERRATIA MARCESCENS  Final   Organism ID, Bacteria STAPHYLOCOCCUS AUREUS  Final      Susceptibility   Staphylococcus aureus - MIC*    CIPROFLOXACIN <=0.5 SENSITIVE Sensitive     ERYTHROMYCIN <=0.25 SENSITIVE Sensitive     GENTAMICIN <=0.5 SENSITIVE Sensitive     OXACILLIN <=0.25 SENSITIVE Sensitive     TETRACYCLINE <=1 SENSITIVE Sensitive     VANCOMYCIN 1 SENSITIVE Sensitive     TRIMETH/SULFA <=10 SENSITIVE Sensitive     CLINDAMYCIN <=0.25 SENSITIVE Sensitive     RIFAMPIN <=0.5 SENSITIVE Sensitive     Inducible Clindamycin NEGATIVE Sensitive     * FEW STAPHYLOCOCCUS AUREUS   Serratia marcescens - MIC*    CEFAZOLIN >=64 RESISTANT Resistant     CEFEPIME <=0.12 SENSITIVE Sensitive     CEFTAZIDIME <=1 SENSITIVE Sensitive     CEFTRIAXONE <=0.25 SENSITIVE Sensitive     CIPROFLOXACIN <=0.25 SENSITIVE Sensitive     GENTAMICIN <=1 SENSITIVE Sensitive     TRIMETH/SULFA <=20 SENSITIVE Sensitive     * FEW SERRATIA MARCESCENS  Surgical pcr screen     Status: Abnormal   Collection Time: 05/28/19  2:20 AM   Specimen: Nasal Mucosa; Nasal Swab  Result Value Ref Range Status   MRSA, PCR NEGATIVE NEGATIVE Final   Staphylococcus aureus POSITIVE (A) NEGATIVE Final    Comment: (NOTE) The Xpert SA Assay (FDA approved for NASAL specimens in patients 35 years of age and older), is one component of a comprehensive surveillance program. It is not intended to diagnose infection nor to guide or monitor treatment. Performed at [facility],  [address] [place], [place] [address]          Radiology Studies: No results found.      Scheduled Meds: . amLODipine  10 mg Oral Daily  . aspirin  81 mg Oral Daily  . baclofen  10 mg Oral TID  . Chlorhexidine Gluconate Cloth  6 each Topical Daily  . cholecalciferol  2,000 Units Oral Daily  . clopidogrel  75 mg Oral Daily  . enoxaparin (LOVENOX) injection  40 mg Subcutaneous Q24H  .  escitalopram  10 mg Oral Daily  . fenofibrate  160 mg Oral Daily  . furosemide  40 mg Intravenous Q12H  . gabapentin  1,200 mg Oral TID  . insulin aspart  0-20 Units Subcutaneous TID WC  . insulin aspart  0-5 Units Subcutaneous QHS  . insulin aspart  4 Units Subcutaneous TID WC  . insulin glargine  30 Units Subcutaneous BID  . isosorbide mononitrate  30 mg Oral Daily  . levothyroxine  50 mcg Oral Q0600  . mupirocin ointment  1 application Nasal BID  . pantoprazole  40 mg Oral Daily  . potassium chloride  40 mEq Oral BID  . pravastatin  40 mg Oral Daily   Continuous Infusions: . ampicillin-sulbactam (UNASYN) IV 3 g (05/28/19 1010)  . cefTRIAXone (ROCEPHIN)  IV Stopped (05/27/19 1712)     LOS: 5 days    Time spent: 30 minutes    Lillien Petronio Darleen Crocker, DO [facility] Pager [phone]  If 7PM-7AM, please contact night-coverage [email or web address] Password [facility] 05/28/2019, 3:27 PM

## 2019-05-28 NOTE — Progress Notes (Signed)
Pt will be taken down for TEE via transport. Interventions called to make sure the pt was prepped and ready. RN confirmed he was ready. Pt is room with spouse and denies any pain or discomfort at this time.

## 2019-05-28 NOTE — Anesthesia Procedure Notes (Signed)
Date/Time: 05/28/2019 1:00 PM Performed by: Vista Deck, CRNA Pre-anesthesia Checklist: Patient identified, Emergency Drugs available, Suction available, Timeout performed and Patient being monitored Patient Re-evaluated:Patient Re-evaluated prior to induction Oxygen Delivery Method: Non-rebreather mask

## 2019-05-28 NOTE — TOC Progression Note (Addendum)
Transition of Care [facility]) - Progression Note    Patient Details  Name: Larry Hart MRN: [id number] Date of Birth: [birth year]  Transition of Care [facility]) CM/SW Contact  Ihor Gully, LCSW Phone Number: 05/28/2019, 4:15 PM  Clinical Narrative:    Discussed with patient's wife, Mrs. Lawless, that Carolynn Sayers, with [facility], agreeable to completed IV abx. Pam with [facility] contacted [facility] to complete nursing note.    Expected Discharge Plan: Home/Self Care Barriers to Discharge: Continued Medical Work up  Expected Discharge Plan and Services Expected Discharge Plan: Home/Self Care       Living arrangements for the past 2 months: Single Family Home                           HH Arranged: RN, IV Antibiotics HH Agency: [facility] [place], [place]) Date [place]: 05/28/19 Time [place]: 1615 Representative spoke with at [place]: Carolynn Sayers, with [facility], contacted [facility] to arrange RN for IV abx.   Social Determinants of Health (SDOH) Interventions    Readmission Risk Interventions No flowsheet data found.

## 2019-05-28 NOTE — Progress Notes (Signed)
Transesophageal echocardiogram (preliminary report):  1.  Normal LV systolic function, LVEF 60 to 65%.  2.  Normal prosthetic aortic valve function.  No stenosis no regurgitation.  3.  Mild to moderate mitral vegetation.  4.  Mild tricuspid vegetation.  5.  No evidence of valvular vegetations.

## 2019-05-28 NOTE — Progress Notes (Signed)
Patient ID: Larry Hart, male   DOB: [birth year], 74 y.o.   MRN: [id number]          [facility] for Infectious Disease    Date of Admission:  05/22/2019    Total days of antibiotics 7        Day 6 ampicillin sulbactam        Day 2 ceftriaxone  Mr. Ledin has become afebrile and seems to be improving on therapy for MSSA and enterococcal bacteremia complicating a right diabetic foot infection. Wound drainage cultures grew MSSA and Serratia.  Repeat blood cultures are negative.  There was no evidence of endocarditis seen on TTE.  TEE is planned for today.  TEE results will help determine if he needs a single or double lumen PICC, what his antibiotic regimen will be and how long he will need to be on it.  I will follow-up tomorrow.  It would be helpful to have a picture of his right foot ulcer loaded under the media tab.         Michel Bickers, MD [facility] [phone] pager   [phone] cell 05/28/2019, 11:45 AM

## 2019-05-28 NOTE — Progress Notes (Signed)
CRITICAL VALUE ALERT  Critical Value: blood culture gram positive cocci  Date & Time Notied: 05/28/19 1940  Provider Notified: m denny  Orders Received/Actions taken: abx already odered. Treatment team previously aware

## 2019-05-28 NOTE — Progress Notes (Signed)
*  PRELIMINARY RESULTS* Echocardiogram Echocardiogram Transesophageal has been performed.  Larry Hart 05/28/2019, 2:35 PM

## 2019-05-28 NOTE — Anesthesia Postprocedure Evaluation (Signed)
Anesthesia Post Note  Patient: Larry Hart  Procedure(s) Performed: TRANSESOPHAGEAL ECHOCARDIOGRAM (TEE) WITH PROPOFOL (N/A )  Patient location during evaluation: PACU Anesthesia Type: General Level of consciousness: awake and alert and patient cooperative Pain management: satisfactory to patient Respiratory status: spontaneous breathing Cardiovascular status: stable Postop Assessment: no apparent nausea or vomiting Anesthetic complications: no     Last Vitals:  Vitals:   05/28/19 0624 05/28/19 1337  BP: (!) 142/62 (!) 115/49  Pulse: (!) 58 (!) 51  Resp: 16 14  Temp: 36.4 C (P) 36.6 C  SpO2: 100% 98%    Last Pain:  Vitals:   05/28/19 1100  TempSrc:   PainSc: 0-No pain                 Naomi Castrogiovanni

## 2019-05-28 NOTE — Progress Notes (Signed)
Positive surgical PCR for staph- notified dr a zierle-ghosh

## 2019-05-29 ENCOUNTER — Inpatient Hospital Stay: Payer: Self-pay

## 2019-05-29 LAB — CBC
HCT: 43.1 % (ref 39.0–52.0)
Hemoglobin: 14.2 g/dL (ref 13.0–17.0)
MCH: 30.4 pg (ref 26.0–34.0)
MCHC: 32.9 g/dL (ref 30.0–36.0)
MCV: 92.3 fL (ref 80.0–100.0)
Platelets: 328 10*3/uL (ref 150–400)
RBC: 4.67 MIL/uL (ref 4.22–5.81)
RDW: 12.9 % (ref 11.5–15.5)
WBC: 8.8 10*3/uL (ref 4.0–10.5)
nRBC: 0 % (ref 0.0–0.2)

## 2019-05-29 LAB — BASIC METABOLIC PANEL
Anion gap: 13 (ref 5–15)
BUN: 31 mg/dL — ABNORMAL HIGH (ref 8–23)
CO2: 26 mmol/L (ref 22–32)
Calcium: 9.1 mg/dL (ref 8.9–10.3)
Chloride: 97 mmol/L — ABNORMAL LOW (ref 98–111)
Creatinine, Ser: 1.3 mg/dL — ABNORMAL HIGH (ref 0.61–1.24)
GFR calc Af Amer: >60 mL/min (ref >60)
GFR calc non Af Amer: 55 mL/min — ABNORMAL LOW (ref >60)
Glucose, Bld: 240 mg/dL — ABNORMAL HIGH (ref 70–99)
Potassium: 3.6 mmol/L (ref 3.5–5.1)
Sodium: 136 mmol/L (ref 135–145)

## 2019-05-29 LAB — GLUCOSE, CAPILLARY
Glucose-Capillary: 193 mg/dL — ABNORMAL HIGH (ref 70–99)
Glucose-Capillary: 235 mg/dL — ABNORMAL HIGH (ref 70–99)
Glucose-Capillary: 176 mg/dL — ABNORMAL HIGH (ref 70–99)
Glucose-Capillary: 169 mg/dL — ABNORMAL HIGH (ref 70–99)

## 2019-05-29 LAB — ANAEROBIC CULTURE

## 2019-05-29 LAB — CULTURE, BLOOD (ROUTINE X 2): Culture: NO GROWTH

## 2019-05-29 LAB — MAGNESIUM: Magnesium: 2 mg/dL (ref 1.7–2.4)

## 2019-05-29 MED ORDER — PIPERACILLIN-TAZOBACTAM IV (FOR PTA / DISCHARGE USE ONLY): 13.5000 g | INTRAVENOUS | 0 refills | Status: AC

## 2019-05-29 MED ORDER — ALPRAZOLAM 0.5 MG PO TABS: 0.5000 mg | ORAL_TABLET | Freq: Once | ORAL | Status: DC

## 2019-05-29 NOTE — Plan of Care (Signed)
  Problem: Acute Rehab PT Goals(only PT should resolve) Goal: Patient Will Perform Sitting Balance Outcome: Progressing Flowsheets (Taken 05/29/2019 1016) Patient will perform sitting balance: Independently Goal: Patient Will Transfer Sit To/From Stand Outcome: Progressing Flowsheets (Taken 05/29/2019 1016) Patient will transfer sit to/from stand: with min guard assist Goal: Pt Will Transfer Bed To Chair/Chair To Bed Outcome: Progressing Flowsheets (Taken 05/29/2019 1016) Pt will Transfer Bed to Chair/Chair to Bed: min guard assist Goal: Pt Will Perform Standing Balance Or Pre-Gait Outcome: Progressing Flowsheets (Taken 05/29/2019 1016) Pt will perform standing balance or pre-gait: with Supervision Goal: Pt Will Ambulate Outcome: Progressing Flowsheets (Taken 05/29/2019 1016) Pt will Ambulate:  50 feet  with min guard assist  with least restrictive assistive device  10:17 AM, 05/29/19 Mearl Latin PT, DPT Physical Therapist at [facility]

## 2019-05-29 NOTE — Care Management Important Message (Signed)
Important Message  Patient Details  Name: Larry Hart MRN: [id number] Date of Birth: Sep 11, 1945   Medicare Important Message Given:  Yes     [name] 05/29/2019, 12:54 PM

## 2019-05-29 NOTE — Evaluation (Signed)
Physical Therapy Evaluation Patient Details Name: Larry Hart MRN: [id number] DOB: Aug 16, 1945 Today's Date: 05/29/2019   History of Present Illness  Larry Hart  is a 74 y.o. male, with history of diabetes mellitus type 2, hypertension, dyslipidemia, peripheral vascular disease, diabetic foot ulcer, s/p right transmetatarsal amputation who was brought to the hospital by patient's wife with chief complaints of vomiting since Sunday, generalized weakness.  Patient has also been confused intermittently, and not talking like his usual self as per wife.    Clinical Impression  Patient limited for functional mobility as stated below secondary to BLE weakness, fatigue and poor standing balance. Patient has drainage from R foot wound, RN asked PT to wrap with gauze before donning socks. Patient able to transition to seated EOB using bed rails for assist and bed mobility is slightly slow and labored. He becomes dizzy and nauseous while seated EOB which takes about 5-10 minutes to subside. Patient LE is overall weak bilaterally. He requires assist to transfer to standing and has c/o LE weakness with standing. He is limited to several short labored steps to ambulate to chair. Patient left in chair with RN present. Patient states wife will be able to assist him upon return home from hospital. Patient will benefit from continued physical therapy in hospital and recommended venue below to increase strength, balance, endurance for safe ADLs and gait.     Follow Up Recommendations Home health PT;Supervision for mobility/OOB    Equipment Recommendations  None recommended by PT    Recommendations for Other Services       Precautions / Restrictions Precautions Precautions: Fall Restrictions Weight Bearing Restrictions: No      Mobility  Bed Mobility Overal bed mobility: Needs Assistance Bed Mobility: Supine to Sit;Sit to Supine     Supine to sit: Supervision Sit to supine: Supervision   General  bed mobility comments: to transition to seated EOB, patient becomes nauseous and dizzy upon intial sitting, improves after 5-10 minutes of sitting EOB  Transfers Overall transfer level: Needs assistance Equipment used: Rolling walker (2 wheeled) Transfers: Sit to/from [facility] Sit to Stand: Mod assist Stand pivot transfers: Mod assist       General transfer comment: Requires mod assist and RW to transiton to standing x2 , demonstrates LE weakness upon sitting, min/mod verbal cueing for mechanics and use of RW  Ambulation/Gait Ambulation/Gait assistance: Min assist Gait Distance (Feet): 3 Feet Assistive device: Rolling walker (2 wheeled) Gait Pattern/deviations: Shuffle Gait velocity: decreased   General Gait Details: small, slow, labored steps at bedside to transfer to chair; Patient not feeling well today so he is limited to few feet in room  Stairs            Wheelchair Mobility    Modified Rankin (Stroke Patients Only)       Balance Overall balance assessment: Needs assistance Sitting-balance support: Bilateral upper extremity supported;Feet supported Sitting balance-Leahy Scale: Fair Sitting balance - Comments: required UE support to remain seated EOB   Standing balance support: Bilateral upper extremity supported Standing balance-Leahy Scale: Poor Standing balance comment: with use of RW                             Pertinent Vitals/Pain      Home Living Family/patient expects to be discharged to:: Private residence Living Arrangements: Spouse/significant other Available Help at Discharge: Family Type of Home: House Home Access: Stairs to enter Entrance Stairs-Rails: (hand  grip on wall) Entrance Stairs-Number of Steps: 2 Home Layout: Two level Home Equipment: Walker - 2 wheels;Bedside commode;Grab bars - tub/shower;Wheelchair - Press photographer;Shower seat;Cane - single point;Other (comment)(knee scooter)       Prior Function Level of Independence: Needs assistance   Gait / Transfers Assistance Needed: patient states limited community ambulator  ADL's / Homemaking Assistance Needed: wife assists        Hand Dominance        Extremity/Trunk Assessment   Upper Extremity Assessment Upper Extremity Assessment: Generalized weakness    Lower Extremity Assessment Lower Extremity Assessment: Generalized weakness    Cervical / Trunk Assessment Cervical / Trunk Assessment: Kyphotic  Communication   Communication: No difficulties  Cognition Arousal/Alertness: Awake/alert Behavior During Therapy: WFL for tasks assessed/performed Overall Cognitive Status: Within Functional Limits for tasks assessed                                        General Comments      Exercises     Assessment/Plan    PT Assessment Patient needs continued PT services  PT Problem List Decreased strength;Decreased mobility;Decreased safety awareness;Decreased activity tolerance;Decreased balance;Decreased knowledge of use of DME       PT Treatment Interventions DME instruction;Therapeutic exercise;Gait training;Balance training;Stair training;Neuromuscular re-education;Functional mobility training;Patient/family education;Therapeutic activities    PT Goals (Current goals can be found in the Care Plan section)  Acute Rehab PT Goals Patient Stated Goal: return home with wife to assist PT Goal Formulation: With patient Time For Goal Achievement: 06/13/19    Frequency Min 3X/week   Barriers to discharge        Co-evaluation               AM-PAC PT "6 Clicks" Mobility  Outcome Measure Help needed turning from your back to your side while in a flat bed without using bedrails?: None Help needed moving from lying on your back to sitting on the side of a flat bed without using bedrails?: None Help needed moving to and from a bed to a chair (including a wheelchair)?: A Little Help  needed standing up from a chair using your arms (e.g., wheelchair or bedside chair)?: A Little Help needed to walk in hospital room?: A Lot Help needed climbing 3-5 steps with a railing? : A Lot 6 Click Score: 18    End of Session Equipment Utilized During Treatment: Gait belt Activity Tolerance: Patient limited by fatigue;Patient tolerated treatment well Patient left: in chair;with chair alarm set;with nursing/sitter in room;with call bell/phone within reach Nurse Communication: Mobility status PT Visit Diagnosis: Unsteadiness on feet (R26.81);Other abnormalities of gait and mobility (R26.89);Muscle weakness (generalized) (M62.81)    Time: 5170-0174 PT Time Calculation (min) (ACUTE ONLY): 53 min   Charges:   PT Evaluation $PT Eval Moderate Complexity: 1 Mod PT Treatments $Therapeutic Activity: 38-52 mins        10:15 AM, 05/29/19 Mearl Latin PT, DPT Physical Therapist at [facility]

## 2019-05-29 NOTE — Discharge Summary (Addendum)
Physician Discharge Summary  Larry Hart ZOX:[id number] DOB: [birth year] DOA: 05/22/2019  PCP: Rory Percy, MD  Admit date: 05/22/2019  Discharge date: 05/29/2019  Admitted From:Home  Disposition:  Home  Recommendations for Outpatient Follow-up:  1. Follow up with PCP in 1-2 weeks 2. Follow-up with Dr. Megan Salon on 3/8 as recommended 3. Continue on IV Zosyn with home infusions through 3/8 for coverage of MSSA bacteremia as well as anaerobes present in the foot 4. Continue other home medications as prior to include chlorthalidone for volume management  Home Health: Yes with RN and PT  Equipment/Devices: None  Discharge Condition: Stable  CODE STATUS: DNR  Diet recommendation: Heart Healthy/carb modified with 2 L fluid restriction  Brief/Interim Summary: Per HPI: Larry Hart a73 y.o.male,with history of diabetes mellitus type 2, hypertension, dyslipidemia, peripheral vascular disease, diabetic foot ulcer,s/p right transmetatarsal amputation who was brought to the hospital by patient's wife with chief complaints of vomiting since Sunday, generalized weakness. Patient has also been confused intermittently, and not talking like his usual self as per wife. Patient has diabetic foot ulcer and was seen by vascular surgery, he had cast on right foot which was removed last week. At that time the ulcer was looking improved. However today in the ED patient has serosanguineous discharge from the ulcer and right foot. Patient empirically started on vancomycin and cefepime for infected diabetic foot ulcer. No history of chest pain or shortness of breath. Patient has fever of 103.6. Blood pressure is stable, he is tachypneic.  2/10: Patient has been admitted with sepsis from infected diabetic foot ulcer and was started on vancomycin and cefepime. He is noted to have MSSA bacteremia for which he has now been transitioned to Unasyn. 2D echocardiogram has been ordered with results  pending. He remains on IV insulin drip as well as D5 fluids as beta hydroxybutyric acid remains elevated. We will continue to monitor and likely transition to a diet in the morning once DKA pathology has resolved. AKI is slowly improving. Potassium is being supplemented. We will plan to repeat blood cultures in a.m.  2/11:Appreciate ID input. Transthoracic echocardiogram currently pending. Repeat blood cultures pending as well. Continue Unasyn for MSSA and enterococcal bacteremia. We will plan to keep n.p.o. after midnight for TEE in a.m. Patient does have history of bioprosthetic aortic valve and therefore, he is high risk for endocarditis.  2/12:Patient had developed acute flash pulmonary edema yesterday afternoon for which IV fluids were discontinued and Lasix was given. Chest x-ray confirming the same with elevation in BNP noted. He is no longer in respiratory distress and has been urinating with over 4 L of diuresis overnight. Additional dose of Lasix given this morning and will repeat this evening as well. Continue potassium supplementation and monitor carefully. TEE canceled for today and will be performed on Monday. Continue on Unasyn. Wean oxygen as tolerated.  2/13: Patient continues to diurese well on his current Lasix dose. Oxygen requirements are decreasing, but he is still on 10 L nasal cannula. No respiratory distress noted. Some agitation noted overnight.  2/14:Patient continues to remain on 2 L nasal cannula oxygen with no respiratory distress he has diuresed another 3 L in the last 24 hours. Creatinine continues to remain stable. We will continue with ongoing Lasix diuresis and potassium supplementation with repeat BMP monitoring. N.p.o. after midnight for anticipated TEE in a.m.  2/15: Patient has undergone TEE today with no findings of endocarditis noted.  Continue on Unasyn and Rocephin.  Appreciate ID recommendations by  a.m.  Continue diuresis for now  and wean oxygen.  He has diuresed approximately 3 L overnight.  PT evaluation ordered.  Diet reinitiated.  2/16: Patient has received PICC line and will discharge with IV Zosyn to continue through 3/8 per ID recommendations.  Will follow up with ID and have routine lab testing as ordered.  He has diuresed quite well and is off any further oxygen support this morning.  PT evaluation reveals need for home health physical therapy which will also be ordered along with home nursing to assist with IV infusions.  No other acute events noted throughout the course of this admission.  He is stable for discharge.  Discharge Diagnoses:  Principal Problem:   Bacteremia due to methicillin susceptible Staphylococcus aureus (MSSA) Active Problems:   Hypertension   Diabetes mellitus ([place])   Sepsis ([place])   DKA (diabetic ketoacidoses) ([facility])   CKD (chronic kidney disease) stage 3, GFR 30-59 ml/min   CAD (coronary artery disease)   S/P CABG (coronary artery bypass graft)   PAD (peripheral artery disease) ([facility])   Dyslipidemia   Hypothyroid   S/P TAVR (transcatheter aortic valve replacement)   S/P transmetatarsal amputation of foot, right (HCC)   Diabetic foot ulcer ([facility])   Normocytic anemia   Thrombocytopenia ([facility])   Enterococcal bacteremia  Principal discharge diagnosis: Sepsis secondary to MSSA and enterococcal bacteremia with anaerobic organisms present in diabetic foot ulcer.  Associated DKA.  Discharge Instructions  Discharge Instructions    Diet - low sodium heart healthy   Complete by: As directed    Home infusion instructions   Complete by: As directed    Instructions: Flushing of vascular access device: 0.9% NaCl pre/post medication administration and prn patency; Heparin 100 u/ml, 40m for implanted ports and Heparin 10u/ml, 529mfor all other central venous catheters.   Increase activity slowly   Complete by: As directed      Allergies as of 05/30/2019      Reactions   Honey Bee Treatment  [bee Venom]    Other Palpitations, Rash   Histalet, tears up nerves,possible rash      Medication List    STOP taking these medications   potassium chloride 10 MEQ tablet Commonly known as: KLOR-CON     TAKE these medications   amLODipine 10 MG tablet Commonly known as: NORVASC Take 10 mg by mouth daily.   aspirin 81 MG tablet Take 81 mg by mouth daily.   cetirizine 10 MG tablet Commonly known as: ZYRTEC Take 10 mg by mouth daily as needed for allergies.   chlorthalidone 25 MG tablet Commonly known as: HYGROTON Take 25 mg by mouth daily.   clopidogrel 75 MG tablet Commonly known as: PLAVIX Take 75 mg by mouth daily.   Coenzyme Q10 100 MG Tabs Take 1 tablet by mouth daily.   escitalopram 10 MG tablet Commonly known as: LEXAPRO Take 10 mg by mouth daily.   fenofibrate 160 MG tablet Take 160 mg by mouth daily.   gabapentin 600 MG tablet Commonly known as: NEURONTIN Take 1,200 mg by mouth 3 (three) times daily.   insulin glargine 100 UNIT/ML injection Commonly known as: LANTUS Inject 30 Units into the skin 2 (two) times daily.   isosorbide mononitrate 30 MG 24 hr tablet Commonly known as: IMDUR Take 30 mg by mouth daily.   ketoconazole 2 % cream Commonly known as: NIZORAL Apply 1 application topically daily as needed for irritation.   Klor-Con M20 20 MEQ tablet Generic drug:  potassium chloride SA Take 20 mEq by mouth 2 (two) times daily.   levothyroxine 50 MCG tablet Commonly known as: SYNTHROID Take 50 mcg by mouth daily before breakfast.   losartan 100 MG tablet Commonly known as: COZAAR Take 100 mg by mouth daily.   metoCLOPramide 10 MG tablet Commonly known as: REGLAN Take 10 mg by mouth every 8 (eight) hours as needed for nausea or vomiting.   multivitamin capsule Take 1 capsule by mouth daily. Adults 50+   Nitrostat 0.4 MG SL tablet Generic drug: nitroGLYCERIN Place 0.4 mg under the tongue every 5 (five) minutes as needed for chest  pain.   NovoLIN R ReliOn 100 units/mL injection Generic drug: insulin regular Inject 20-50 Units into the skin 3 (three) times daily before meals. Per sliding scale as directed per dietician   omega-3 acid ethyl esters 1 g capsule Commonly known as: LOVAZA Take 2 g by mouth 2 (two) times daily.   pantoprazole 40 MG tablet Commonly known as: PROTONIX Take 40 mg by mouth daily.   piperacillin-tazobactam  IVPB Commonly known as: ZOSYN Inject 13.5 g into the vein daily for 20 days. As a continuous infusion  Indication:  Bacteremia and diabetic foot infection Last Day of Therapy:  06/18/2019 Labs - Once weekly:  CBC/D and BMP, Labs - Every other week:  ESR and CRP   pravastatin 40 MG tablet Commonly known as: PRAVACHOL Take 40 mg by mouth daily.   vitamin C 1000 MG tablet Take 1,000 mg by mouth daily.   Vitamin D3 50 MCG (2000 UT) Tabs Take 1 tablet by mouth daily.            Home Infusion Instuctions  (From admission, onward)         Start     Ordered   05/29/19 0000  Home infusion instructions    Question:  Instructions  Answer:  Flushing of vascular access device: 0.9% NaCl pre/post medication administration and prn patency; Heparin 100 u/ml, 45m for implanted ports and Heparin 10u/ml, 539mfor all other central venous catheters.   05/29/19 1327         Follow-up Information    HoRory PercyMD Follow up in 1 week(s).   Specialty: Family Medicine Contact information: [phone]        CaMichel BickersMD Follow up in 3 week(s).   Specialty: Infectious Diseases Contact information: [address] [place] [phone]        Allergies  Allergen Reactions  . Honey Bee Treatment [Bee Venom]   . Other Palpitations and Rash    Histalet, tears up nerves,possible rash    Consultations:  Cardiology  ID   Procedures/Studies: DG Chest 1 View  Result Date: 05/24/2019 CLINICAL DATA:   Short of breath, hypoxemia EXAM: CHEST  1 VIEW COMPARISON:  05/22/2019 FINDINGS: Single frontal view of the chest demonstrates an enlarged cardiac silhouette. Stable median sternotomy and aortic valve prosthesis. Since the prior exam, increased central vascular congestion and perihilar airspace disease. Small left pleural effusion. No pneumothorax. IMPRESSION: 1. Findings consistent with pulmonary edema and congestive heart failure. Electronically Signed   By: MiRanda Ngo.D.   On: 05/24/2019 18:19   MR FOOT RIGHT WO CONTRAST  Result Date: 05/23/2019 CLINICAL DATA:  Osteomyelitis of the right foot. Serosanguineous just charge from the ulcer of the right foot. EXAM: MRI OF THE RIGHT FOREFOOT WITHOUT CONTRAST TECHNIQUE: Multiplanar, multisequence MR  imaging of the right forefoot was performed. No intravenous contrast was administered. COMPARISON:  None. FINDINGS: Bones/Joint/Cartilage Prior transmetatarsal amputation. No fracture or dislocation. Normal alignment. No joint effusion. Ligaments, Muscles and Tendons Flexor, peroneal and extensor compartment tendons are intact. Generalized muscle atrophy. Soft tissue No fluid collection or hematoma. No soft tissue mass. Soft tissue edema along the dorsal aspect of the foot which may be reactive versus secondary to mild cellulitis. IMPRESSION: 1. Prior transmetatarsal amputation. No osteomyelitis of the right foot. Electronically Signed   By: Kathreen Devoid   On: 05/23/2019 09:29   DG Chest Port 1 View  Result Date: 05/22/2019 CLINICAL DATA:  Sepsis. Fever and vomiting. EXAM: PORTABLE CHEST 1 VIEW COMPARISON:  Lung bases from abdominal CT yesterday at [facility]: Post median sternotomy. Prosthetic aortic valve. Mild cardiomegaly. No focal airspace disease, large pleural effusion, or pulmonary edema. No pneumothorax. No acute osseous abnormalities are seen. IMPRESSION: Mild cardiomegaly. No acute chest findings or evidence of pneumonia. Electronically  Signed   By: Keith Rake M.D.   On: 05/22/2019 20:38   ECHOCARDIOGRAM COMPLETE  Result Date: 05/24/2019    ECHOCARDIOGRAM REPORT   Patient Name:   Larry Hart Date of Exam: 05/24/2019 Medical Rec #:  [id number]       Height:       69.0 in Accession #:    [id number]      Weight:       228.0 lb Date of Birth:  [birth year]       BSA:          2.18 m Patient Age:    66 years        BP:           115/64 mmHg Patient Gender: M               HR:           68 bpm. Exam Location:  [facility] Procedure: 2D Echo Indications:    Bacteremia 790.7 / R78.81  History:        Patient has prior history of Echocardiogram examinations, most                 recent 01/19/2012. CAD, Prior CABG, Signs/Symptoms:Bacteremia;                 Risk Factors:Dyslipidemia, Hypertension, Diabetes and                 Non-Smoker. S/P TAVR (transcatheter aortic valve replacement.  Sonographer:    Leavy Cella RDCS (AE) Referring Phys: [phone] Royanne Foots [place]  1. There is a ventricular septal bounce.. Left ventricular ejection fraction, by estimation, is 60 to 65%. The left ventricle has normal function. The left ventrical has no regional wall motion abnormalities. There is moderately increased left ventricular hypertrophy. Left ventricular diastolic parameters are indeterminate.  2. Right ventricular systolic function is mildly reduced. The right ventricular size is mildly enlarged. There is moderately elevated pulmonary artery systolic pressure.  3. Left atrial size was severely dilated.  4. Right atrial size was mildly dilated.  5. The mitral valve is normal in structure and function. Mild mitral valve regurgitation. No evidence of mitral stenosis.  6. Edwards 26 mm Sapien 3 Ultra valve in the AV position. Mean gradient slightly above reported normals for valve, slightly above prior mean gradient of 15 mmHg from 10/18/2018 echo. Mild difference would not be considered significant. . The aortic valve is normal in structure and  function. Aortic valve regurgitation is not visualized. FINDINGS  Left Ventricle: There is a ventricular septal bounce. Left ventricular ejection fraction, by estimation, is 60 to 65%. The left ventricle has normal function. The left ventricle has no regional wall motion abnormalities. There is moderately increased left ventricular hypertrophy. Left ventricular diastolic parameters are indeterminate. Right Ventricle: The right ventricular size is mildly enlarged. Right vetricular wall thickness was not assessed. Right ventricular systolic function is mildly reduced. There is moderately elevated pulmonary artery systolic pressure. The tricuspid regurgitant velocity is 2.85 m/s, and with an assumed right atrial pressure of 10 mmHg, the estimated right ventricular systolic pressure is 81.1 mmHg. Left Atrium: Left atrial size was severely dilated. Right Atrium: Right atrial size was mildly dilated. Pericardium: There is no evidence of pericardial effusion. Mitral Valve: The mitral valve is normal in structure and function. Mild mitral valve regurgitation. No evidence of mitral valve stenosis. Tricuspid Valve: The tricuspid valve is normal in structure. Tricuspid valve regurgitation is mild . No evidence of tricuspid stenosis. Aortic Valve: Edwards 26 mm Sapien 3 Ultra valve in the AV position. Mean gradient slightly above reported normals for valve, slightly above prior mean gradient of 15 mmHg from 10/18/2018 echo. Mild difference would not be considered significant. The aortic valve is normal in structure and function. Aortic valve regurgitation is not visualized. Aortic valve mean gradient measures 17.8 mmHg. Aortic valve peak gradient measures 31.6 mmHg. Pulmonic Valve: The pulmonic valve was not well visualized. Pulmonic valve regurgitation is not visualized. No evidence of pulmonic stenosis. Aorta: The aortic root is normal in size and structure. Pulmonary Artery: Indeterminate PASP, poorly visualized IVC. Venous:  The inferior vena cava was not well visualized. IAS/Shunts: No atrial level shunt detected by color flow Doppler.  LEFT VENTRICLE PLAX 2D LVIDd:         4.24 cm Diastology LVIDs:         2.85 cm LV e' lateral:   7.83 cm/s LV PW:         1.20 cm LV E/e' lateral: 12.0 LV IVS:        1.17 cm LV e' medial:    5.98 cm/s LV SV Index:   21.71   LV E/e' medial:  15.7  RIGHT VENTRICLE RV S prime:     9.68 cm/s TAPSE (M-mode): 1.1 cm LEFT ATRIUM           Index       RIGHT ATRIUM           Index LA diam:      4.30 cm 1.97 cm/m  RA Area:     18.90 cm LA Vol (A2C): 86.8 ml 39.74 ml/m RA Volume:   50.30 ml  23.03 ml/m LA Vol (A4C): 97.1 ml 44.46 ml/m  AORTIC VALVE AV Vmax:           280.89 cm/s AV Vmean:          199.593 cm/s AV VTI:            0.595 m AV Peak Grad:      31.6 mmHg AV Mean Grad:      17.8 mmHg LVOT Vmax:         79.67 cm/s LVOT Vmean:        56.372 cm/s LVOT VTI:          0.156 m LVOT/AV VTI ratio: 0.26  AORTA Ao Root diam: 3.20 cm MITRAL VALVE  TRICUSPID VALVE MV Area (PHT): 3.03 cm             TR Peak grad:   32.5 mmHg MV Decel Time: 250 msec             TR Vmax:        285.00 cm/s MV E velocity: 93.60 cm/s 103 cm/s MV A velocity: 34.00 cm/s 70.3 cm/s SHUNTS MV E/A ratio:  2.75       1.5       Systemic VTI: 0.16 m Carlyle Dolly MD Electronically signed by Carlyle Dolly MD Signature Date/Time: 05/24/2019/2:18:21 PM    Final    ECHO TEE  Result Date: 05/28/2019    TRANSESOPHOGEAL ECHO REPORT   Patient Name:   Larry Hart Date of Exam: 05/28/2019 Medical Rec #:  [id number]       Height:       69.0 in Accession #:    [id number]      Weight:       209.0 lb Date of Birth:  1945-08-18       BSA:          2.10 m Patient Age:    77 years        BP:           135/74 mmHg Patient Gender: M               HR:           57 bpm. Exam Location:  [facility] Procedure: Transesophageal Echo and Color Doppler Indications:    bactermia  History:        Patient has prior history of Echocardiogram  examinations, most                 recent 05/24/2019. Signs/Symptoms:Bacteremia; Risk                 Factors:Diabetes, Non-Smoker, Hypertension and Dyslipidemia.                 Sepsis, TAVR.  Sonographer:    Leavy Cella RDCS (AE) Referring Phys: [id number] Royanne Foots [facility] PROCEDURE: The transesophogeal probe was passed without difficulty through the esophogus of the patient. Sedation performed by different physician. The patient developed no complications during the procedure. IMPRESSIONS  1. Left ventricular ejection fraction, by estimation, is 60 to 65%. The left ventricle has normal function. The left ventricle has no regional wall motion abnormalities. There is moderate concentric left ventricular hypertrophy. Left ventricular diastolic function could not be evaluated.  2. Right ventricular systolic function is mildly reduced. The right ventricular size is mildly enlarged.  3. Normal intracavitary left atrial appendage velocities. Left atrial size was severely dilated. No left atrial/left atrial appendage thrombus was detected.  4. Right atrial size was mildly dilated.  5. The mitral valve is grossly normal. Mild to moderate mitral valve regurgitation.  6. Edwards 26 mm Sapien 3 Ultra valve in the AV position. It is functioning normally. The aortic valve has been repaired/replaced. Aortic valve regurgitation is not visualized. No aortic stenosis is present. Conclusion(s)/Recomendation(s): No evidence of vegetation/infective endocarditis on this transesophageal echocardiogram. FINDINGS  Left Ventricle: Left ventricular ejection fraction, by estimation, is 60 to 65%. The left ventricle has normal function. The left ventricle has no regional wall motion abnormalities. There is moderate concentric left ventricular hypertrophy. Left ventricular diastolic function could not be evaluated. Right Ventricle: The right ventricular size is mildly enlarged. No increase in right ventricular wall thickness. Right ventricular  systolic function is mildly reduced. Left Atrium: Normal intracavitary left atrial appendage velocities. Left atrial size was severely dilated. No left atrial/left atrial appendage thrombus was detected. Right Atrium: Right atrial size was mildly dilated. Pericardium: There is no evidence of pericardial effusion. Mitral Valve: The mitral valve is grossly normal. Mild to moderate mitral valve regurgitation. Tricuspid Valve: The tricuspid valve is grossly normal. Tricuspid valve regurgitation is mild. Aortic Valve: Edwards 26 mm Sapien 3 Ultra valve in the AV position. It is functioning normally. The aortic valve has been repaired/replaced. Aortic valve regurgitation is not visualized. No aortic stenosis is present. Pulmonic Valve: The pulmonic valve was grossly normal. Pulmonic valve regurgitation is not visualized. Aorta: The aortic root is normal in size and structure. There is minimal (Grade I) plaque. IAS/Shunts: No atrial level shunt detected by color flow Doppler. Kate Sable MD Electronically signed by Kate Sable MD Signature Date/Time: 05/28/2019/4:32:36 PM    Final    [place] EKG SITE RITE  Result Date: 05/29/2019 If Site Rite image not attached, placement could not be confirmed due to current cardiac rhythm.    Discharge Exam: Vitals:   05/29/19 0626 05/29/19 0645  BP: (!) 177/61 (!) 155/66  Pulse: (!) 54 (!) 52  Resp: 18   Temp: 98.1 F (36.7 C)   SpO2: 97%    Vitals:   05/28/19 2042 05/28/19 2147 05/29/19 0626 05/29/19 0645  BP:  130/71 (!) 177/61 (!) 155/66  Pulse: (!) 58 (!) 57 (!) 54 (!) 52  Resp:  20 18   Temp:  98.4 F (36.9 C) 98.1 F (36.7 C)   TempSrc:  Oral    SpO2: 98% 96% 97%   Weight:      Height:        General: Pt is alert, awake, not in acute distress Cardiovascular: RRR, S1/S2 +, no rubs, no gallops Respiratory: CTA bilaterally, no wheezing, no rhonchi Abdominal: Soft, NT, ND, bowel sounds + Extremities: no edema, no cyanosis, right foot wound dry  with no further drainage and no findings of erythema.  Right foot transmetatarsal amputation.    The results of significant diagnostics from this hospitalization (including imaging, microbiology, ancillary and laboratory) are listed below for reference.     Microbiology: Recent Results (from the past 240 hour(s))  Urine culture     Status: None   Collection Time: 05/22/19  7:52 PM   Specimen: In/Out Cath Urine  Result Value Ref Range Status   Specimen Description   Final    IN/OUT CATH URINE Performed at [facility], [address] [place], [place] [address]    Special Requests   Final    NONE Performed at [facility], [address] [place], [place] [address]    Culture   Final    NO GROWTH Performed at [facility], [place] [address], [place], [place] [address]    Report Status 05/24/2019 FINAL  Final  Blood Culture (routine x 2)     Status: Abnormal   Collection Time: 05/22/19  8:42 PM   Specimen: BLOOD  Result Value Ref Range Status   Specimen Description   Final    BLOOD RIGHT ANTECUBITAL Performed at [facility], [address], [place], [place] [address]    Special Requests   Final    BOTTLES DRAWN AEROBIC AND ANAEROBIC Blood Culture adequate volume Performed at [facility], [address], [place], [place] [address]    Culture  Setup Time   Final  GRAM POSITIVE COCCI IN BOTH AEROBIC AND ANAEROBIC BOTTLES Gram Stain Report Called to,Read Back By and Verified With: MCMILLIAN B. AT 0920A ON [id number] BY THOMPSON S. CRITICAL RESULT CALLED TO, READ BACK BY AND VERIFIED WITH: PHARMD J COFFEE [id number] AT 1410 BY CM Performed at [facility], [place] [address], [place],  [address]    Culture STAPHYLOCOCCUS AUREUS ENTEROCOCCUS FAECALIS  (A)  Final   Report Status 05/26/2019 FINAL  Final   Organism ID, Bacteria STAPHYLOCOCCUS AUREUS  Final   Organism ID, Bacteria ENTEROCOCCUS FAECALIS  Final      Susceptibility   Enterococcus faecalis - MIC*     AMPICILLIN <=2 SENSITIVE Sensitive     VANCOMYCIN 1 SENSITIVE Sensitive     GENTAMICIN SYNERGY SENSITIVE Sensitive     * ENTEROCOCCUS FAECALIS   Staphylococcus aureus - MIC*    CIPROFLOXACIN <=0.5 SENSITIVE Sensitive     ERYTHROMYCIN <=0.25 SENSITIVE Sensitive     GENTAMICIN <=0.5 SENSITIVE Sensitive     OXACILLIN <=0.25 SENSITIVE Sensitive     TETRACYCLINE <=1 SENSITIVE Sensitive     VANCOMYCIN 1 SENSITIVE Sensitive     TRIMETH/SULFA <=10 SENSITIVE Sensitive     CLINDAMYCIN <=0.25 SENSITIVE Sensitive     RIFAMPIN <=0.5 SENSITIVE Sensitive     Inducible Clindamycin NEGATIVE Sensitive     * STAPHYLOCOCCUS AUREUS  Blood Culture ID Panel (Reflexed)     Status: Abnormal   Collection Time: 05/22/19  8:42 PM  Result Value Ref Range Status   Enterococcus species DETECTED (A) NOT DETECTED Final    Comment: CRITICAL RESULT CALLED TO, READ BACK BY AND VERIFIED WITH: PHARMD J COFFEE [id number] AT 1410 BY CM    Vancomycin resistance NOT DETECTED NOT DETECTED Final   Listeria monocytogenes NOT DETECTED NOT DETECTED Final   Staphylococcus species DETECTED (A) NOT DETECTED Final    Comment: CRITICAL RESULT CALLED TO, READ BACK BY AND VERIFIED WITH: PHARMD J COFFEE [id number] AT 1410 BY CM    Staphylococcus aureus (BCID) DETECTED (A) NOT DETECTED Final    Comment: Methicillin (oxacillin) susceptible Staphylococcus aureus (MSSA). Preferred therapy is anti staphylococcal beta lactam antibiotic (Cefazolin or Nafcillin), unless clinically contraindicated. CRITICAL RESULT CALLED TO, READ BACK BY AND VERIFIED WITH: PHARMD J COFFEE [id number] AT 24 BY CM    Methicillin resistance NOT DETECTED NOT DETECTED Final   Streptococcus species NOT DETECTED NOT DETECTED Final   Streptococcus agalactiae NOT DETECTED NOT DETECTED Final   Streptococcus pneumoniae NOT DETECTED NOT DETECTED Final   Streptococcus pyogenes NOT DETECTED NOT DETECTED Final   Acinetobacter baumannii NOT DETECTED NOT DETECTED Final    Enterobacteriaceae species NOT DETECTED NOT DETECTED Final   Enterobacter cloacae complex NOT DETECTED NOT DETECTED Final   Escherichia coli NOT DETECTED NOT DETECTED Final   Klebsiella oxytoca NOT DETECTED NOT DETECTED Final   Klebsiella pneumoniae NOT DETECTED NOT DETECTED Final   Proteus species NOT DETECTED NOT DETECTED Final   Serratia marcescens NOT DETECTED NOT DETECTED Final   Haemophilus influenzae NOT DETECTED NOT DETECTED Final   Neisseria meningitidis NOT DETECTED NOT DETECTED Final   Pseudomonas aeruginosa NOT DETECTED NOT DETECTED Final   Candida albicans NOT DETECTED NOT DETECTED Final   Candida glabrata NOT DETECTED NOT DETECTED Final   Candida krusei NOT DETECTED NOT DETECTED Final   Candida parapsilosis NOT DETECTED NOT DETECTED Final   Candida tropicalis NOT DETECTED NOT DETECTED Final    Comment: Performed at [facility], [address], [place], [place]  [address]  Blood Culture (routine x 2)     Status: Abnormal   Collection Time: 05/22/19  8:46 PM   Specimen: BLOOD RIGHT HAND  Result Value Ref Range Status   Specimen Description   Final    BLOOD RIGHT HAND Performed at [facility], [address] [place], [place] [address]    Special Requests   Final    BOTTLES DRAWN AEROBIC AND ANAEROBIC Blood Culture adequate volume Performed at [place] [place], [place] [address]    Culture  Setup Time   Final    GRAM POSITIVE COCCI IN BOTH AEROBIC AND ANAEROBIC BOTTLES Gram Stain Report Called to,Read Back By and Verified With: MCMILLIAN B. AT 0920A ON [id number] BY THOMPSON S. Performed at [facility], [address], [place], [place] [address]    Culture (A)  Final    STAPHYLOCOCCUS AUREUS ENTEROCOCCUS FAECALIS BOTH ORGANISMS HAVE HAD SUSCEPTIBILITIES PERFORMED ON PREVIOUS CULTURE WITHIN THE LAST 5 DAYS. Performed at [facility], [place] [address] [place], [place] [address]    Report Status 05/26/2019 FINAL  Final  Respiratory Panel by  RT PCR (Flu A&B, Covid) - Nasopharyngeal Swab     Status: None   Collection Time: 05/22/19  9:37 PM   Specimen: Nasopharyngeal Swab  Result Value Ref Range Status   SARS Coronavirus 2 by RT PCR NEGATIVE NEGATIVE Final    Comment: (NOTE) SARS-CoV-2 target nucleic acids are NOT DETECTED. The SARS-CoV-2 RNA is generally detectable in upper respiratoy specimens during the acute phase of infection. The lowest concentration of SARS-CoV-2 viral copies this assay can detect is 131 copies/mL. A negative result does not preclude SARS-Cov-2 infection and should not be used as the sole basis for treatment or other patient management decisions. A negative result may occur with  improper specimen collection/handling, submission of specimen other than nasopharyngeal swab, presence of viral mutation(s) within the areas targeted by this assay, and inadequate number of viral copies (<131 copies/mL). A negative result must be combined with clinical observations, patient history, and epidemiological information. The expected result is Negative. Fact Sheet for Patients:  PinkCheek.be Fact Sheet for Healthcare Providers:  GravelBags.it This test is not yet ap proved or cleared by the [place] FDA and  has been authorized for detection and/or diagnosis of SARS-CoV-2 by FDA under an Emergency Use Authorization (EUA). This EUA will remain  in effect (meaning this test can be used) for the duration of the COVID-19 declaration under Section 564(b)(1) of the Act, 21 U.S.C. section 360bbb-3(b)(1), unless the authorization is terminated or revoked sooner.    Influenza A by PCR NEGATIVE NEGATIVE Final   Influenza B by PCR NEGATIVE NEGATIVE Final    Comment: (NOTE) The Xpert Xpress SARS-CoV-2/FLU/RSV assay is intended as an aid in  the diagnosis of influenza from Nasopharyngeal swab specimens and  should not be used as a sole basis for treatment.  Nasal washings and  aspirates are unacceptable for Xpert Xpress SARS-CoV-2/FLU/RSV  testing. Fact Sheet for Patients: PinkCheek.be Fact Sheet for Healthcare Providers: GravelBags.it This test is not yet approved or cleared by the [place] FDA and  has been authorized for detection and/or diagnosis of SARS-CoV-2 by  FDA under an Emergency Use Authorization (EUA). This EUA will remain  in effect (meaning this test can be used) for the duration of the  Covid-19 declaration under Section 564(b)(1) of the Act, 21  U.S.C. section 360bbb-3(b)(1), unless the authorization is  terminated or revoked. Performed at  [facility], [address] [place], [place] [address]   MRSA PCR Screening     Status: None   Collection Time: 05/23/19  4:32 AM   Specimen: Nasal Mucosa; Nasopharyngeal  Result Value Ref Range Status   MRSA by PCR NEGATIVE NEGATIVE Final    Comment:        The GeneXpert MRSA Assay (FDA approved for NASAL specimens only), is one component of a comprehensive MRSA colonization surveillance program. It is not intended to diagnose MRSA infection nor to guide or monitor treatment for MRSA infections. Performed at [facility], [address] [place], [place] [address]   Culture, blood (routine x 2)     Status: Abnormal (Preliminary result)   Collection Time: 05/24/19  7:30 AM   Specimen: BLOOD LEFT HAND  Result Value Ref Range Status   Specimen Description   Final    BLOOD LEFT HAND Performed at [facility], [address], [place], [place] [address]    Special Requests   Final    BOTTLES DRAWN AEROBIC AND ANAEROBIC Blood Culture results may not be optimal due to an inadequate volume of blood received in culture bottles Performed at [facility], [address] [place], [place] [address]    Culture  Setup Time   Final    Performed at [place] Gram Stain Report Called  to,Read Back By and Verified With: MANSS,J AT 1930 ON 2.15.21 BY ISLEY,B Performed at [facility], [address] [place], [place] [address]    Culture (A)  Final    STAPHYLOCOCCUS HOMINIS THE SIGNIFICANCE OF ISOLATING THIS ORGANISM FROM A SINGLE SET OF BLOOD CULTURES WHEN MULTIPLE SETS ARE DRAWN IS UNCERTAIN. PLEASE NOTIFY THE MICROBIOLOGY DEPARTMENT WITHIN ONE WEEK IF SPECIATION AND SENSITIVITIES ARE REQUIRED. Performed at [facility], [place] [address] [place], [place] [address]    Report Status PENDING  Incomplete  Culture, blood (routine x 2)     Status: None   Collection Time: 05/24/19  7:30 AM   Specimen: BLOOD RIGHT HAND  Result Value Ref Range Status   Specimen Description BLOOD RIGHT HAND  Final   Special Requests   Final    BOTTLES DRAWN AEROBIC AND ANAEROBIC Blood Culture results may not be optimal due to an inadequate volume of blood received in culture bottles   Culture   Final    NO GROWTH 5 DAYS Performed at [facility], [address] [place], [place] [address]    Report Status 05/29/2019 FINAL  Final  Anaerobic culture     Status: None   Collection Time: 05/24/19  5:41 PM   Specimen: Heel  Result Value Ref Range Status   Specimen Description   Final    HEEL Performed at [facility], [address], [place], [place] [address]    Special Requests   Final    NONE Performed at [facility], [address], [place], [place] [address]    Culture   Final    NO ANAEROBES ISOLATED Performed at [facility], [place] [address], [place], [place] [address]    Report Status 05/29/2019 FINAL  Final  Aerobic Culture (superficial specimen)     Status: None   Collection Time: 05/24/19  5:41 PM   Specimen: Heel  Result Value Ref Range Status   Specimen Description HEEL  Final   Special Requests NONE  Final   Gram Stain   Final    ABUNDANT WBC  PRESENT, PREDOMINANTLY PMN ABUNDANT GRAM POSITIVE COCCI FEW GRAM POSITIVE RODS RARE GRAM NEGATIVE RODS Performed at  [facility], [place] [address], [place], [place] [address]    Culture   Final    FEW STAPHYLOCOCCUS AUREUS FEW SERRATIA MARCESCENS    Report Status 05/27/2019 FINAL  Final   Organism ID, Bacteria SERRATIA MARCESCENS  Final   Organism ID, Bacteria STAPHYLOCOCCUS AUREUS  Final      Susceptibility   Staphylococcus aureus - MIC*    CIPROFLOXACIN <=0.5 SENSITIVE Sensitive     ERYTHROMYCIN <=0.25 SENSITIVE Sensitive     GENTAMICIN <=0.5 SENSITIVE Sensitive     OXACILLIN <=0.25 SENSITIVE Sensitive     TETRACYCLINE <=1 SENSITIVE Sensitive     VANCOMYCIN 1 SENSITIVE Sensitive     TRIMETH/SULFA <=10 SENSITIVE Sensitive     CLINDAMYCIN <=0.25 SENSITIVE Sensitive     RIFAMPIN <=0.5 SENSITIVE Sensitive     Inducible Clindamycin NEGATIVE Sensitive     * FEW STAPHYLOCOCCUS AUREUS   Serratia marcescens - MIC*    CEFAZOLIN >=64 RESISTANT Resistant     CEFEPIME <=0.12 SENSITIVE Sensitive     CEFTAZIDIME <=1 SENSITIVE Sensitive     CEFTRIAXONE <=0.25 SENSITIVE Sensitive     CIPROFLOXACIN <=0.25 SENSITIVE Sensitive     GENTAMICIN <=1 SENSITIVE Sensitive     TRIMETH/SULFA <=20 SENSITIVE Sensitive     * FEW SERRATIA MARCESCENS  Surgical pcr screen     Status: Abnormal   Collection Time: 05/28/19  2:20 AM   Specimen: Nasal Mucosa; Nasal Swab  Result Value Ref Range Status   MRSA, PCR NEGATIVE NEGATIVE Final   Staphylococcus aureus POSITIVE (A) NEGATIVE Final    Comment: (NOTE) The Xpert SA Assay (FDA approved for NASAL specimens in patients 36 years of age and older), is one component of a comprehensive surveillance program. It is not intended to diagnose infection nor to guide or monitor treatment. Performed at [facility], [address] [place], [place] [address]      Labs: BNP (last 3 results) Recent Labs    05/24/19 1720  BNP 003.4*   Basic Metabolic Panel: Recent Labs  Lab 05/25/19 0412 05/26/19 0720 05/27/19 0658 05/28/19 0558 05/29/19 1121  NA 135 138 138 138 136   K 3.3* 3.0* 3.1* 3.5 3.6  CL 99 97* 97* 97* 97*  CO2 _0 GLUCOSE 263* 160* 182* 120* 240*  BUN 39* 33* 31* 27* 31*  CREATININE 1.35* 1.24 1.22 1.20 1.30*  CALCIUM 8.1* 8.3* 8.5* 8.8* 9.1  MG 2.1 2.0 1.8 1.8 2.0   Liver Function Tests: Recent Labs  Lab 05/22/19 1952 05/23/19 0742  AST 23 34  ALT 19 21  ALKPHOS 53 41  BILITOT 1.7* 0.9  PROT 7.7 6.5  ALBUMIN 3.7 3.2*   No results for input(s): LIPASE, AMYLASE in the last 168 hours. No results for input(s): AMMONIA in the last 168 hours. CBC: Recent Labs  Lab 05/24/19 0434 05/25/19 0412 05/26/19 0720 05/27/19 0658 05/29/19 1121  WBC 9.4 10.0 7.1 7.2 8.8  HGB 12.3* 12.3* 11.7* 12.1* 14.2  HCT 38.9* 37.0* 35.5* 36.2* 43.1  MCV 96.5 91.8 92.0 91.9 92.3  PLT 123* 151 179 237 328   Cardiac Enzymes: No results for input(s): CKTOTAL, CKMB, CKMBINDEX, TROPONINI in the last 168 hours. BNP: Invalid input(s): POCBNP CBG: Recent Labs  Lab 05/28/19 1258 05/28/19 1628 05/28/19 2124 05/29/19 0733 05/29/19 1105  GLUCAP 85 117* 216* 193* 235*   D-Dimer No  results for input(s): DDIMER in the last 72 hours. Hgb A1c No results for input(s): HGBA1C in the last 72 hours. Lipid Profile No results for input(s): CHOL, HDL, LDLCALC, TRIG, CHOLHDL, LDLDIRECT in the last 72 hours. Thyroid function studies No results for input(s): TSH, T4TOTAL, T3FREE, THYROIDAB in the last 72 hours.  Invalid input(s): FREET3 Anemia work up No results for input(s): VITAMINB12, FOLATE, FERRITIN, TIBC, IRON, RETICCTPCT in the last 72 hours. Urinalysis    Component Value Date/Time   COLORURINE YELLOW 05/22/2019 1952   APPEARANCEUR CLEAR 05/22/2019 1952   LABSPEC 1.026 05/22/2019 1952   PHURINE 5.0 05/22/2019 1952   GLUCOSEU >=500 (A) 05/22/2019 1952   HGBUR SMALL (A) 05/22/2019 [place] NEGATIVE 05/22/2019 1952   KETONESUR 20 (A) 05/22/2019 1952   PROTEINUR 30 (A) 05/22/2019 1952   NITRITE NEGATIVE 05/22/2019 1952    LEUKOCYTESUR NEGATIVE 05/22/2019 1952   Sepsis Labs Invalid input(s): PROCALCITONIN,  WBC,  LACTICIDVEN Microbiology Recent Results (from the past 240 hour(s))  Urine culture     Status: None   Collection Time: 05/22/19  7:52 PM   Specimen: In/Out Cath Urine  Result Value Ref Range Status   Specimen Description   Final    IN/OUT CATH URINE Performed at [facility], [address], [place], [place] [address]    Special Requests   Final    NONE Performed at [facility], [address], [place], [place] [address]    Culture   Final    NO GROWTH Performed at [facility], [place] [address] [place], [place] [address]    Report Status 05/24/2019 FINAL  Final  Blood Culture (routine x 2)     Status: Abnormal   Collection Time: 05/22/19  8:42 PM   Specimen: BLOOD  Result Value Ref Range Status   Specimen Description   Final    BLOOD RIGHT ANTECUBITAL Performed at [facility], [address] [place], [place] [address]    Special Requests   Final    BOTTLES DRAWN AEROBIC AND ANAEROBIC Blood Culture adequate volume Performed at [place] [place], [place] [address]    Culture  Setup Time   Final    GRAM POSITIVE COCCI IN BOTH AEROBIC AND ANAEROBIC BOTTLES Gram Stain Report Called to,Read Back By and Verified With: MCMILLIAN B. AT 0920A ON [id number] BY THOMPSON S. CRITICAL RESULT CALLED TO, READ BACK BY AND VERIFIED WITH: PHARMD J COFFEE [id number] AT 1410 BY CM Performed at [facility], [place] [address] [place], [place] [address]    Culture STAPHYLOCOCCUS AUREUS ENTEROCOCCUS FAECALIS  (A)  Final   Report Status 05/26/2019 FINAL  Final   Organism ID, Bacteria STAPHYLOCOCCUS AUREUS  Final   Organism ID, Bacteria ENTEROCOCCUS FAECALIS  Final      Susceptibility   Enterococcus faecalis - MIC*    AMPICILLIN <=2 SENSITIVE Sensitive     VANCOMYCIN 1 SENSITIVE Sensitive     GENTAMICIN SYNERGY SENSITIVE Sensitive     * ENTEROCOCCUS FAECALIS   Staphylococcus  aureus - MIC*    CIPROFLOXACIN <=0.5 SENSITIVE Sensitive     ERYTHROMYCIN <=0.25 SENSITIVE Sensitive     GENTAMICIN <=0.5 SENSITIVE Sensitive     OXACILLIN <=0.25 SENSITIVE Sensitive     TETRACYCLINE <=1 SENSITIVE Sensitive     VANCOMYCIN 1 SENSITIVE Sensitive     TRIMETH/SULFA <=10 SENSITIVE Sensitive     CLINDAMYCIN <=0.25 SENSITIVE Sensitive     RIFAMPIN <=0.5 SENSITIVE Sensitive  Inducible Clindamycin NEGATIVE Sensitive     * STAPHYLOCOCCUS AUREUS  Blood Culture ID Panel (Reflexed)     Status: Abnormal   Collection Time: 05/22/19  8:42 PM  Result Value Ref Range Status   Enterococcus species DETECTED (A) NOT DETECTED Final    Comment: CRITICAL RESULT CALLED TO, READ BACK BY AND VERIFIED WITH: PHARMD J COFFEE [id number] AT 1410 BY CM    Vancomycin resistance NOT DETECTED NOT DETECTED Final   Listeria monocytogenes NOT DETECTED NOT DETECTED Final   Staphylococcus species DETECTED (A) NOT DETECTED Final    Comment: CRITICAL RESULT CALLED TO, READ BACK BY AND VERIFIED WITH: PHARMD J COFFEE [id number] AT 1410 BY CM    Staphylococcus aureus (BCID) DETECTED (A) NOT DETECTED Final    Comment: Methicillin (oxacillin) susceptible Staphylococcus aureus (MSSA). Preferred therapy is anti staphylococcal beta lactam antibiotic (Cefazolin or Nafcillin), unless clinically contraindicated. CRITICAL RESULT CALLED TO, READ BACK BY AND VERIFIED WITH: PHARMD J COFFEE [id number] AT 1 BY CM    Methicillin resistance NOT DETECTED NOT DETECTED Final   Streptococcus species NOT DETECTED NOT DETECTED Final   Streptococcus agalactiae NOT DETECTED NOT DETECTED Final   Streptococcus pneumoniae NOT DETECTED NOT DETECTED Final   Streptococcus pyogenes NOT DETECTED NOT DETECTED Final   Acinetobacter baumannii NOT DETECTED NOT DETECTED Final   Enterobacteriaceae species NOT DETECTED NOT DETECTED Final   Enterobacter cloacae complex NOT DETECTED NOT DETECTED Final   Escherichia coli NOT DETECTED NOT DETECTED Final    Klebsiella oxytoca NOT DETECTED NOT DETECTED Final   Klebsiella pneumoniae NOT DETECTED NOT DETECTED Final   Proteus species NOT DETECTED NOT DETECTED Final   Serratia marcescens NOT DETECTED NOT DETECTED Final   Haemophilus influenzae NOT DETECTED NOT DETECTED Final   Neisseria meningitidis NOT DETECTED NOT DETECTED Final   Pseudomonas aeruginosa NOT DETECTED NOT DETECTED Final   Candida albicans NOT DETECTED NOT DETECTED Final   Candida glabrata NOT DETECTED NOT DETECTED Final   Candida krusei NOT DETECTED NOT DETECTED Final   Candida parapsilosis NOT DETECTED NOT DETECTED Final   Candida tropicalis NOT DETECTED NOT DETECTED Final    Comment: Performed at [facility], [address], [place], [place] [address]  Blood Culture (routine x 2)     Status: Abnormal   Collection Time: 05/22/19  8:46 PM   Specimen: BLOOD RIGHT HAND  Result Value Ref Range Status   Specimen Description   Final    BLOOD RIGHT HAND Performed at [facility], [address], [place], [place] [address]    Special Requests   Final    BOTTLES DRAWN AEROBIC AND ANAEROBIC Blood Culture adequate volume Performed at [place] [place], [place] [address]    Culture  Setup Time   Final    GRAM POSITIVE COCCI IN BOTH AEROBIC AND ANAEROBIC BOTTLES Gram Stain Report Called to,Read Back By and Verified With: MCMILLIAN B. AT 0920A ON [id number] BY THOMPSON S. Performed at [facility], [address] [place], [place] [address]    Culture (A)  Final    STAPHYLOCOCCUS AUREUS ENTEROCOCCUS FAECALIS BOTH ORGANISMS HAVE HAD SUSCEPTIBILITIES PERFORMED ON PREVIOUS CULTURE WITHIN THE LAST 5 DAYS. Performed at [facility], [place] [address], [place], [place] [address]    Report Status 05/26/2019 FINAL  Final  Respiratory Panel by RT PCR (Flu A&B, Covid) - Nasopharyngeal Swab     Status: None   Collection Time: 05/22/19  9:37 PM   Specimen: Nasopharyngeal Swab  Result Value Ref Range Status   SARS  Coronavirus 2 by RT PCR NEGATIVE NEGATIVE Final    Comment: (NOTE) SARS-CoV-2 target nucleic acids are NOT DETECTED. The SARS-CoV-2 RNA is generally detectable in upper respiratoy specimens during the acute phase of infection. The lowest concentration of SARS-CoV-2 viral copies this assay can detect is 131 copies/mL. A negative result does not preclude SARS-Cov-2 infection and should not be used as the sole basis for treatment or other patient management decisions. A negative result may occur with  improper specimen collection/handling, submission of specimen other than nasopharyngeal swab, presence of viral mutation(s) within the areas targeted by this assay, and inadequate number of viral copies (<131 copies/mL). A negative result must be combined with clinical observations, patient history, and epidemiological information. The expected result is Negative. Fact Sheet for Patients:  PinkCheek.be Fact Sheet for Healthcare Providers:  GravelBags.it This test is not yet ap proved or cleared by the [place] FDA and  has been authorized for detection and/or diagnosis of SARS-CoV-2 by FDA under an Emergency Use Authorization (EUA). This EUA will remain  in effect (meaning this test can be used) for the duration of the COVID-19 declaration under Section 564(b)(1) of the Act, 21 U.S.C. section 360bbb-3(b)(1), unless the authorization is terminated or revoked sooner.    Influenza A by PCR NEGATIVE NEGATIVE Final   Influenza B by PCR NEGATIVE NEGATIVE Final    Comment: (NOTE) The Xpert Xpress SARS-CoV-2/FLU/RSV assay is intended as an aid in  the diagnosis of influenza from Nasopharyngeal swab specimens and  should not be used as a sole basis for treatment. Nasal washings and  aspirates are unacceptable for Xpert Xpress SARS-CoV-2/FLU/RSV  testing. Fact Sheet for Patients: PinkCheek.be Fact Sheet  for Healthcare Providers: GravelBags.it This test is not yet approved or cleared by the [place] FDA and  has been authorized for detection and/or diagnosis of SARS-CoV-2 by  FDA under an Emergency Use Authorization (EUA). This EUA will remain  in effect (meaning this test can be used) for the duration of the  Covid-19 declaration under Section 564(b)(1) of the Act, 21  U.S.C. section 360bbb-3(b)(1), unless the authorization is  terminated or revoked. Performed at [facility], [address] [place], [place] [address]   MRSA PCR Screening     Status: None   Collection Time: 05/23/19  4:32 AM   Specimen: Nasal Mucosa; Nasopharyngeal  Result Value Ref Range Status   MRSA by PCR NEGATIVE NEGATIVE Final    Comment:        The GeneXpert MRSA Assay (FDA approved for NASAL specimens only), is one component of a comprehensive MRSA colonization surveillance program. It is not intended to diagnose MRSA infection nor to guide or monitor treatment for MRSA infections. Performed at [facility], [address] [place], [place] [address]   Culture, blood (routine x 2)     Status: Abnormal (Preliminary result)   Collection Time: 05/24/19  7:30 AM   Specimen: BLOOD LEFT HAND  Result Value Ref Range Status   Specimen Description   Final    BLOOD LEFT HAND Performed at [facility], [address], [place], [place] [address]    Special Requests   Final    BOTTLES DRAWN AEROBIC AND ANAEROBIC Blood Culture results may not be optimal due to an inadequate volume of blood received in culture bottles Performed at [facility], [address] [place],  [address]    Culture  Setup Time   Final  Performed at [place] [facility] Gram Stain Report Called to,Read Back By and Verified With: MANSS,J AT [place] ON 2.15.21 BY ISLEY,B Performed at [facility], [address] [place], [place] [address]    Culture (A)  Final     STAPHYLOCOCCUS HOMINIS THE SIGNIFICANCE OF ISOLATING THIS ORGANISM FROM A SINGLE SET OF BLOOD CULTURES WHEN MULTIPLE SETS ARE DRAWN IS UNCERTAIN. PLEASE NOTIFY THE MICROBIOLOGY DEPARTMENT WITHIN ONE WEEK IF SPECIATION AND SENSITIVITIES ARE REQUIRED. Performed at [facility], [place] [address], [place], [place] [address]    Report Status PENDING  Incomplete  Culture, blood (routine x 2)     Status: None   Collection Time: 05/24/19  7:30 AM   Specimen: BLOOD RIGHT HAND  Result Value Ref Range Status   Specimen Description BLOOD RIGHT HAND  Final   Special Requests   Final    BOTTLES DRAWN AEROBIC AND ANAEROBIC Blood Culture results may not be optimal due to an inadequate volume of blood received in culture bottles   Culture   Final    NO GROWTH 5 DAYS Performed at [facility], [address], [place], [place] [address]    Report Status 05/29/2019 FINAL  Final  Anaerobic culture     Status: None   Collection Time: 05/24/19  5:41 PM   Specimen: Heel  Result Value Ref Range Status   Specimen Description   Final    HEEL Performed at [facility], [address], [place], [place] [address]    Special Requests   Final    NONE Performed at [facility], [address], [place], [place] [address]    Culture   Final    NO ANAEROBES ISOLATED Performed at [facility], [place] [address], [place], [place] [address]    Report Status 05/29/2019 FINAL  Final  Aerobic Culture (superficial specimen)     Status: None   Collection Time: 05/24/19  5:41 PM   Specimen: Heel  Result Value Ref Range Status   Specimen Description HEEL  Final   Special Requests NONE  Final   Gram Stain   Final    ABUNDANT WBC PRESENT, PREDOMINANTLY PMN ABUNDANT GRAM POSITIVE COCCI FEW GRAM POSITIVE RODS RARE GRAM NEGATIVE RODS Performed at [facility] Lab, [address] [place], [place] [address]    Culture   Final    FEW STAPHYLOCOCCUS AUREUS FEW SERRATIA MARCESCENS    Report Status 05/27/2019  FINAL  Final   Organism ID, Bacteria SERRATIA MARCESCENS  Final   Organism ID, Bacteria STAPHYLOCOCCUS AUREUS  Final      Susceptibility   Staphylococcus aureus - MIC*    CIPROFLOXACIN <=0.5 SENSITIVE Sensitive     ERYTHROMYCIN <=0.25 SENSITIVE Sensitive     GENTAMICIN <=0.5 SENSITIVE Sensitive     OXACILLIN <=0.25 SENSITIVE Sensitive     TETRACYCLINE <=1 SENSITIVE Sensitive     VANCOMYCIN 1 SENSITIVE Sensitive     TRIMETH/SULFA <=10 SENSITIVE Sensitive     CLINDAMYCIN <=0.25 SENSITIVE Sensitive     RIFAMPIN <=0.5 SENSITIVE Sensitive     Inducible Clindamycin NEGATIVE Sensitive     * FEW STAPHYLOCOCCUS AUREUS   Serratia marcescens - MIC*    CEFAZOLIN >=64 RESISTANT Resistant     CEFEPIME <=0.12 SENSITIVE Sensitive     CEFTAZIDIME <=1 SENSITIVE Sensitive     CEFTRIAXONE <=0.25 SENSITIVE Sensitive     CIPROFLOXACIN <=0.25 SENSITIVE Sensitive     GENTAMICIN <=1 SENSITIVE Sensitive  TRIMETH/SULFA <=20 SENSITIVE Sensitive     * FEW SERRATIA MARCESCENS  Surgical pcr screen     Status: Abnormal   Collection Time: 05/28/19  2:20 AM   Specimen: Nasal Mucosa; Nasal Swab  Result Value Ref Range Status   MRSA, PCR NEGATIVE NEGATIVE Final   Staphylococcus aureus POSITIVE (A) NEGATIVE Final    Comment: (NOTE) The Xpert SA Assay (FDA approved for NASAL specimens in patients 19 years of age and older), is one component of a comprehensive surveillance program. It is not intended to diagnose infection nor to guide or monitor treatment. Performed at [facility], [address] [place], [place] [address]      Time coordinating discharge: 35 minutes  SIGNED:   Rodena Goldmann, DO [facility] 05/29/2019, 2:11 PM  If 7PM-7AM, please contact night-coverage [email or web address]

## 2019-05-29 NOTE — Progress Notes (Signed)
PHARMACY CONSULT NOTE FOR:  OUTPATIENT  PARENTERAL ANTIBIOTIC THERAPY (OPAT)  Indication: Bacteremia and diabetic foot infection Regimen: Zosyn 13.5gm IV continuous infusion every 24 hours End date: 06/18/2019  IV antibiotic discharge orders are pended. To discharging provider:  please sign these orders via discharge navigator,  Select New Orders & click on the button choice - Manage This Unsigned Work.     Thank you for allowing pharmacy to be a part of this patient's care.  Isac Sarna, BS Vena Austria, BCPS Clinical Pharmacist Pager [phone] Cristy Friedlander 05/29/2019, 12:41 PM

## 2019-05-29 NOTE — Progress Notes (Signed)
Patient ID: Larry Hart, male   DOB: Jul 28, 1945, 74 y.o.   MRN: [id number]          [facility]    Date of Admission:  05/22/2019    Total days of antibiotics 8        Day 7 ampicillin sulbactam        Day 3 ceftriaxone  Larry Hart has become afebrile and seems to be improving on therapy for MSSA and enterococcal bacteremia complicating a right diabetic foot infection. Wound drainage cultures grew MSSA and Serratia.  Repeat blood cultures from 05/24/2019 are now reported to be growing Staph hominis, an insignificant contaminant in 1 set.  I feel comfortable having a PICC placed now.  There was no evidence of endocarditis seen on TEE.  I recommend completing a total of 4 weeks of IV antibiotic therapy with a continuous infusion of piperacillin tazobactam.  This will cover MSSA, Enterococcus, Serratia and anaerobes which may be present on his foot.  I will sign off now and arrange follow-up in my clinic.    Diagnosis: Bacteremia  Culture Result: MSSA and Enterococcus  Allergies  Allergen Reactions  . Honey Bee Treatment [Bee Venom]   . Other Palpitations and Rash    Histalet, tears up nerves,possible rash    OPAT Orders Discharge antibiotics: Per pharmacy protocol piperacillin tazobactam  Duration: 4 weeks End Date: 06/18/2019  [facility] Care Per Protocol:  Home health RN for IV administration and teaching; PICC line care and labs.    Labs weekly while on IV antibiotics: _x_ CBC with differential _x_ BMP __ CMP _x_ CRP _x_ ESR __ Vancomycin trough __ CK  _x_ Please pull PIC at completion of IV antibiotics __ Please leave PIC in place until doctor has seen patient or been notified  Fax weekly labs to [phone]  Clinic Follow Up Appt: 06/18/2019         Larry Hart, [place] [facility] [phone] pager   [phone] cell 05/29/2019, 8:33 AM

## 2019-05-30 DIAGNOSIS — L02611 Cutaneous abscess of right foot: Secondary | ICD-10-CM

## 2019-05-30 DIAGNOSIS — R7881 Bacteremia: Secondary | ICD-10-CM

## 2019-05-30 DIAGNOSIS — B9561 Methicillin susceptible Staphylococcus aureus infection as the cause of diseases classified elsewhere: Secondary | ICD-10-CM

## 2019-05-30 LAB — CREATININE, SERUM
Creatinine, Ser: 1.36 mg/dL — ABNORMAL HIGH (ref 0.61–1.24)
GFR calc Af Amer: 60 mL/min — ABNORMAL LOW (ref >60)
GFR calc non Af Amer: 52 mL/min — ABNORMAL LOW (ref >60)

## 2019-05-30 LAB — CULTURE, BLOOD (ROUTINE X 2)

## 2019-05-30 LAB — GLUCOSE, CAPILLARY
Glucose-Capillary: 155 mg/dL — ABNORMAL HIGH (ref 70–99)
Glucose-Capillary: 166 mg/dL — ABNORMAL HIGH (ref 70–99)
Glucose-Capillary: 133 mg/dL — ABNORMAL HIGH (ref 70–99)

## 2019-05-30 MED ORDER — SODIUM CHLORIDE 0.9% FLUSH
10.0000 mL | Freq: Two times a day (BID) | INTRAVENOUS | Status: DC
  Administered 2019-05-30: 20 mL

## 2019-05-30 MED ORDER — SODIUM CHLORIDE 0.9% FLUSH: 10.0000 mL | INTRAVENOUS | Status: DC | PRN

## 2019-05-30 NOTE — Progress Notes (Signed)
PT discharged via [facility] by staff to POV with all belongings with him,

## 2019-05-30 NOTE — Progress Notes (Signed)
Pt has slept most of afternoon awaiting discharge. Wife at bedside Zosyn infusion continues via PICC line. Pt denies c/o. Discharge instructions reviewed with wife by [facility] nurse.  IV left AC removed, condom cath removed. Pt assisted by wife and NT to get dressed.

## 2019-05-30 NOTE — TOC Transition Note (Signed)
Transition of Care [facility]) - CM/SW Discharge Note   Patient Details  Name: Gwen Edler MRN: [id number] Date of Birth: 1945-12-10  Transition of Care [facility]) CM/SW Contact:  Legion Discher, Chauncey Reading, RN Phone Number: 05/30/2019, 12:10 PM   Clinical Narrative:   Patient discharging home today. Pam of [facility] aware and has notified [place] health. Start of care tomorrow.     Final next level of care: [place] Barriers to Discharge: Barriers Resolved   Patient Goals and CMS Choice Patient states their goals for this hospitalization and ongoing recovery are:: return home                HH Arranged: RN, IV Antibiotics HH Agency: [place]) Date [place]: 05/28/19 Time [place]: 1615 Representative spoke with at [place]: Carolynn Sayers, with [facility], contacted [place] to arrange RN for IV abx.  Social Determinants of Health (SDOH) Interventions     Readmission Risk Interventions No flowsheet data found.

## 2019-05-30 NOTE — Care Management Important Message (Signed)
Important Message  Patient Details  Name: Larry Hart MRN: [id number] Date of Birth: April 03, 1946   Medicare Important Message Given:  Yes     [name] 05/30/2019, 2:21 PM

## 2019-05-30 NOTE — Discharge Summary (Signed)
Patient seen and examined. VSS; will discharge home with instructions for 20 more days of Zosyn. Follow up with ID as instructed. For further details please refer to discharge summary written by Dr. Manuella Ghazi on 05/29/2019.  Barton Dubois MD [phone]

## 2019-05-30 NOTE — Progress Notes (Signed)
PICC line insertion nurse in pt's room for procedure at 0725. Pt was extremely anxious this am regarding PICC line insertion. Per pt request,  Xanax 0.25mg  po given at 0751.  At 0930, PICC line nurse informed me that procedure was complete, pt tolerated well, line was ready for use and pt was sleeping soundly. 0945, in to evaluate pt, pt sleeping soundly, snoring. Awakened to name called and gentle shake of arm. Pt denies c/o. States, "I'm trying to get some rest here!". Pt has not eaten breakfast this am, tray set up and pt stated, "I ain't hungry." 0800 am s/s insulin not yet given due to procedure, repeat accucheck shows 166mg /dl. Pt agreed to take novolog insulin, but when offered 1000 am meds, pt states, "I'm trying to rest. Leave me alone."  PICC line intact, no bleeding or pain noted with palpation or movement of arm. Will attempt med pass later this am.

## 2019-06-01 DIAGNOSIS — Z452 Encounter for adjustment and management of vascular access device: Secondary | ICD-10-CM | POA: Diagnosis not present

## 2019-06-01 DIAGNOSIS — N183 Chronic kidney disease, stage 3 unspecified: Secondary | ICD-10-CM | POA: Diagnosis not present

## 2019-06-01 DIAGNOSIS — D696 Thrombocytopenia, unspecified: Secondary | ICD-10-CM | POA: Diagnosis not present

## 2019-06-01 DIAGNOSIS — E11621 Type 2 diabetes mellitus with foot ulcer: Secondary | ICD-10-CM | POA: Diagnosis not present

## 2019-06-01 DIAGNOSIS — Z794 Long term (current) use of insulin: Secondary | ICD-10-CM | POA: Diagnosis not present

## 2019-06-01 DIAGNOSIS — I129 Hypertensive chronic kidney disease with stage 1 through stage 4 chronic kidney disease, or unspecified chronic kidney disease: Secondary | ICD-10-CM | POA: Diagnosis not present

## 2019-06-01 DIAGNOSIS — L97511 Non-pressure chronic ulcer of other part of right foot limited to breakdown of skin: Secondary | ICD-10-CM | POA: Diagnosis not present

## 2019-06-01 DIAGNOSIS — E1122 Type 2 diabetes mellitus with diabetic chronic kidney disease: Secondary | ICD-10-CM | POA: Diagnosis not present

## 2019-06-01 DIAGNOSIS — E1151 Type 2 diabetes mellitus with diabetic peripheral angiopathy without gangrene: Secondary | ICD-10-CM | POA: Diagnosis not present

## 2019-06-01 DIAGNOSIS — D631 Anemia in chronic kidney disease: Secondary | ICD-10-CM | POA: Diagnosis not present

## 2019-06-01 DIAGNOSIS — G2581 Restless legs syndrome: Secondary | ICD-10-CM | POA: Diagnosis not present

## 2019-06-01 DIAGNOSIS — E039 Hypothyroidism, unspecified: Secondary | ICD-10-CM | POA: Diagnosis not present

## 2019-06-01 DIAGNOSIS — Z7982 Long term (current) use of aspirin: Secondary | ICD-10-CM | POA: Diagnosis not present

## 2019-06-01 DIAGNOSIS — Z7902 Long term (current) use of antithrombotics/antiplatelets: Secondary | ICD-10-CM | POA: Diagnosis not present

## 2019-06-01 DIAGNOSIS — I2510 Atherosclerotic heart disease of native coronary artery without angina pectoris: Secondary | ICD-10-CM | POA: Diagnosis not present

## 2019-06-01 DIAGNOSIS — A4101 Sepsis due to Methicillin susceptible Staphylococcus aureus: Secondary | ICD-10-CM | POA: Diagnosis not present

## 2019-06-01 DIAGNOSIS — Z792 Long term (current) use of antibiotics: Secondary | ICD-10-CM | POA: Diagnosis not present

## 2019-06-04 DIAGNOSIS — E1151 Type 2 diabetes mellitus with diabetic peripheral angiopathy without gangrene: Secondary | ICD-10-CM | POA: Diagnosis not present

## 2019-06-04 DIAGNOSIS — I2510 Atherosclerotic heart disease of native coronary artery without angina pectoris: Secondary | ICD-10-CM | POA: Diagnosis not present

## 2019-06-04 DIAGNOSIS — D696 Thrombocytopenia, unspecified: Secondary | ICD-10-CM | POA: Diagnosis not present

## 2019-06-04 DIAGNOSIS — A4101 Sepsis due to Methicillin susceptible Staphylococcus aureus: Secondary | ICD-10-CM | POA: Diagnosis not present

## 2019-06-04 DIAGNOSIS — E1122 Type 2 diabetes mellitus with diabetic chronic kidney disease: Secondary | ICD-10-CM | POA: Diagnosis not present

## 2019-06-04 DIAGNOSIS — L97511 Non-pressure chronic ulcer of other part of right foot limited to breakdown of skin: Secondary | ICD-10-CM | POA: Diagnosis not present

## 2019-06-04 DIAGNOSIS — E11621 Type 2 diabetes mellitus with foot ulcer: Secondary | ICD-10-CM | POA: Diagnosis not present

## 2019-06-04 DIAGNOSIS — I129 Hypertensive chronic kidney disease with stage 1 through stage 4 chronic kidney disease, or unspecified chronic kidney disease: Secondary | ICD-10-CM | POA: Diagnosis not present

## 2019-06-05 DIAGNOSIS — E11621 Type 2 diabetes mellitus with foot ulcer: Secondary | ICD-10-CM | POA: Diagnosis not present

## 2019-06-05 DIAGNOSIS — I2510 Atherosclerotic heart disease of native coronary artery without angina pectoris: Secondary | ICD-10-CM | POA: Diagnosis not present

## 2019-06-05 DIAGNOSIS — A4101 Sepsis due to Methicillin susceptible Staphylococcus aureus: Secondary | ICD-10-CM | POA: Diagnosis not present

## 2019-06-05 DIAGNOSIS — L97511 Non-pressure chronic ulcer of other part of right foot limited to breakdown of skin: Secondary | ICD-10-CM | POA: Diagnosis not present

## 2019-06-05 DIAGNOSIS — E1151 Type 2 diabetes mellitus with diabetic peripheral angiopathy without gangrene: Secondary | ICD-10-CM | POA: Diagnosis not present

## 2019-06-05 DIAGNOSIS — I129 Hypertensive chronic kidney disease with stage 1 through stage 4 chronic kidney disease, or unspecified chronic kidney disease: Secondary | ICD-10-CM | POA: Diagnosis not present

## 2019-06-07 DIAGNOSIS — N183 Chronic kidney disease, stage 3 unspecified: Secondary | ICD-10-CM | POA: Diagnosis not present

## 2019-06-07 DIAGNOSIS — E11621 Type 2 diabetes mellitus with foot ulcer: Secondary | ICD-10-CM | POA: Diagnosis not present

## 2019-06-07 DIAGNOSIS — Z89431 Acquired absence of right foot: Secondary | ICD-10-CM | POA: Diagnosis not present

## 2019-06-07 DIAGNOSIS — I739 Peripheral vascular disease, unspecified: Secondary | ICD-10-CM | POA: Diagnosis not present

## 2019-06-07 DIAGNOSIS — L97411 Non-pressure chronic ulcer of right heel and midfoot limited to breakdown of skin: Secondary | ICD-10-CM | POA: Diagnosis not present

## 2019-06-07 DIAGNOSIS — E08621 Diabetes mellitus due to underlying condition with foot ulcer: Secondary | ICD-10-CM | POA: Diagnosis not present

## 2019-06-07 DIAGNOSIS — L97519 Non-pressure chronic ulcer of other part of right foot with unspecified severity: Secondary | ICD-10-CM | POA: Diagnosis not present

## 2019-06-07 DIAGNOSIS — I10 Essential (primary) hypertension: Secondary | ICD-10-CM | POA: Diagnosis not present

## 2019-06-08 ENCOUNTER — Other Ambulatory Visit: Payer: Self-pay | Admitting: *Deleted

## 2019-06-08 DIAGNOSIS — E7849 Other hyperlipidemia: Secondary | ICD-10-CM | POA: Diagnosis not present

## 2019-06-08 DIAGNOSIS — I10 Essential (primary) hypertension: Secondary | ICD-10-CM | POA: Diagnosis not present

## 2019-06-08 NOTE — Patient Outreach (Signed)
[facility]) Care Management  06/08/2019  Larry Hart 1945/10/06 [id number]   EMMI-GENERAL DISCHARGE RED ON EMMI ALERT Day #4 Date: 06/04/2019 Red Alert Reason: Has not read D/C paperwork, Don't know who to call with issues  Outreach #1 RN spoke with pt's spouse [name]) as pt was sleeping. Caregiver indicates pt may has answer incorrectly but confirms pt has his discharge paperwork and aware of who to call with any issues related to her recent discharge. No other needs to address at this time.  Plan: Will close this case.  Raina Mina, RN Care Management Coordinator [place] Office [phone]

## 2019-06-11 DIAGNOSIS — E11621 Type 2 diabetes mellitus with foot ulcer: Secondary | ICD-10-CM | POA: Diagnosis not present

## 2019-06-11 DIAGNOSIS — A4101 Sepsis due to Methicillin susceptible Staphylococcus aureus: Secondary | ICD-10-CM | POA: Diagnosis not present

## 2019-06-11 DIAGNOSIS — Z792 Long term (current) use of antibiotics: Secondary | ICD-10-CM | POA: Diagnosis not present

## 2019-06-11 DIAGNOSIS — L97511 Non-pressure chronic ulcer of other part of right foot limited to breakdown of skin: Secondary | ICD-10-CM | POA: Diagnosis not present

## 2019-06-11 DIAGNOSIS — I129 Hypertensive chronic kidney disease with stage 1 through stage 4 chronic kidney disease, or unspecified chronic kidney disease: Secondary | ICD-10-CM | POA: Diagnosis not present

## 2019-06-11 DIAGNOSIS — I2510 Atherosclerotic heart disease of native coronary artery without angina pectoris: Secondary | ICD-10-CM | POA: Diagnosis not present

## 2019-06-11 DIAGNOSIS — E1122 Type 2 diabetes mellitus with diabetic chronic kidney disease: Secondary | ICD-10-CM | POA: Diagnosis not present

## 2019-06-11 DIAGNOSIS — N189 Chronic kidney disease, unspecified: Secondary | ICD-10-CM | POA: Diagnosis not present

## 2019-06-11 DIAGNOSIS — E1151 Type 2 diabetes mellitus with diabetic peripheral angiopathy without gangrene: Secondary | ICD-10-CM | POA: Diagnosis not present

## 2019-06-12 ENCOUNTER — Emergency Department (HOSPITAL_COMMUNITY): Payer: Medicare Other

## 2019-06-12 ENCOUNTER — Inpatient Hospital Stay (HOSPITAL_COMMUNITY)
Admission: EM | Admit: 2019-06-12 | Discharge: 2019-06-15 | DRG: 392 | Disposition: A | Discharge status: Rehabilitation Facility | Payer: Medicare Other | Attending: Internal Medicine | Admitting: Internal Medicine

## 2019-06-12 ENCOUNTER — Other Ambulatory Visit: Payer: Self-pay

## 2019-06-12 ENCOUNTER — Encounter (HOSPITAL_COMMUNITY): Payer: Self-pay | Admitting: Emergency Medicine

## 2019-06-12 DIAGNOSIS — E612 Magnesium deficiency: Secondary | ICD-10-CM | POA: Diagnosis not present

## 2019-06-12 DIAGNOSIS — K3184 Gastroparesis: Secondary | ICD-10-CM | POA: Diagnosis present

## 2019-06-12 DIAGNOSIS — Z7982 Long term (current) use of aspirin: Secondary | ICD-10-CM

## 2019-06-12 DIAGNOSIS — Z8249 Family history of ischemic heart disease and other diseases of the circulatory system: Secondary | ICD-10-CM

## 2019-06-12 DIAGNOSIS — Z7401 Bed confinement status: Secondary | ICD-10-CM | POA: Diagnosis not present

## 2019-06-12 DIAGNOSIS — E1165 Type 2 diabetes mellitus with hyperglycemia: Secondary | ICD-10-CM | POA: Diagnosis not present

## 2019-06-12 DIAGNOSIS — Z66 Do not resuscitate: Secondary | ICD-10-CM | POA: Diagnosis present

## 2019-06-12 DIAGNOSIS — R5383 Other fatigue: Secondary | ICD-10-CM | POA: Diagnosis not present

## 2019-06-12 DIAGNOSIS — R790 Abnormal level of blood mineral: Secondary | ICD-10-CM

## 2019-06-12 DIAGNOSIS — I10 Essential (primary) hypertension: Secondary | ICD-10-CM | POA: Diagnosis present

## 2019-06-12 DIAGNOSIS — R197 Diarrhea, unspecified: Secondary | ICD-10-CM | POA: Diagnosis not present

## 2019-06-12 DIAGNOSIS — I739 Peripheral vascular disease, unspecified: Secondary | ICD-10-CM | POA: Diagnosis not present

## 2019-06-12 DIAGNOSIS — Z89439 Acquired absence of unspecified foot: Secondary | ICD-10-CM | POA: Diagnosis not present

## 2019-06-12 DIAGNOSIS — R7881 Bacteremia: Secondary | ICD-10-CM | POA: Diagnosis present

## 2019-06-12 DIAGNOSIS — Z79890 Hormone replacement therapy (postmenopausal): Secondary | ICD-10-CM | POA: Diagnosis not present

## 2019-06-12 DIAGNOSIS — A4101 Sepsis due to Methicillin susceptible Staphylococcus aureus: Secondary | ICD-10-CM | POA: Diagnosis not present

## 2019-06-12 DIAGNOSIS — E0840 Diabetes mellitus due to underlying condition with diabetic neuropathy, unspecified: Secondary | ICD-10-CM | POA: Diagnosis not present

## 2019-06-12 DIAGNOSIS — E1122 Type 2 diabetes mellitus with diabetic chronic kidney disease: Secondary | ICD-10-CM | POA: Diagnosis present

## 2019-06-12 DIAGNOSIS — Z7902 Long term (current) use of antithrombotics/antiplatelets: Secondary | ICD-10-CM

## 2019-06-12 DIAGNOSIS — N179 Acute kidney failure, unspecified: Secondary | ICD-10-CM | POA: Diagnosis present

## 2019-06-12 DIAGNOSIS — N183 Chronic kidney disease, stage 3 unspecified: Secondary | ICD-10-CM | POA: Diagnosis present

## 2019-06-12 DIAGNOSIS — L97511 Non-pressure chronic ulcer of other part of right foot limited to breakdown of skin: Secondary | ICD-10-CM | POA: Diagnosis not present

## 2019-06-12 DIAGNOSIS — Z823 Family history of stroke: Secondary | ICD-10-CM

## 2019-06-12 DIAGNOSIS — E785 Hyperlipidemia, unspecified: Secondary | ICD-10-CM | POA: Diagnosis present

## 2019-06-12 DIAGNOSIS — Z20822 Contact with and (suspected) exposure to covid-19: Secondary | ICD-10-CM | POA: Diagnosis present

## 2019-06-12 DIAGNOSIS — E118 Type 2 diabetes mellitus with unspecified complications: Secondary | ICD-10-CM | POA: Diagnosis not present

## 2019-06-12 DIAGNOSIS — M6281 Muscle weakness (generalized): Secondary | ICD-10-CM | POA: Diagnosis not present

## 2019-06-12 DIAGNOSIS — B952 Enterococcus as the cause of diseases classified elsewhere: Secondary | ICD-10-CM | POA: Diagnosis present

## 2019-06-12 DIAGNOSIS — I2510 Atherosclerotic heart disease of native coronary artery without angina pectoris: Secondary | ICD-10-CM | POA: Diagnosis not present

## 2019-06-12 DIAGNOSIS — Z833 Family history of diabetes mellitus: Secondary | ICD-10-CM

## 2019-06-12 DIAGNOSIS — I129 Hypertensive chronic kidney disease with stage 1 through stage 4 chronic kidney disease, or unspecified chronic kidney disease: Secondary | ICD-10-CM | POA: Diagnosis present

## 2019-06-12 DIAGNOSIS — E871 Hypo-osmolality and hyponatremia: Secondary | ICD-10-CM | POA: Diagnosis present

## 2019-06-12 DIAGNOSIS — E876 Hypokalemia: Secondary | ICD-10-CM | POA: Diagnosis not present

## 2019-06-12 DIAGNOSIS — R112 Nausea with vomiting, unspecified: Secondary | ICD-10-CM | POA: Diagnosis not present

## 2019-06-12 DIAGNOSIS — E1143 Type 2 diabetes mellitus with diabetic autonomic (poly)neuropathy: Secondary | ICD-10-CM | POA: Diagnosis present

## 2019-06-12 DIAGNOSIS — E119 Type 2 diabetes mellitus without complications: Secondary | ICD-10-CM

## 2019-06-12 DIAGNOSIS — F419 Anxiety disorder, unspecified: Secondary | ICD-10-CM | POA: Diagnosis not present

## 2019-06-12 DIAGNOSIS — E039 Hypothyroidism, unspecified: Secondary | ICD-10-CM | POA: Diagnosis present

## 2019-06-12 DIAGNOSIS — A09 Infectious gastroenteritis and colitis, unspecified: Secondary | ICD-10-CM | POA: Diagnosis not present

## 2019-06-12 DIAGNOSIS — E1151 Type 2 diabetes mellitus with diabetic peripheral angiopathy without gangrene: Secondary | ICD-10-CM | POA: Diagnosis present

## 2019-06-12 DIAGNOSIS — E860 Dehydration: Secondary | ICD-10-CM | POA: Diagnosis present

## 2019-06-12 DIAGNOSIS — Z794 Long term (current) use of insulin: Secondary | ICD-10-CM

## 2019-06-12 DIAGNOSIS — Z89421 Acquired absence of other right toe(s): Secondary | ICD-10-CM | POA: Diagnosis not present

## 2019-06-12 DIAGNOSIS — Z953 Presence of xenogenic heart valve: Secondary | ICD-10-CM

## 2019-06-12 DIAGNOSIS — K219 Gastro-esophageal reflux disease without esophagitis: Secondary | ICD-10-CM | POA: Diagnosis not present

## 2019-06-12 DIAGNOSIS — Z9049 Acquired absence of other specified parts of digestive tract: Secondary | ICD-10-CM

## 2019-06-12 DIAGNOSIS — Z8719 Personal history of other diseases of the digestive system: Secondary | ICD-10-CM

## 2019-06-12 DIAGNOSIS — Z79899 Other long term (current) drug therapy: Secondary | ICD-10-CM

## 2019-06-12 DIAGNOSIS — R262 Difficulty in walking, not elsewhere classified: Secondary | ICD-10-CM | POA: Diagnosis not present

## 2019-06-12 DIAGNOSIS — J309 Allergic rhinitis, unspecified: Secondary | ICD-10-CM | POA: Diagnosis not present

## 2019-06-12 DIAGNOSIS — E11621 Type 2 diabetes mellitus with foot ulcer: Secondary | ICD-10-CM | POA: Diagnosis not present

## 2019-06-12 DIAGNOSIS — I517 Cardiomegaly: Secondary | ICD-10-CM | POA: Diagnosis not present

## 2019-06-12 DIAGNOSIS — B9561 Methicillin susceptible Staphylococcus aureus infection as the cause of diseases classified elsewhere: Secondary | ICD-10-CM | POA: Diagnosis not present

## 2019-06-12 LAB — CBC WITH DIFFERENTIAL/PLATELET
Abs Immature Granulocytes: 0.03 10*3/uL (ref 0.00–0.07)
Basophils Absolute: 0 10*3/uL (ref 0.0–0.1)
Basophils Relative: 0 %
Eosinophils Absolute: 0 10*3/uL (ref 0.0–0.5)
Eosinophils Relative: 0 %
HCT: 39.7 % (ref 39.0–52.0)
Hemoglobin: 12.9 g/dL — ABNORMAL LOW (ref 13.0–17.0)
Immature Granulocytes: 0 %
Lymphocytes Relative: 3 %
Lymphs Abs: 0.2 10*3/uL — ABNORMAL LOW (ref 0.7–4.0)
MCH: 30.4 pg (ref 26.0–34.0)
MCHC: 32.5 g/dL (ref 30.0–36.0)
MCV: 93.6 fL (ref 80.0–100.0)
Monocytes Absolute: 0.2 10*3/uL (ref 0.1–1.0)
Monocytes Relative: 3 %
Neutro Abs: 7.9 10*3/uL — ABNORMAL HIGH (ref 1.7–7.7)
Neutrophils Relative %: 94 %
Platelets: 210 10*3/uL (ref 150–400)
RBC: 4.24 MIL/uL (ref 4.22–5.81)
RDW: 13.8 % (ref 11.5–15.5)
WBC: 8.4 10*3/uL (ref 4.0–10.5)
nRBC: 0 % (ref 0.0–0.2)

## 2019-06-12 LAB — C DIFFICILE QUICK SCREEN W PCR REFLEX
C Diff antigen: POSITIVE — AB
C Diff toxin: NEGATIVE

## 2019-06-12 LAB — COMPREHENSIVE METABOLIC PANEL
ALT: 15 U/L (ref 0–44)
AST: 18 U/L (ref 15–41)
Albumin: 3.7 g/dL (ref 3.5–5.0)
Alkaline Phosphatase: 41 U/L (ref 38–126)
Anion gap: 13 (ref 5–15)
BUN: 25 mg/dL — ABNORMAL HIGH (ref 8–23)
CO2: 23 mmol/L (ref 22–32)
Calcium: 9.2 mg/dL (ref 8.9–10.3)
Chloride: 96 mmol/L — ABNORMAL LOW (ref 98–111)
Creatinine, Ser: 1.36 mg/dL — ABNORMAL HIGH (ref 0.61–1.24)
GFR calc Af Amer: 60 mL/min — ABNORMAL LOW (ref >60)
GFR calc non Af Amer: 52 mL/min — ABNORMAL LOW (ref >60)
Glucose, Bld: 146 mg/dL — ABNORMAL HIGH (ref 70–99)
Potassium: 3.1 mmol/L — ABNORMAL LOW (ref 3.5–5.1)
Sodium: 132 mmol/L — ABNORMAL LOW (ref 135–145)
Total Bilirubin: 1 mg/dL (ref 0.3–1.2)
Total Protein: 7.9 g/dL (ref 6.5–8.1)

## 2019-06-12 LAB — SARS CORONAVIRUS 2 (TAT 6-24 HRS): SARS Coronavirus 2: NEGATIVE

## 2019-06-12 LAB — CBC
HCT: 38 % — ABNORMAL LOW (ref 39.0–52.0)
Hemoglobin: 12.6 g/dL — ABNORMAL LOW (ref 13.0–17.0)
MCH: 30.9 pg (ref 26.0–34.0)
MCHC: 33.2 g/dL (ref 30.0–36.0)
MCV: 93.1 fL (ref 80.0–100.0)
Platelets: 185 10*3/uL (ref 150–400)
RBC: 4.08 MIL/uL — ABNORMAL LOW (ref 4.22–5.81)
RDW: 13.6 % (ref 11.5–15.5)
WBC: 7.4 10*3/uL (ref 4.0–10.5)
nRBC: 0 % (ref 0.0–0.2)

## 2019-06-12 LAB — GLUCOSE, CAPILLARY
Glucose-Capillary: 138 mg/dL — ABNORMAL HIGH (ref 70–99)
Glucose-Capillary: 149 mg/dL — ABNORMAL HIGH (ref 70–99)
Glucose-Capillary: 162 mg/dL — ABNORMAL HIGH (ref 70–99)

## 2019-06-12 LAB — CLOSTRIDIUM DIFFICILE BY PCR, REFLEXED: Toxigenic C. Difficile by PCR: NEGATIVE

## 2019-06-12 LAB — CREATININE, SERUM
Creatinine, Ser: 1.21 mg/dL (ref 0.61–1.24)
GFR calc Af Amer: >60 mL/min (ref >60)
GFR calc non Af Amer: 59 mL/min — ABNORMAL LOW (ref >60)

## 2019-06-12 LAB — PROTIME-INR
INR: 1.1 (ref 0.8–1.2)
Prothrombin Time: 13.7 seconds (ref 11.4–15.2)

## 2019-06-12 LAB — CBG MONITORING, ED: Glucose-Capillary: 145 mg/dL — ABNORMAL HIGH (ref 70–99)

## 2019-06-12 LAB — LIPASE, BLOOD: Lipase: 18 U/L (ref 11–51)

## 2019-06-12 LAB — MAGNESIUM: Magnesium: 1.5 mg/dL — ABNORMAL LOW (ref 1.7–2.4)

## 2019-06-12 MED ORDER — METOCLOPRAMIDE HCL 10 MG PO TABS: 10.0000 mg | ORAL_TABLET | Freq: Three times a day (TID) | ORAL | Status: DC | PRN

## 2019-06-12 MED ORDER — LORATADINE 10 MG PO TABS
10.0000 mg | ORAL_TABLET | Freq: Every day | ORAL | Status: DC
  Administered 2019-06-12 – 2019-06-15 (×4): 10 mg via ORAL
  Filled 2019-06-12 (×5): qty 1

## 2019-06-12 MED ORDER — METOCLOPRAMIDE HCL 10 MG PO TABS
10.0000 mg | ORAL_TABLET | Freq: Three times a day (TID) | ORAL | Status: DC
  Administered 2019-06-13 – 2019-06-15 (×9): 10 mg via ORAL
  Filled 2019-06-12 (×10): qty 1

## 2019-06-12 MED ORDER — AMLODIPINE BESYLATE 5 MG PO TABS
10.0000 mg | ORAL_TABLET | Freq: Every day | ORAL | Status: DC
  Administered 2019-06-12 – 2019-06-15 (×4): 10 mg via ORAL
  Filled 2019-06-12 (×4): qty 2

## 2019-06-12 MED ORDER — INSULIN ASPART 100 UNIT/ML ~~LOC~~ SOLN: 0.0000 [IU] | Freq: Every day | SUBCUTANEOUS | Status: DC

## 2019-06-12 MED ORDER — ACETAMINOPHEN 325 MG PO TABS: 650.0000 mg | ORAL_TABLET | Freq: Four times a day (QID) | ORAL | Status: DC | PRN

## 2019-06-12 MED ORDER — NITROGLYCERIN 0.4 MG SL SUBL: 0.4000 mg | SUBLINGUAL_TABLET | SUBLINGUAL | Status: DC | PRN

## 2019-06-12 MED ORDER — ONDANSETRON HCL 4 MG PO TABS: 4.0000 mg | ORAL_TABLET | Freq: Four times a day (QID) | ORAL | Status: DC | PRN

## 2019-06-12 MED ORDER — SODIUM CHLORIDE 0.9 % IV BOLUS
500.0000 mL | Freq: Once | INTRAVENOUS | Status: AC
  Administered 2019-06-12: 500 mL via INTRAVENOUS

## 2019-06-12 MED ORDER — ONDANSETRON HCL 4 MG/2ML IJ SOLN
4.0000 mg | Freq: Once | INTRAMUSCULAR | Status: AC
  Administered 2019-06-12: 4 mg via INTRAVENOUS
  Filled 2019-06-12: qty 2

## 2019-06-12 MED ORDER — LEVOTHYROXINE SODIUM 50 MCG PO TABS
50.0000 ug | ORAL_TABLET | Freq: Every day | ORAL | Status: DC
  Administered 2019-06-14 – 2019-06-15 (×2): 50 ug via ORAL
  Filled 2019-06-12 (×2): qty 1

## 2019-06-12 MED ORDER — GABAPENTIN 300 MG PO CAPS
1200.0000 mg | ORAL_CAPSULE | Freq: Three times a day (TID) | ORAL | Status: DC
  Administered 2019-06-12 – 2019-06-15 (×10): 1200 mg via ORAL
  Filled 2019-06-12 (×10): qty 4

## 2019-06-12 MED ORDER — ISOSORBIDE MONONITRATE ER 30 MG PO TB24
30.0000 mg | ORAL_TABLET | Freq: Every day | ORAL | Status: DC
  Administered 2019-06-13 – 2019-06-15 (×3): 30 mg via ORAL
  Filled 2019-06-12 (×4): qty 1

## 2019-06-12 MED ORDER — MAGNESIUM SULFATE 2 GM/50ML IV SOLN
2.0000 g | Freq: Once | INTRAVENOUS | Status: AC
  Administered 2019-06-12: 2 g via INTRAVENOUS
  Filled 2019-06-12: qty 50

## 2019-06-12 MED ORDER — CLOPIDOGREL BISULFATE 75 MG PO TABS
75.0000 mg | ORAL_TABLET | Freq: Every day | ORAL | Status: DC
  Administered 2019-06-12 – 2019-06-15 (×4): 75 mg via ORAL
  Filled 2019-06-12 (×4): qty 1

## 2019-06-12 MED ORDER — INSULIN ASPART 100 UNIT/ML ~~LOC~~ SOLN
0.0000 [IU] | SUBCUTANEOUS | Status: DC
  Administered 2019-06-12: 3 [IU] via SUBCUTANEOUS
  Administered 2019-06-12 – 2019-06-13 (×3): 2 [IU] via SUBCUTANEOUS
  Administered 2019-06-13 (×2): 3 [IU] via SUBCUTANEOUS
  Administered 2019-06-13: 5 [IU] via SUBCUTANEOUS
  Administered 2019-06-13: 2 [IU] via SUBCUTANEOUS
  Administered 2019-06-14: 5 [IU] via SUBCUTANEOUS
  Administered 2019-06-14: 4 [IU] via SUBCUTANEOUS
  Administered 2019-06-14 (×2): 3 [IU] via SUBCUTANEOUS
  Administered 2019-06-14: 8 [IU] via SUBCUTANEOUS
  Administered 2019-06-14: 2 [IU] via SUBCUTANEOUS
  Administered 2019-06-15 (×2): 8 [IU] via SUBCUTANEOUS
  Administered 2019-06-15 (×3): 3 [IU] via SUBCUTANEOUS

## 2019-06-12 MED ORDER — ACETAMINOPHEN 650 MG RE SUPP: 650.0000 mg | Freq: Four times a day (QID) | RECTAL | Status: DC | PRN

## 2019-06-12 MED ORDER — POTASSIUM CHLORIDE CRYS ER 20 MEQ PO TBCR
20.0000 meq | EXTENDED_RELEASE_TABLET | Freq: Two times a day (BID) | ORAL | Status: DC
  Administered 2019-06-12 – 2019-06-15 (×6): 20 meq via ORAL
  Filled 2019-06-12 (×5): qty 1
  Filled 2019-06-12: qty 2

## 2019-06-12 MED ORDER — ONDANSETRON HCL 4 MG/2ML IJ SOLN
4.0000 mg | Freq: Four times a day (QID) | INTRAMUSCULAR | Status: DC | PRN
  Administered 2019-06-13: 4 mg via INTRAVENOUS
  Filled 2019-06-12: qty 2

## 2019-06-12 MED ORDER — HEPARIN SODIUM (PORCINE) 5000 UNIT/ML IJ SOLN
5000.0000 [IU] | Freq: Three times a day (TID) | INTRAMUSCULAR | Status: DC
  Administered 2019-06-13 – 2019-06-14 (×2): 5000 [IU] via SUBCUTANEOUS
  Filled 2019-06-12 (×3): qty 1

## 2019-06-12 MED ORDER — POTASSIUM CHLORIDE IN NACL 20-0.9 MEQ/L-% IV SOLN: INTRAVENOUS | Status: AC

## 2019-06-12 MED ORDER — PIPERACILLIN-TAZOBACTAM IV (FOR PTA / DISCHARGE USE ONLY): 13.5000 g | INTRAVENOUS | Status: DC

## 2019-06-12 MED ORDER — ESCITALOPRAM OXALATE 10 MG PO TABS
10.0000 mg | ORAL_TABLET | Freq: Every day | ORAL | Status: DC
  Administered 2019-06-12 – 2019-06-15 (×4): 10 mg via ORAL
  Filled 2019-06-12 (×4): qty 1

## 2019-06-12 MED ORDER — PIPERACILLIN-TAZOBACTAM 3.375 G IVPB
3.3750 g | Freq: Three times a day (TID) | INTRAVENOUS | Status: DC
  Administered 2019-06-12 – 2019-06-15 (×10): 3.375 g via INTRAVENOUS
  Filled 2019-06-12 (×10): qty 50

## 2019-06-12 MED ORDER — POTASSIUM CHLORIDE 10 MEQ/100ML IV SOLN
10.0000 meq | INTRAVENOUS | Status: AC
  Administered 2019-06-12 (×2): 10 meq via INTRAVENOUS
  Filled 2019-06-12 (×2): qty 100

## 2019-06-12 MED ORDER — ASPIRIN EC 81 MG PO TBEC
81.0000 mg | DELAYED_RELEASE_TABLET | Freq: Every day | ORAL | Status: DC
  Administered 2019-06-12 – 2019-06-15 (×4): 81 mg via ORAL
  Filled 2019-06-12 (×4): qty 1

## 2019-06-12 NOTE — Plan of Care (Signed)

## 2019-06-12 NOTE — ED Triage Notes (Signed)
Pt having vomiting and diarrhea since last night

## 2019-06-12 NOTE — Progress Notes (Signed)
Wound assessed to Right Foot. Patient requested not to remove thick foam dressing applied to right lower foot. Patient stated they just placed that dressing at home and it needs to be on there a few more days. Right upper foot wound assessed and cleansed. Yellow and red in color. Gauze dressing applied and wrapped in Kerlex. Foam applied to left, medial ankle wound.

## 2019-06-12 NOTE — H&P (Addendum)
History and Physical    [name] AUQ:[id number] DOB: Jan 28, 1946 DOA: 06/12/2019  PCP: Rory Percy, MD   Patient coming from: Home  Chief Complaint: Diarrhea, N/V  HPI: Larry Hart is a 74 y.o. male with medical history significant for recent MSSA and enterococcal bacteremia related to diabetic wound infection-currently on Zosyn, type 2 diabetes, hypertension, dyslipidemia, peripheral vascular disease status post right transmetatarsal foot amputation, hypothyroidism, CKD stage III, and prior TAVR with bioprosthetic aortic valve who was brought to the ED with explosive diarrhea along with some nausea and vomiting that began early this morning around 3 AM.  Wife at bedside is the primary historian as patient appears somewhat lethargic.  She states that he had an explosive bowel movement around 3 AM during his sleep and she helped him clean up after which point in time he fell back asleep.  He had another 2 or 3 bowel movements that were liquidy along with nausea and vomiting for couple episodes.  He cannot tolerate anything by mouth and therefore brought him to the ED for further evaluation.  She states she checked his temperature at home and it was 97.8 Fahrenheit.  No chills, abdominal pain, chest pain, shortness of breath, or other concerns noted.  He has been taking his home IV Zosyn as prescribed on recent discharge and has even seen wound care with improvement in his infection noted overall.  His wife is also concerned about the patient becoming more weak lately and has not been able to ambulate very well despite home health physical therapy seeing him.   ED Course: Stable vital signs noted and patient is afebrile.  Potassium is 3.1 and magnesium is 1.5 which are being repleted.  BUN 25 and creatinine 1.36 which is in line with his stage III CKD.  Chest x-ray with no acute findings and PICC line is present.  His foot dressings appear to be clean dry and intact and are changed regularly.   Patient does make eye contact but does not give much verbal history and shifts around in bed.  Stool C. difficile as well as GI panel has been ordered for collection, but he has not had a bowel movement since he has come to the ED.  Review of Systems: Cannot be obtained given patient condition.  Past Medical History:  Diagnosis Date  . Cancer ([place])    skin  . Colon polyp    Status post hemicolectomy  . Diabetes mellitus ([place])   . Dyslipidemia   . Dyspnea on exertion   . Hypertension   . Low serum triglycerides   . Low testosterone   . Osteomyelitis ([place])    Status post amputation  . Peripheral vascular disease ([place])   . Sinus bradycardia   . Thyroid disorder    low    Past Surgical History:  Procedure Laterality Date  . AMPUTATION     x5  . HEMICOLECTOMY    . OTHER SURGICAL HISTORY     gastroinstestinal surgery  . TEE WITHOUT CARDIOVERSION N/A 05/28/2019   Procedure: TRANSESOPHAGEAL ECHOCARDIOGRAM (TEE) WITH PROPOFOL;  Surgeon: Herminio Commons, MD;  Location: [facility];  Service: Cardiovascular;  Laterality: N/A;     reports that he has never smoked. He has never used smokeless tobacco. He reports that he does not drink alcohol or use drugs.  Allergies  Allergen Reactions  . Honey Bee Treatment [Bee Venom]   . Other Palpitations and Rash    Histalet, tears up nerves,possible rash  Family History  Problem Relation Age of Onset  . Coronary artery disease Father 30  . Hypertension Mother   . Stroke Mother   . Diabetes Other     Prior to Admission medications   Medication Sig Start Date End Date Taking? Authorizing Provider  amLODipine (NORVASC) 10 MG tablet Take 10 mg by mouth daily. 04/30/19   [provider]  Ascorbic Acid (VITAMIN C) 1000 MG tablet Take 1,000 mg by mouth daily.    [provider]  aspirin 81 MG tablet Take 81 mg by mouth daily.     [provider]  cetirizine (ZYRTEC) 10 MG tablet Take 10 mg by mouth daily  as needed for allergies.    [provider]  chlorthalidone (HYGROTON) 25 MG tablet Take 25 mg by mouth daily. 05/03/19   [provider]  Cholecalciferol (VITAMIN D3) 2000 UNITS TABS Take 1 tablet by mouth daily.     [provider]  clopidogrel (PLAVIX) 75 MG tablet Take 75 mg by mouth daily. 04/30/19   [provider]  Coenzyme Q10 100 MG TABS Take 1 tablet by mouth daily.     [provider]  escitalopram (LEXAPRO) 10 MG tablet Take 10 mg by mouth daily.     [provider]  fenofibrate 160 MG tablet Take 160 mg by mouth daily.    [provider]  gabapentin (NEURONTIN) 600 MG tablet Take 1,200 mg by mouth 3 (three) times daily.     [provider]  insulin glargine (LANTUS) 100 UNIT/ML injection Inject 30 Units into the skin 2 (two) times daily.     [provider]  isosorbide mononitrate (IMDUR) 30 MG 24 hr tablet Take 30 mg by mouth daily. 02/09/19   [provider]  ketoconazole (NIZORAL) 2 % cream Apply 1 application topically daily as needed for irritation.  05/13/14   [provider]  levothyroxine (SYNTHROID, LEVOTHROID) 50 MCG tablet Take 50 mcg by mouth daily before breakfast.    [provider]  losartan (COZAAR) 100 MG tablet Take 100 mg by mouth daily. 04/30/19   [provider]  metoCLOPramide (REGLAN) 10 MG tablet Take 10 mg by mouth every 8 (eight) hours as needed for nausea or vomiting.  05/22/19   [provider]  Multiple Vitamin (MULTIVITAMIN) capsule Take 1 capsule by mouth daily. Adults 50+    [provider]  NITROSTAT 0.4 MG SL tablet Place 0.4 mg under the tongue every 5 (five) minutes as needed for chest pain.  01/06/12   [provider]  NOVOLIN R RELION 100 UNIT/ML injection Inject 20-50 Units into the skin 3 (three) times daily before meals. Per sliding scale as directed per dietician 03/18/19   [provider]  omega-3 acid  ethyl esters (LOVAZA) 1 G capsule Take 2 g by mouth 2 (two) times daily.    [provider]  pantoprazole (PROTONIX) 40 MG tablet Take 40 mg by mouth daily. 05/22/19   [provider]  piperacillin-tazobactam (ZOSYN) IVPB Inject 13.5 g into the vein daily for 20 days. As a continuous infusion  Indication:  Bacteremia and diabetic foot infection Last Day of Therapy:  06/18/2019 Labs - Once weekly:  CBC/D and BMP, Labs - Every other week:  ESR and CRP 05/29/19 06/18/19  Manuella Ghazi, Byrdie Miyazaki D, DO  potassium chloride SA (KLOR-CON M20) 20 MEQ tablet Take 20 mEq by mouth 2 (two) times daily.  05/09/17   [provider]  pravastatin (PRAVACHOL)  40 MG tablet Take 40 mg by mouth daily. 04/30/19   [provider]    Physical Exam: Vitals:   06/12/19 1126 06/12/19 1130 06/12/19 1200 06/12/19 1300  BP:  (!) 148/65 (!) 156/67 (!) 151/65  Pulse:  76 75 82  Resp:  18    Temp:  99.1 F (37.3 C)    TempSrc:  Oral    SpO2:  93% 98% 96%  Weight: 90.7 kg     Height: _0  (1.753 m)       Constitutional: NAD, currently nonverbal, but does make some eye contact Vitals:   06/12/19 1126 06/12/19 1130 06/12/19 1200 06/12/19 1300  BP:  (!) 148/65 (!) 156/67 (!) 151/65  Pulse:  76 75 82  Resp:  18    Temp:  99.1 F (37.3 C)    TempSrc:  Oral    SpO2:  93% 98% 96%  Weight: 90.7 kg     Height: _1  (1.753 m)      Eyes: lids and conjunctivae normal ENMT: Mucous membranes are moist.  Neck: normal, supple Respiratory: clear to auscultation bilaterally. Normal respiratory effort. No accessory muscle use.  Cardiovascular: Regular rate and rhythm, no murmurs. No extremity edema. Abdomen: no tenderness, no distention. Bowel sounds positive.  Musculoskeletal: Right foot transmetatarsal amputation with dressings clean dry and intact. Skin: no rashes, lesions, ulcers.  Psychiatric: Cannot be assessed given current condition.  Labs on Admission: I have personally reviewed following labs  and imaging studies  CBC: Recent Labs  Lab 06/12/19 1152  WBC 8.4  NEUTROABS 7.9*  HGB 12.9*  HCT 39.7  MCV 93.6  PLT 782   Basic Metabolic Panel: Recent Labs  Lab 06/12/19 1152  NA 132*  K 3.1*  CL 96*  CO2 23  GLUCOSE 146*  BUN 25*  CREATININE 1.36*  CALCIUM 9.2  MG 1.5*   GFR: Estimated Creatinine Clearance: 54.7 mL/min (A) (by C-G formula based on SCr of 1.36 mg/dL (H)). Liver Function Tests: Recent Labs  Lab 06/12/19 1152  AST 18  ALT 15  ALKPHOS 41  BILITOT 1.0  PROT 7.9  ALBUMIN 3.7   Recent Labs  Lab 06/12/19 1152  LIPASE 18   No results for input(s): AMMONIA in the last 168 hours. Coagulation Profile: Recent Labs  Lab 06/12/19 1152  INR 1.1   Cardiac Enzymes: No results for input(s): CKTOTAL, CKMB, CKMBINDEX, TROPONINI in the last 168 hours. BNP (last 3 results) No results for input(s): PROBNP in the last 8760 hours. HbA1C: No results for input(s): HGBA1C in the last 72 hours. CBG: Recent Labs  Lab 06/12/19 1228  GLUCAP 145*   Lipid Profile: No results for input(s): CHOL, HDL, LDLCALC, TRIG, CHOLHDL, LDLDIRECT in the last 72 hours. Thyroid Function Tests: No results for input(s): TSH, T4TOTAL, FREET4, T3FREE, THYROIDAB in the last 72 hours. Anemia Panel: No results for input(s): VITAMINB12, FOLATE, FERRITIN, TIBC, IRON, RETICCTPCT in the last 72 hours. Urine analysis:    Component Value Date/Time   COLORURINE YELLOW 05/22/2019 1952   APPEARANCEUR CLEAR 05/22/2019 1952   LABSPEC 1.026 05/22/2019 1952   PHURINE 5.0 05/22/2019 1952   GLUCOSEU >=500 (A) 05/22/2019 1952   HGBUR SMALL (A) 05/22/2019 1952   BILIRUBINUR NEGATIVE 05/22/2019 1952   KETONESUR 20 (A) 05/22/2019 1952   PROTEINUR 30 (A) 05/22/2019 1952   NITRITE NEGATIVE 05/22/2019 1952   LEUKOCYTESUR NEGATIVE 05/22/2019 1952    Radiological Exams on Admission: DG Chest Port 1 View  Result Date: 06/12/2019  CLINICAL DATA:  Weakness. Additional history provided by  technologist: Vomiting and diarrhea since last night. EXAM: PORTABLE CHEST 1 VIEW COMPARISON:  Chest radiograph 05/24/2019 FINDINGS: Right-sided PICC with tip projecting in the region of the caudal SVC. Prior median sternotomy. Unchanged cardiomegaly with aortic valve prosthesis. Aortic atherosclerosis. Pulmonary edema demonstrated on chest radiograph 05/24/2019 has resolved. No evidence of airspace consolidation within the lungs. No evidence of pleural effusion or pneumothorax. No acute bony abnormality. IMPRESSION: No evidence of acute cardiopulmonary abnormality. Cardiomegaly with evidence of prior median sternotomy and aortic valve replacement. Aortic atherosclerosis. Electronically Signed   By: Kellie Simmering DO   On: 06/12/2019 12:25    Assessment/Plan Active Problems:   Diarrhea    Mild dehydration secondary to acute onset diarrhea with nausea and vomiting -Stool GI panel and C. difficile ordered for further evaluation -Zofran as needed for nausea and vomiting -Maintain on IV fluid with potassium supplementation for hydration -Probiotics -Maintain on Zosyn for now -Hold home PPI for now -Patient noted to have some history of diabetic gastroparesis and I will restart home Reglan   Hypomagnesemia/hypokalemia/hyponatremia secondary to above -Continue repletion and reevaluate with labs in a.m. -Continue normal saline as hyponatremia is likely related to his dehydration along with chlorthalidone use  Recent MSSA and enterococcal bacteremia from infected diabetic foot ulcer currently on Zosyn -Zosyn per pharmacy dosing through 3/8 per ID recommendations  Type 2 diabetes-controlled -Continue on SSI for now -Restart home long-acting insulin as well as mealtime insulin once diet has stabilized  Diabetic neuropathy -Continue gabapentin  Stage III CKD -Monitor while on gentle IV fluid -Monitor daily weights  Peripheral arterial disease with prior right transmetatarsal  amputation -Continue aspirin and Plavix  History of TAVR and bioprosthetic aortic valve -Recent TTE and TEE with LVEF 60-65%  Dyslipidemia -Hold fenofibrate and pravastatin for now until diet tolerated -Hold fish oil  History of hypertension -Currently with mildly elevated blood pressures, maintain on amlodipine -Hold chlorthalidone  Hypothyroidism -Continue levothyroxine  Physical deconditioning/weakness -PT reevaluation as home health PT does not appear to be helping -Fall precautions  DVT prophylaxis: Heparin Code Status: DNR Family Communication: Wife at bedside Disposition Plan: Admit for evaluation of diarrhea and vomiting with IV fluid for hydration Consults called: None Admission status: Inpatient, telemetry   Kenadi Miltner D Ceili Boshers DO [facility]  If 7PM-7AM, please contact night-coverage [email or web address]  06/12/2019, 1:38 PM

## 2019-06-12 NOTE — ED Provider Notes (Signed)
[facility] EMERGENCY DEPARTMENT Provider Note   CSN: [id number] Arrival date & time: 06/12/19  1103     History Chief Complaint  Patient presents with  . Emesis    Larry Hart is a 74 y.o. male.  Level 5 caveat secondary to altered mental status.  He is a history of diabetes peripheral vascular disease with a fairly recent transmetatarsal amputation on the right.  He is on IV antibiotics.  He is brought in by his wife after becoming sick last evening with nausea and vomiting.  He is able to answer some questions but also seems to fall asleep during history.  He has an IV pump running likely with antibiotics to a PICC line.  There is no reported blood in the vomitus or diarrhea.  He denies any fevers chest pain shortness of breath abdominal pain.  He was admitted on February 9 for sepsis.  The history is provided by the patient. The history is limited by the condition of the patient.  Emesis Severity:  Moderate Number of daily episodes:  4 Progression:  Unchanged Chronicity:  New Relieved by:  Nothing Worsened by:  Nothing Ineffective treatments:  None tried Associated symptoms: diarrhea   Associated symptoms: no abdominal pain, no cough and no fever   Risk factors: diabetes        Past Medical History:  Diagnosis Date  . Cancer ([place])    skin  . Colon polyp    Status post hemicolectomy  . Diabetes mellitus ([place])   . Dyslipidemia   . Dyspnea on exertion   . Hypertension   . Low serum triglycerides   . Low testosterone   . Osteomyelitis ([place])    Status post amputation  . Peripheral vascular disease ([place])   . Sinus bradycardia   . Thyroid disorder    low    Patient Active Problem List   Diagnosis Date Noted  . Sepsis ([place]) 05/23/2019  . DKA (diabetic ketoacidoses) ([place]) 05/23/2019  . CKD (chronic kidney disease) stage 3, GFR 30-59 ml/min 05/23/2019  . CAD (coronary artery disease) 05/23/2019  . S/P CABG (coronary artery bypass graft) 05/23/2019  . PAD  (peripheral artery disease) ([place]) 05/23/2019  . Dyslipidemia 05/23/2019  . Hypothyroid 05/23/2019  . 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 S/P transmetatarsal amputation of foot, right ([place]) 05/23/2019  . Diabetic foot ulcer ([place]) 05/23/2019  . Normocytic anemia 05/23/2019  . Thrombocytopenia ([place]) 05/23/2019  . Bacteremia due to methicillin susceptible Staphylococcus aureus (MSSA) 05/23/2019  . Enterococcal bacteremia 05/23/2019  . Hypertension   . Diabetes mellitus ([place])     Past Surgical History:  Procedure Laterality Date  . AMPUTATION     x5  . HEMICOLECTOMY    . OTHER SURGICAL HISTORY     gastroinstestinal surgery  . TEE WITHOUT CARDIOVERSION N/A 05/28/2019   Procedure: TRANSESOPHAGEAL ECHOCARDIOGRAM (TEE) WITH PROPOFOL;  Surgeon: Herminio Commons, MD;  Location: [facility] ENDO SUITE;  Service: Cardiovascular;  Laterality: N/A;       Family History  Problem Relation Age of Onset  . Coronary artery disease Father 35  . Hypertension Mother   . Stroke Mother   . Diabetes Other     Social History   Tobacco Use  . Smoking status: Never Smoker  . Smokeless tobacco: Never Used  Substance Use Topics  . Alcohol use: No    Alcohol/week: 0.0 standard drinks  . Drug use: No    Home Medications Prior to Admission medications  Medication Sig Start Date End Date Taking? Authorizing Provider  amLODipine (NORVASC) 10 MG tablet Take 10 mg by mouth daily. 04/30/19   [provider]  Ascorbic Acid (VITAMIN C) 1000 MG tablet Take 1,000 mg by mouth daily.    [provider]  aspirin 81 MG tablet Take 81 mg by mouth daily.     [provider]  cetirizine (ZYRTEC) 10 MG tablet Take 10 mg by mouth daily as needed for allergies.    [provider]  chlorthalidone (HYGROTON) 25 MG tablet Take 25 mg by mouth daily. 05/03/19   [provider]  Cholecalciferol (VITAMIN D3) 2000 UNITS TABS Take 1 tablet by mouth daily.      [provider]  clopidogrel (PLAVIX) 75 MG tablet Take 75 mg by mouth daily. 04/30/19   [provider]  Coenzyme Q10 100 MG TABS Take 1 tablet by mouth daily.     [provider]  escitalopram (LEXAPRO) 10 MG tablet Take 10 mg by mouth daily.     [provider]  fenofibrate 160 MG tablet Take 160 mg by mouth daily.    [provider]  gabapentin (NEURONTIN) 600 MG tablet Take 1,200 mg by mouth 3 (three) times daily.     [provider]  insulin glargine (LANTUS) 100 UNIT/ML injection Inject 30 Units into the skin 2 (two) times daily.     [provider]  isosorbide mononitrate (IMDUR) 30 MG 24 hr tablet Take 30 mg by mouth daily. 02/09/19   [provider]  ketoconazole (NIZORAL) 2 % cream Apply 1 application topically daily as needed for irritation.  05/13/14   [provider]  levothyroxine (SYNTHROID, LEVOTHROID) 50 MCG tablet Take 50 mcg by mouth daily before breakfast.    [provider]  losartan (COZAAR) 100 MG tablet Take 100 mg by mouth daily. 04/30/19   [provider]  metoCLOPramide (REGLAN) 10 MG tablet Take 10 mg by mouth every 8 (eight) hours as needed for nausea or vomiting.  05/22/19   [provider]  Multiple Vitamin (MULTIVITAMIN) capsule Take 1 capsule by mouth daily. Adults 50+    [provider]  NITROSTAT 0.4 MG SL tablet Place 0.4 mg under the tongue every 5 (five) minutes as needed for chest pain.  01/06/12   [provider]  NOVOLIN R RELION 100 UNIT/ML injection Inject 20-50 Units into the skin 3 (three) times daily before meals. Per sliding scale as directed per dietician 03/18/19   [provider]  omega-3 acid ethyl esters (LOVAZA) 1 G capsule Take 2 g by mouth 2 (two) times daily.    [provider]  pantoprazole (PROTONIX) 40 MG tablet Take 40 mg by mouth daily. 05/22/19   [provider]  piperacillin-tazobactam (ZOSYN)  IVPB Inject 13.5 g into the vein daily for 20 days. As a continuous infusion  Indication:  Bacteremia and diabetic foot infection Last Day of Therapy:  06/18/2019 Labs - Once weekly:  CBC/D and BMP, Labs - Every other week:  ESR and CRP 05/29/19 06/18/19  Manuella Ghazi, Pratik D, DO  potassium chloride SA (KLOR-CON M20) 20 MEQ tablet Take 20 mEq by mouth 2 (two) times daily.  05/09/17   [provider]  pravastatin (PRAVACHOL) 40 MG tablet Take 40 mg by mouth daily. 04/30/19   [provider]    Allergies    Honey bee treatment [bee venom] and Other  Review of Systems   Review of Systems  Unable to perform ROS: Mental status change  Constitutional: Negative for fever.  Respiratory: Negative for cough.   Cardiovascular: Negative for chest pain.  Gastrointestinal: Positive for diarrhea and vomiting. Negative for abdominal pain.    Physical Exam Updated Vital Signs BP (!) 148/65 (BP Location: Left Arm)   Pulse 76   Temp 99.1 F (37.3 C) (Oral)   Resp 18   Ht _0  (1.753 m)   Wt 90.7 kg   SpO2 93%   BMI 29.53 kg/m   Physical Exam Vitals and nursing note reviewed.  Constitutional:      Appearance: He is well-developed.  HENT:     Head: Normocephalic and atraumatic.  Eyes:     Conjunctiva/sclera: Conjunctivae normal.  Cardiovascular:     Rate and Rhythm: Normal rate and regular rhythm.     Pulses: Normal pulses.     Heart sounds: No murmur.  Pulmonary:     Effort: Pulmonary effort is normal. No respiratory distress.     Breath sounds: Normal breath sounds.  Abdominal:     Palpations: Abdomen is soft.     Tenderness: There is no abdominal tenderness. There is no guarding or rebound.  Musculoskeletal:        General: Deformity present.     Cervical back: Neck supple.     Comments: Right foot bandaged no overlying erythema.  Skin:    General: Skin is warm and dry.  Neurological:     Mental Status: He is lethargic.     Comments: Patient will not consistently  answer questions.  Moving all extremities nonfocal he.     ED Results / Procedures / Treatments   Labs (all labs ordered are listed, but only abnormal results are displayed) Labs Reviewed  C DIFFICILE QUICK SCREEN W PCR REFLEX - Abnormal; Notable for the following components:      Result Value   C Diff antigen POSITIVE (*)    All other components within normal limits  COMPREHENSIVE METABOLIC PANEL - Abnormal; Notable for the following components:   Sodium 132 (*)    Potassium 3.1 (*)    Chloride 96 (*)    Glucose, Bld 146 (*)    BUN 25 (*)    Creatinine, Ser 1.36 (*)    GFR calc non Af Amer 52 (*)    GFR calc Af Amer 60 (*)    All other components within normal limits  CBC WITH DIFFERENTIAL/PLATELET - Abnormal; Notable for the following components:   Hemoglobin 12.9 (*)    Neutro Abs 7.9 (*)    Lymphs Abs 0.2 (*)    All other components within normal limits  MAGNESIUM - Abnormal; Notable for the following components:   Magnesium 1.5 (*)    All other components within normal limits  CBC - Abnormal; Notable for the following components:   RBC 4.08 (*)    Hemoglobin 12.6 (*)    HCT 38.0 (*)    All other components within normal limits  CREATININE, SERUM - Abnormal; Notable for the following components:   GFR calc non Af Amer 59 (*)    All other components within normal limits  GLUCOSE, CAPILLARY - Abnormal; Notable for the following components:   Glucose-Capillary 138 (*)    All other components within normal limits  CBG MONITORING, ED - Abnormal; Notable for the following components:   Glucose-Capillary 145 (*)    All other components within normal limits  CULTURE, BLOOD (ROUTINE X 2)  CULTURE, BLOOD (ROUTINE X  2)  GI PATHOGEN PANEL BY PCR, STOOL  SARS CORONAVIRUS 2 (TAT 6-24 HRS)  CLOSTRIDIUM DIFFICILE BY PCR, REFLEXED  LIPASE, BLOOD  PROTIME-INR  LACTIC ACID, PLASMA  LACTIC ACID, PLASMA  MAGNESIUM  COMPREHENSIVE METABOLIC PANEL  CBC     EKG None  Radiology DG Chest Port 1 View  Result Date: 06/12/2019 CLINICAL DATA:  Weakness. Additional history provided by technologist: Vomiting and diarrhea since last night. EXAM: PORTABLE CHEST 1 VIEW COMPARISON:  Chest radiograph 05/24/2019 FINDINGS: Right-sided PICC with tip projecting in the region of the caudal SVC. Prior median sternotomy. Unchanged cardiomegaly with aortic valve prosthesis. Aortic atherosclerosis. Pulmonary edema demonstrated on chest radiograph 05/24/2019 has resolved. No evidence of airspace consolidation within the lungs. No evidence of pleural effusion or pneumothorax. No acute bony abnormality. IMPRESSION: No evidence of acute cardiopulmonary abnormality. Cardiomegaly with evidence of prior median sternotomy and aortic valve replacement. Aortic atherosclerosis. Electronically Signed   By: Kellie Simmering DO   On: 06/12/2019 12:25    Procedures Procedures (including critical care time)  Medications Ordered in ED Medications  aspirin EC tablet 81 mg (81 mg Oral Given 06/12/19 1535)  amLODipine (NORVASC) tablet 10 mg (10 mg Oral Given 06/12/19 1534)  isosorbide mononitrate (IMDUR) 24 hr tablet 30 mg (has no administration in time range)  nitroGLYCERIN (NITROSTAT) SL tablet 0.4 mg (has no administration in time range)  escitalopram (LEXAPRO) tablet 10 mg (10 mg Oral Given 06/12/19 1535)  levothyroxine (SYNTHROID) tablet 50 mcg (has no administration in time range)  clopidogrel (PLAVIX) tablet 75 mg (75 mg Oral Given 06/12/19 1534)  gabapentin (NEURONTIN) capsule 1,200 mg (1,200 mg Oral Given 06/12/19 1534)  potassium chloride SA (KLOR-CON) CR tablet 20 mEq (has no administration in time range)  loratadine (CLARITIN) tablet 10 mg (10 mg Oral Given 06/12/19 1534)  heparin injection 5,000 Units (has no administration in time range)  0.9 % NaCl with KCl 20 mEq/ L  infusion (has no administration in time range)  acetaminophen (TYLENOL) tablet 650 mg (has no administration in  time range)    Or  acetaminophen (TYLENOL) suppository 650 mg (has no administration in time range)  ondansetron (ZOFRAN) tablet 4 mg (has no administration in time range)    Or  ondansetron (ZOFRAN) injection 4 mg (has no administration in time range)  insulin aspart (novoLOG) injection 0-15 Units (has no administration in time range)  insulin aspart (novoLOG) injection 0-5 Units (has no administration in time range)  metoCLOPramide (REGLAN) tablet 10 mg (has no administration in time range)  piperacillin-tazobactam (ZOSYN) IVPB 3.375 g (3.375 g Intravenous New Bag/Given 06/12/19 1622)  sodium chloride 0.9 % bolus 500 mL (500 mLs Intravenous New Bag/Given 06/12/19 1253)  ondansetron (ZOFRAN) injection 4 mg (4 mg Intravenous Given 06/12/19 1255)  magnesium sulfate IVPB 2 g 50 mL (2 g Intravenous New Bag/Given 06/12/19 1257)  potassium chloride 10 mEq in 100 mL IVPB (10 mEq Intravenous New Bag/Given 06/12/19 1430)    ED Course  I have reviewed the triage vital signs and the nursing notes.  Pertinent labs & imaging results that were available during my care of the patient were reviewed by me and considered in my medical decision making (see chart for details).  Clinical Course as of Jun 11 1717  Tue Jun 12, 2019  1216 Differential includes C. difficile, sepsis, metabolic derangement, dehydration.  Was able to reach his wife she said it started at 3 AM he had explosive diarrhea and vomiting and then went back  to sleep.  She did find him to be somewhat confused today when she was try to get him in the car and he would not pick up his feet to get in the car.  He has been on IV antibiotics since discharge in early February.   [MB]  1217 She said he saw his foot doctor in [place] last week and thought the wounds were healing well and the visiting nurse who checked him yesterday said the wounds looked good.   [MB]  1217 Chest x-ray interpreted by me as no gross infiltrates.   [MB]  67 Discussed  with Dr. Manuella Ghazi [facility] hospital[facility] who will evaluate the patient for admission.  I think most likely this is C. Difficile.    [MB]    Clinical Course User Index [MB] Hayden Rasmussen, MD   MDM Rules/Calculators/A&P                       Final Clinical Impression(s) / ED Diagnoses Final diagnoses:  Nausea vomiting and diarrhea  Low blood magnesium  Low blood potassium  Lethargy    Rx / DC Orders ED Discharge Orders    None       Hayden Rasmussen, MD 06/12/19 1722

## 2019-06-13 DIAGNOSIS — B9561 Methicillin susceptible Staphylococcus aureus infection as the cause of diseases classified elsewhere: Secondary | ICD-10-CM

## 2019-06-13 DIAGNOSIS — I739 Peripheral vascular disease, unspecified: Secondary | ICD-10-CM

## 2019-06-13 DIAGNOSIS — E876 Hypokalemia: Secondary | ICD-10-CM | POA: Diagnosis present

## 2019-06-13 DIAGNOSIS — R7881 Bacteremia: Secondary | ICD-10-CM

## 2019-06-13 DIAGNOSIS — E039 Hypothyroidism, unspecified: Secondary | ICD-10-CM

## 2019-06-13 DIAGNOSIS — B952 Enterococcus as the cause of diseases classified elsewhere: Secondary | ICD-10-CM

## 2019-06-13 DIAGNOSIS — R112 Nausea with vomiting, unspecified: Secondary | ICD-10-CM

## 2019-06-13 LAB — COMPREHENSIVE METABOLIC PANEL
ALT: 14 U/L (ref 0–44)
AST: 19 U/L (ref 15–41)
Albumin: 3.2 g/dL — ABNORMAL LOW (ref 3.5–5.0)
Alkaline Phosphatase: 41 U/L (ref 38–126)
Anion gap: 10 (ref 5–15)
BUN: 19 mg/dL (ref 8–23)
CO2: 23 mmol/L (ref 22–32)
Calcium: 8.5 mg/dL — ABNORMAL LOW (ref 8.9–10.3)
Chloride: 101 mmol/L (ref 98–111)
Creatinine, Ser: 1.18 mg/dL (ref 0.61–1.24)
GFR calc Af Amer: >60 mL/min (ref >60)
GFR calc non Af Amer: >60 mL/min (ref >60)
Glucose, Bld: 145 mg/dL — ABNORMAL HIGH (ref 70–99)
Potassium: 3.2 mmol/L — ABNORMAL LOW (ref 3.5–5.1)
Sodium: 134 mmol/L — ABNORMAL LOW (ref 135–145)
Total Bilirubin: 0.8 mg/dL (ref 0.3–1.2)
Total Protein: 7 g/dL (ref 6.5–8.1)

## 2019-06-13 LAB — CBC
HCT: 36.4 % — ABNORMAL LOW (ref 39.0–52.0)
Hemoglobin: 11.8 g/dL — ABNORMAL LOW (ref 13.0–17.0)
MCH: 30.9 pg (ref 26.0–34.0)
MCHC: 32.4 g/dL (ref 30.0–36.0)
MCV: 95.3 fL (ref 80.0–100.0)
Platelets: 177 10*3/uL (ref 150–400)
RBC: 3.82 MIL/uL — ABNORMAL LOW (ref 4.22–5.81)
RDW: 13.8 % (ref 11.5–15.5)
WBC: 4 10*3/uL (ref 4.0–10.5)
nRBC: 0 % (ref 0.0–0.2)

## 2019-06-13 LAB — GLUCOSE, CAPILLARY
Glucose-Capillary: 150 mg/dL — ABNORMAL HIGH (ref 70–99)
Glucose-Capillary: 141 mg/dL — ABNORMAL HIGH (ref 70–99)
Glucose-Capillary: 211 mg/dL — ABNORMAL HIGH (ref 70–99)
Glucose-Capillary: 179 mg/dL — ABNORMAL HIGH (ref 70–99)
Glucose-Capillary: 168 mg/dL — ABNORMAL HIGH (ref 70–99)

## 2019-06-13 LAB — MAGNESIUM: Magnesium: 1.9 mg/dL (ref 1.7–2.4)

## 2019-06-13 MED ORDER — ALUM & MAG HYDROXIDE-SIMETH 200-200-20 MG/5ML PO SUSP
30.0000 mL | ORAL | Status: DC | PRN
  Administered 2019-06-13 – 2019-06-14 (×3): 30 mL via ORAL
  Filled 2019-06-13 (×4): qty 30

## 2019-06-13 MED ORDER — POTASSIUM CHLORIDE IN NACL 20-0.9 MEQ/L-% IV SOLN: INTRAVENOUS | Status: AC

## 2019-06-13 MED ORDER — CHLORHEXIDINE GLUCONATE CLOTH 2 % EX PADS
6.0000 | MEDICATED_PAD | Freq: Every day | CUTANEOUS | Status: DC
  Administered 2019-06-14 – 2019-06-15 (×2): 6 via TOPICAL

## 2019-06-13 MED ORDER — PROMETHAZINE HCL 25 MG/ML IJ SOLN
12.5000 mg | Freq: Four times a day (QID) | INTRAMUSCULAR | Status: DC | PRN
  Administered 2019-06-14: 12.5 mg via INTRAVENOUS
  Filled 2019-06-13 (×3): qty 1

## 2019-06-13 NOTE — Progress Notes (Signed)
PROGRESS NOTE    Larry Hart  IRJ:[id number] DOB: 07-17-1945 DOA: 06/12/2019 PCP: Rory Percy, MD    Brief Narrative:  74 year old male with a history of hypertension, diabetes, peripheral vascular disease, recent admission for MSSA and enterococcal bacteremia from diabetic foot wound, on chronic Zosyn until 3/8, admitted to the hospital with nausea, vomiting and diarrhea.  He was noted to be dehydrated and had electrolyte abnormalities.  Admitted for IV fluids and electrolyte replacement.  Stool studies underway.   Assessment & Plan:   Active Problems:   Hypertension   Diabetes mellitus ([place])   PAD (peripheral artery disease) ([facility])   Hypothyroid   Bacteremia due to methicillin susceptible Staphylococcus aureus (MSSA)   Enterococcal bacteremia   Diarrhea   Hypokalemia   1. Acute onset diarrhea/vomiting.  GI pathogen panel currently in process.  Stool for C. difficile was sent and was found to be antigen positive, but toxin PCR negative.  Could possibly be related to antibiotics.  Follow-up further stool studies.  Treat supportively for now. 2. Electrolyte abnormalities including hypomagnesemia and hypokalemia.  Related to GI losses.  He was also on chlorthalidone.  Continue to replace. 3. Recent MSSA and enterococcal bacteremia from infected diabetic foot ulcer.  Continuing Zosyn through 3/8 per ID recommendations. 4. Type 2 diabetes.  Stable.  Continue on sliding scale insulin. 5. Diabetic neuropathy.  Continue gabapentin 6. 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  Continue on aspirin Plavix. 7. Hypertension.  Continue on amlodipine.  Blood pressure currently stable. 8. Hypothyroidism.  Continue Synthroid. 9. Generalized weakness.  Physical therapy evaluation.  He was receiving home health physical therapy prior to admission which patient did not feel helpful.   DVT prophylaxis: Heparin Code Status: DNR Family Communication: Discussed with  wife at the bedside Disposition Plan: Discharge home once diarrhea is manageable   Consultants:     Procedures:     Antimicrobials:   Zosyn   Subjective: Reports continued diarrhea with approximately 12 bowel movements today.  He did have an episode of vomiting earlier today.  Objective: Vitals:   06/13/19 0451 06/13/19 0500 06/13/19 0820 06/13/19 1547  BP: 125/75   (!) 126/49  Pulse: 63   66  Resp: 16   16  Temp: 99.7 F (37.6 C)   98.5 F (36.9 C)  TempSrc: Oral   Oral  SpO2: 99%  96% 96%  Weight:  91.4 kg    Height:        Intake/Output Summary (Last 24 hours) at 06/13/2019 1854 Last data filed at 06/13/2019 1700 Gross per 24 hour  Intake 1734.53 ml  Output 1950 ml  Net -215.47 ml   Filed Weights   06/12/19 1126 06/13/19 0500  Weight: 90.7 kg 91.4 kg    Examination:  General exam: Appears calm and comfortable  Respiratory system: Clear to auscultation. Respiratory effort normal. Cardiovascular system: S1 & S2 heard, RRR. No JVD, murmurs, rubs, gallops or clicks. No pedal edema. Gastrointestinal system: Abdomen is nondistended, soft and nontender. No organomegaly or masses felt. Normal bowel sounds heard. Central nervous system: Alert and oriented. No focal neurological deficits. Extremities: Right transmetatarsal amputation Skin: No rashes, lesions or ulcers Psychiatry: Judgement and insight appear normal. Mood & affect appropriate.     Data Reviewed: I have personally reviewed following labs and imaging studies  CBC: Recent Labs  Lab 06/12/19 1152 06/12/19 1411 06/13/19 0412  WBC 8.4 7.4 4.0  NEUTROABS 7.9*  --   --   HGB 12.9*  12.6* 11.8*  HCT 39.7 38.0* 36.4*  MCV 93.6 93.1 95.3  PLT 210 185 151   Basic Metabolic Panel: Recent Labs  Lab 06/12/19 1152 06/12/19 1411 06/13/19 0412  NA 132*  --  134*  K 3.1*  --  3.2*  CL 96*  --  101  CO2 23  --  23  GLUCOSE 146*  --  145*  BUN 25*  --  19  CREATININE 1.36* 1.21 1.18  CALCIUM 9.2   --  8.5*  MG 1.5*  --  1.9   GFR: Estimated Creatinine Clearance: 63.2 mL/min (by C-G formula based on SCr of 1.18 mg/dL). Liver Function Tests: Recent Labs  Lab 06/12/19 1152 06/13/19 0412  AST 18 19  ALT 15 14  ALKPHOS 41 41  BILITOT 1.0 0.8  PROT 7.9 7.0  ALBUMIN 3.7 3.2*   Recent Labs  Lab 06/12/19 1152  LIPASE 18   No results for input(s): AMMONIA in the last 168 hours. Coagulation Profile: Recent Labs  Lab 06/12/19 1152  INR 1.1   Cardiac Enzymes: No results for input(s): CKTOTAL, CKMB, CKMBINDEX, TROPONINI in the last 168 hours. BNP (last 3 results) No results for input(s): PROBNP in the last 8760 hours. HbA1C: No results for input(s): HGBA1C in the last 72 hours. CBG: Recent Labs  Lab 06/12/19 2326 06/13/19 0437 06/13/19 0748 06/13/19 1120 06/13/19 1631  GLUCAP 162* 150* 141* 211* 179*   Lipid Profile: No results for input(s): CHOL, HDL, LDLCALC, TRIG, CHOLHDL, LDLDIRECT in the last 72 hours. Thyroid Function Tests: No results for input(s): TSH, T4TOTAL, FREET4, T3FREE, THYROIDAB in the last 72 hours. Anemia Panel: No results for input(s): VITAMINB12, FOLATE, FERRITIN, TIBC, IRON, RETICCTPCT in the last 72 hours. Sepsis Labs: No results for input(s): PROCALCITON, LATICACIDVEN in the last 168 hours.  Recent Results (from the past 240 hour(s))  Culture, blood (routine x 2)     Status: None (Preliminary result)   Collection Time: 06/12/19 11:52 AM   Specimen: BLOOD  Result Value Ref Range Status   Specimen Description BLOOD  Final   Special Requests NONE  Final   Culture   Final    NO GROWTH < 24 HOURS Performed at [facility], [address] [place], [place] [address]    Report Status PENDING  Incomplete  Culture, blood (routine x 2)     Status: None (Preliminary result)   Collection Time: 06/12/19 11:52 AM   Specimen: BLOOD  Result Value Ref Range Status   Specimen Description BLOOD  Final   Special Requests NONE  Final   Culture   Final     NO GROWTH < 24 HOURS Performed at [facility], [address], [place], [place] [address]    Report Status PENDING  Incomplete  SARS CORONAVIRUS 2 (TAT 6-24 HRS) Nasopharyngeal Nasopharyngeal Swab     Status: None   Collection Time: 06/12/19 12:17 PM   Specimen: Nasopharyngeal Swab  Result Value Ref Range Status   SARS Coronavirus 2 NEGATIVE NEGATIVE Final    Comment: (NOTE) SARS-CoV-2 target nucleic acids are NOT DETECTED. The SARS-CoV-2 RNA is generally detectable in upper and lower respiratory specimens during the acute phase of infection. Negative results do not preclude SARS-CoV-2 infection, do not rule out co-infections with other pathogens, and should not be used as the sole basis for treatment or other patient management decisions. Negative results must be combined with clinical observations, patient history, and epidemiological information. The expected result is Negative. Fact Sheet for Patients:  SugarRoll.be Fact Sheet for Healthcare Providers: https://www.woods-mathews.com/ This test is not yet approved or cleared by the [place] FDA and  has been authorized for detection and/or diagnosis of SARS-CoV-2 by FDA under an Emergency Use Authorization (EUA). This EUA will remain  in effect (meaning this test can be used) for the duration of the COVID-19 declaration under Section 56 4(b)(1) of the Act, 21 U.S.C. section 360bbb-3(b)(1), unless the authorization is terminated or revoked sooner. Performed at [facility], [place] [address], [place], [place] [address]   C Difficile Quick Screen w PCR reflex     Status: Abnormal   Collection Time: 06/12/19  3:08 PM   Specimen: STOOL  Result Value Ref Range Status   C Diff antigen POSITIVE (A) NEGATIVE Final   C Diff toxin NEGATIVE NEGATIVE Final   C Diff interpretation Results are indeterminate. See PCR results.  Final    Comment: Performed at [facility], [address] [place], [place] [address]  C. Diff by PCR, Reflexed     Status: None   Collection Time: 06/12/19  3:08 PM  Result Value Ref Range Status   Toxigenic C. Difficile by PCR NEGATIVE NEGATIVE Final    Comment: Patient is colonized with non toxigenic C. difficile. May not need treatment unless significant symptoms are present. Performed at [facility], [place] [address] [place], [place] [address]          Radiology Studies: DG Chest Port 1 View  Result Date: 06/12/2019 CLINICAL DATA:  Weakness. Additional history provided by technologist: Vomiting and diarrhea since last night. EXAM: PORTABLE CHEST 1 VIEW COMPARISON:  Chest radiograph 05/24/2019 FINDINGS: Right-sided PICC with tip projecting in the region of the caudal SVC. Prior median sternotomy. Unchanged cardiomegaly with aortic valve prosthesis. Aortic atherosclerosis. Pulmonary edema demonstrated on chest radiograph 05/24/2019 has resolved. No evidence of airspace consolidation within the lungs. No evidence of pleural effusion or pneumothorax. No acute bony abnormality. IMPRESSION: No evidence of acute cardiopulmonary abnormality. Cardiomegaly with evidence of prior median sternotomy and aortic valve replacement. Aortic atherosclerosis. Electronically Signed   By: Kellie Simmering DO   On: 06/12/2019 12:25        Scheduled Meds: . amLODipine  10 mg Oral Daily  . aspirin EC  81 mg Oral Daily  . Chlorhexidine Gluconate Cloth  6 each Topical Daily  . clopidogrel  75 mg Oral Daily  . escitalopram  10 mg Oral Daily  . gabapentin  1,200 mg Oral TID  . heparin  5,000 Units Subcutaneous Q8H  . insulin aspart  0-15 Units Subcutaneous Q4H  . insulin aspart  0-5 Units Subcutaneous QHS  . isosorbide mononitrate  30 mg Oral Daily  . levothyroxine  50 mcg Oral QAC breakfast  . loratadine  10 mg Oral Daily  . metoCLOPramide  10 mg Oral TID AC  . potassium chloride SA  20 mEq Oral BID   Continuous Infusions: . 0.9 % NaCl with KCl 20 mEq / L     . piperacillin-tazobactam (ZOSYN)  IV 3.375 g (06/13/19 1615)     LOS: 1 day    Time spent: 35mins    Kathie Dike, MD [facility]   If 7PM-7AM, please contact night-coverage [email or web address]  06/13/2019, 6:54 PM

## 2019-06-13 NOTE — Plan of Care (Signed)
  Problem: Education: Goal: Knowledge of General Education information will improve Description: Including pain rating scale, medication(s)/side effects and non-pharmacologic comfort measures Outcome: Progressing   Problem: Clinical Measurements: Goal: Will remain free from infection Outcome: Progressing   Problem: Activity: Goal: Risk for activity intolerance will decrease Outcome: Progressing   Problem: Nutrition: Goal: Adequate nutrition will be maintained Outcome: Progressing   Problem: Coping: Goal: Level of anxiety will decrease Outcome: Progressing   Problem: Elimination: Goal: Will not experience complications related to urinary retention Outcome: Progressing   Problem: Safety: Goal: Ability to remain free from injury will improve Outcome: Progressing

## 2019-06-14 LAB — GLUCOSE, CAPILLARY
Glucose-Capillary: 145 mg/dL — ABNORMAL HIGH (ref 70–99)
Glucose-Capillary: 153 mg/dL — ABNORMAL HIGH (ref 70–99)
Glucose-Capillary: 158 mg/dL — ABNORMAL HIGH (ref 70–99)
Glucose-Capillary: 219 mg/dL — ABNORMAL HIGH (ref 70–99)
Glucose-Capillary: 263 mg/dL — ABNORMAL HIGH (ref 70–99)
Glucose-Capillary: 271 mg/dL — ABNORMAL HIGH (ref 70–99)

## 2019-06-14 LAB — BASIC METABOLIC PANEL
Anion gap: 10 (ref 5–15)
BUN: 13 mg/dL (ref 8–23)
CO2: 24 mmol/L (ref 22–32)
Calcium: 8.6 mg/dL — ABNORMAL LOW (ref 8.9–10.3)
Chloride: 103 mmol/L (ref 98–111)
Creatinine, Ser: 1.06 mg/dL (ref 0.61–1.24)
GFR calc Af Amer: >60 mL/min (ref >60)
GFR calc non Af Amer: >60 mL/min (ref >60)
Glucose, Bld: 152 mg/dL — ABNORMAL HIGH (ref 70–99)
Potassium: 3.4 mmol/L — ABNORMAL LOW (ref 3.5–5.1)
Sodium: 137 mmol/L (ref 135–145)

## 2019-06-14 MED ORDER — POTASSIUM CHLORIDE CRYS ER 20 MEQ PO TBCR
40.0000 meq | EXTENDED_RELEASE_TABLET | Freq: Once | ORAL | Status: AC
  Administered 2019-06-14: 40 meq via ORAL
  Filled 2019-06-14: qty 2

## 2019-06-14 MED ORDER — PSYLLIUM 95 % PO PACK
1.0000 | PACK | Freq: Two times a day (BID) | ORAL | Status: DC
  Administered 2019-06-14 – 2019-06-15 (×2): 1 via ORAL
  Filled 2019-06-14 (×2): qty 1

## 2019-06-14 NOTE — Progress Notes (Signed)
PROGRESS NOTE    Larry Hart  XHB:[id number] DOB: 15-Oct-1945 DOA: 06/12/2019 PCP: Rory Percy, MD    Brief Narrative:  74 year old male with a history of hypertension, diabetes, peripheral vascular disease, recent admission for MSSA and enterococcal bacteremia from diabetic foot wound, on chronic Zosyn until 3/8, admitted to the hospital with nausea, vomiting and diarrhea.  He was noted to be dehydrated and had electrolyte abnormalities.  Admitted for IV fluids and electrolyte replacement.  Stool studies underway.   Assessment & Plan:   Active Problems:   Hypertension   Diabetes mellitus ([place])   PAD (peripheral artery disease) (HCC)   Hypothyroid   Bacteremia due to methicillin susceptible Staphylococcus aureus (MSSA)   Enterococcal bacteremia   Diarrhea   Hypokalemia   1. Acute onset diarrhea/vomiting.  GI pathogen panel currently in process.  Stool for C. difficile was sent and was found to be antigen positive, but toxin PCR negative.  Discussed with Dr. Baxter Flattery and patient likely colonized with nontoxigenic C. difficile.  Since patient does not have any fever, leukocytosis, clinically bowel movements have improved since yesterday without specific therapy.  Unlikely to be active C. difficile.  Since his wife is having similar symptoms, this would suggest an underlying infectious process.  Would continue to treat supportively for now. 2. Electrolyte abnormalities including hypomagnesemia and hypokalemia.  Related to GI losses.  He was also on chlorthalidone.  Continue to replace. 3. Recent MSSA and enterococcal bacteremia from infected diabetic foot ulcer.  Continuing Zosyn through 3/8 per ID recommendations. 4. Type 2 diabetes.  Stable.  Continue on sliding scale insulin. 5. Diabetic neuropathy.  Continue gabapentin 6. 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  Continue on aspirin Plavix. 7. Hypertension.  Continue on amlodipine.  Blood pressure  currently stable. 8. Hypothyroidism.  Continue Synthroid. 9. Generalized weakness.  Seen by physical therapy with recommendations for home health PT.   DVT prophylaxis: Heparin Code Status: DNR Family Communication: Discussed with wife on 3/3 Disposition Plan: Discharge home once diarrhea is manageable   Consultants:     Procedures:     Antimicrobials:   Zosyn   Subjective: Had an episode of vomiting this morning prior to eating breakfast.  Staff reports 3-4 bowel movements during the day.  Reports that his wife is developing similar symptoms.  Objective: Vitals:   06/13/19 2021 06/14/19 0500 06/14/19 0551 06/14/19 0800  BP: (!) 145/69  (!) 159/68   Pulse: (!) 58  73   Resp: 20  18   Temp: 97.8 F (36.6 C)  99.1 F (37.3 C)   TempSrc: Oral  Oral   SpO2: 96%  98% 96%  Weight:  92.7 kg    Height:        Intake/Output Summary (Last 24 hours) at 06/14/2019 1835 Last data filed at 06/14/2019 1700 Gross per 24 hour  Intake 1992.41 ml  Output 2900 ml  Net -907.59 ml   Filed Weights   06/12/19 1126 06/13/19 0500 06/14/19 0500  Weight: 90.7 kg 91.4 kg 92.7 kg    Examination:  General exam: Alert, awake, oriented x 3 Respiratory system: Clear to auscultation. Respiratory effort normal. Cardiovascular system:RRR. No murmurs, rubs, gallops. Gastrointestinal system: Abdomen is nondistended, soft and nontender. No organomegaly or masses felt. Normal bowel sounds heard. Central nervous system: Alert and oriented. No focal neurological deficits. Extremities: Right transmetatarsal amputation Skin: No rashes, lesions or ulcers Psychiatry: Judgement and insight appear normal. Mood & affect appropriate.     Data  Reviewed: I have personally reviewed following labs and imaging studies  CBC: Recent Labs  Lab 06/12/19 1152 06/12/19 1411 06/13/19 0412  WBC 8.4 7.4 4.0  NEUTROABS 7.9*  --   --   HGB 12.9* 12.6* 11.8*  HCT 39.7 38.0* 36.4*  MCV 93.6 93.1 95.3  PLT 210  185 102   Basic Metabolic Panel: Recent Labs  Lab 06/12/19 1152 06/12/19 1411 06/13/19 0412 06/14/19 0345  NA 132*  --  134* 137  K 3.1*  --  3.2* 3.4*  CL 96*  --  101 103  CO2 23  --  23 24  GLUCOSE 146*  --  145* 152*  BUN 25*  --  19 13  CREATININE 1.36* 1.21 1.18 1.06  CALCIUM 9.2  --  8.5* 8.6*  MG 1.5*  --  1.9  --    GFR: Estimated Creatinine Clearance: 70.8 mL/min (by C-G formula based on SCr of 1.06 mg/dL). Liver Function Tests: Recent Labs  Lab 06/12/19 1152 06/13/19 0412  AST 18 19  ALT 15 14  ALKPHOS 41 41  BILITOT 1.0 0.8  PROT 7.9 7.0  ALBUMIN 3.7 3.2*   Recent Labs  Lab 06/12/19 1152  LIPASE 18   No results for input(s): AMMONIA in the last 168 hours. Coagulation Profile: Recent Labs  Lab 06/12/19 1152  INR 1.1   Cardiac Enzymes: No results for input(s): CKTOTAL, CKMB, CKMBINDEX, TROPONINI in the last 168 hours. BNP (last 3 results) No results for input(s): PROBNP in the last 8760 hours. HbA1C: No results for input(s): HGBA1C in the last 72 hours. CBG: Recent Labs  Lab 06/14/19 0044 06/14/19 0337 06/14/19 0708 06/14/19 1058 06/14/19 1548  GLUCAP 158* 153* 145* 219* 263*   Lipid Profile: No results for input(s): CHOL, HDL, LDLCALC, TRIG, CHOLHDL, LDLDIRECT in the last 72 hours. Thyroid Function Tests: No results for input(s): TSH, T4TOTAL, FREET4, T3FREE, THYROIDAB in the last 72 hours. Anemia Panel: No results for input(s): VITAMINB12, FOLATE, FERRITIN, TIBC, IRON, RETICCTPCT in the last 72 hours. Sepsis Labs: No results for input(s): PROCALCITON, LATICACIDVEN in the last 168 hours.  Recent Results (from the past 240 hour(s))  Culture, blood (routine x 2)     Status: None (Preliminary result)   Collection Time: 06/12/19 11:52 AM   Specimen: BLOOD  Result Value Ref Range Status   Specimen Description BLOOD  Final   Special Requests NONE  Final   Culture   Final    NO GROWTH 2 DAYS Performed at [facility], [address] [place], [place] [address]    Report Status PENDING  Incomplete  Culture, blood (routine x 2)     Status: None (Preliminary result)   Collection Time: 06/12/19 11:52 AM   Specimen: BLOOD  Result Value Ref Range Status   Specimen Description BLOOD  Final   Special Requests NONE  Final   Culture   Final    NO GROWTH 2 DAYS Performed at [facility], [address] [place], [place] [address]    Report Status PENDING  Incomplete  SARS CORONAVIRUS 2 (TAT 6-24 HRS) Nasopharyngeal Nasopharyngeal Swab     Status: None   Collection Time: 06/12/19 12:17 PM   Specimen: Nasopharyngeal Swab  Result Value Ref Range Status   SARS Coronavirus 2 NEGATIVE NEGATIVE Final    Comment: (NOTE) SARS-CoV-2 target nucleic acids are NOT DETECTED. The SARS-CoV-2 RNA is generally detectable in upper and lower respiratory specimens during the acute phase of infection. Negative results do  not preclude SARS-CoV-2 infection, do not rule out co-infections with other pathogens, and should not be used as the sole basis for treatment or other patient management decisions. Negative results must be combined with clinical observations, patient history, and epidemiological information. The expected result is Negative. Fact Sheet for Patients: SugarRoll.be Fact Sheet for Healthcare Providers: https://www.woods-mathews.com/ This test is not yet approved or cleared by the [place] FDA and  has been authorized for detection and/or diagnosis of SARS-CoV-2 by FDA under an Emergency Use Authorization (EUA). This EUA will remain  in effect (meaning this test can be used) for the duration of the COVID-19 declaration under Section 56 4(b)(1) of the Act, 21 U.S.C. section 360bbb-3(b)(1), unless the authorization is terminated or revoked sooner. Performed at [facility], [place] [address] [place], [place] [address]   C Difficile Quick Screen w PCR reflex     Status: Abnormal    Collection Time: 06/12/19  3:08 PM   Specimen: STOOL  Result Value Ref Range Status   C Diff antigen POSITIVE (A) NEGATIVE Final   C Diff toxin NEGATIVE NEGATIVE Final   C Diff interpretation Results are indeterminate. See PCR results.  Final    Comment: Performed at [facility], [address], [place], [place] [address]  C. Diff by PCR, Reflexed     Status: None   Collection Time: 06/12/19  3:08 PM  Result Value Ref Range Status   Toxigenic C. Difficile by PCR NEGATIVE NEGATIVE Final    Comment: Patient is colonized with non toxigenic C. difficile. May not need treatment unless significant symptoms are present. Performed at [facility], [place] [address] [place],  [address]          Radiology Studies: No results found.      Scheduled Meds: . amLODipine  10 mg Oral Daily  . aspirin EC  81 mg Oral Daily  . Chlorhexidine Gluconate Cloth  6 each Topical Daily  . clopidogrel  75 mg Oral Daily  . escitalopram  10 mg Oral Daily  . gabapentin  1,200 mg Oral TID  . heparin  5,000 Units Subcutaneous Q8H  . insulin aspart  0-15 Units Subcutaneous Q4H  . insulin aspart  0-5 Units Subcutaneous QHS  . isosorbide mononitrate  30 mg Oral Daily  . levothyroxine  50 mcg Oral QAC breakfast  . loratadine  10 mg Oral Daily  . metoCLOPramide  10 mg Oral TID AC  . potassium chloride SA  20 mEq Oral BID  . psyllium  1 packet Oral BID   Continuous Infusions: . piperacillin-tazobactam (ZOSYN)  IV 3.375 g (06/14/19 1655)     LOS: 2 days    Time spent: 91mins    Kathie Dike, MD [facility]   If 7PM-7AM, please contact night-coverage [email or web address]  06/14/2019, 6:35 PM

## 2019-06-14 NOTE — Progress Notes (Signed)
Has had 3-4 loose stools today.  Ate about 50% of supper and denies nausea.

## 2019-06-14 NOTE — TOC Initial Note (Signed)
Transition of Care [facility]) - Initial/Assessment Note    Patient Details  Name: Larry Hart MRN: [id number] Date of Birth: [birth year]  Transition of Care [facility]) CM/SW Contact:    Boneta Lucks, RN Phone Number: 06/14/2019, 2:24 PM  Clinical Narrative:    Patient admitted with Diarrhea. Patient lives at home with his wife. PT is recommending HHPT.  Patient is active with [facility] for PT and RN. Patient has right foot wound.  Updated [place]. They need resumption orders. TOC to follow.            Expected Discharge Plan: [place] Barriers to Discharge: Continued Medical Work up   Patient Goals and CMS Choice Patient states their goals for this hospitalization and ongoing recovery are:: to return home. CMS Medicare.gov Compare Post Acute Care list provided to:: Patient Choice offered to / list presented to : Patient  Expected Discharge Plan and Services Expected Discharge Plan: [place]       Living arrangements for the past 2 months: Single Family Home                   Prior Living Arrangements/Services Living arrangements for the past 2 months: Single Family Home Lives with:: Spouse          Need for Family Participation in Patient Care: Yes (Comment) Care giver support system in place?: Yes (comment)   Criminal Activity/Legal Involvement Pertinent to Current Situation/Hospitalization: No - Comment as needed  Activities of Daily Living Home Assistive Devices/Equipment: CBG Meter, Walker (specify type) ADL Screening (condition at time of admission) Patient's cognitive ability adequate to safely complete daily activities?: Yes Is the patient deaf or have difficulty hearing?: No Does the patient have difficulty seeing, even when wearing glasses/contacts?: No Does the patient have difficulty concentrating, remembering, or making decisions?: Yes Patient able to express need for assistance with ADLs?: Yes Does the  patient have difficulty dressing or bathing?: No Independently performs ADLs?: No Communication: Independent Dressing (OT): Needs assistance Is this a change from baseline?: Pre-admission baseline Grooming: Independent Feeding: Independent Bathing: Needs assistance Is this a change from baseline?: Pre-admission baseline Toileting: Needs assistance Is this a change from baseline?: Pre-admission baseline In/Out Bed: Needs assistance Is this a change from baseline?: Pre-admission baseline Walks in Home: Independent with device (comment) Does the patient have difficulty walking or climbing stairs?: No Weakness of Legs: Both Weakness of Arms/Hands: None  Permission Sought/Granted        Emotional Assessment   Admission diagnosis:  Diarrhea [R19.7] Patient Active Problem List   Diagnosis Date Noted  . Hypokalemia 06/13/2019  . Diarrhea 06/12/2019  . Sepsis ([place]) 05/23/2019  . DKA (diabetic ketoacidoses) ([place]) 05/23/2019  . CKD (chronic kidney disease) stage 3, GFR 30-59 ml/min 05/23/2019  . CAD (coronary artery disease) 05/23/2019  . S/P CABG (coronary artery bypass graft) 05/23/2019  . PAD (peripheral artery disease) ([place]) 05/23/2019  . Dyslipidemia 05/23/2019  . Hypothyroid 05/23/2019  . 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 S/P transmetatarsal amputation of foot, right ([place]) 05/23/2019  . Diabetic foot ulcer ([place]) 05/23/2019  . Normocytic anemia 05/23/2019  . Thrombocytopenia ([place]) 05/23/2019  . Bacteremia due to methicillin susceptible Staphylococcus aureus (MSSA) 05/23/2019  . Enterococcal bacteremia 05/23/2019  . Hypertension   . Diabetes mellitus ([place])    PCP:  Rory Percy, MD Pharmacy:   [place], [place]  [place] [address] Phone: [phone] Fax: [phone]

## 2019-06-14 NOTE — Progress Notes (Addendum)
Vomited this morning before meds or breakfast.  Gave phenergan and maalox. Contacted Dr. Roderic Palau.  Has had no more vomiting and did eat some french toast for breakfast.

## 2019-06-14 NOTE — Plan of Care (Signed)
  Problem: Acute Rehab PT Goals(only PT should resolve) Goal: Pt Will Go Supine/Side To Sit Outcome: Progressing Flowsheets (Taken 06/14/2019 0939) Pt will go Supine/Side to Sit: with modified independence Goal: Patient Will Transfer Sit To/From Stand Outcome: Progressing Flowsheets (Taken 06/14/2019 0939) Patient will transfer sit to/from stand: with supervision Goal: Pt Will Transfer Bed To Chair/Chair To Bed Outcome: Progressing Flowsheets (Taken 06/14/2019 0939) Pt will Transfer Bed to Chair/Chair to Bed: with supervision Goal: Pt Will Ambulate Outcome: Progressing Flowsheets (Taken 06/14/2019 0939) Pt will Ambulate:  75 feet  with supervision  with rolling walker   9:39 AM, 06/14/19 Lonell Grandchild, MPT Physical Therapist with [facility] [phone] office [phone] mobile phone

## 2019-06-14 NOTE — Evaluation (Signed)
Physical Therapy Evaluation Patient Details Name: Larry Hart MRN: [id number] DOB: 18-Nov-1945 Today's Date: 06/14/2019   History of Present Illness  Larry Hart is a 74 y.o. male with medical history significant for recent MSSA and enterococcal bacteremia related to diabetic wound infection-currently on Zosyn, type 2 diabetes, hypertension, dyslipidemia, peripheral vascular disease status post right transmetatarsal foot amputation, hypothyroidism, CKD stage III, and prior TAVR with bioprosthetic aortic valve who was brought to the ED with explosive diarrhea along with some nausea and vomiting that began early this morning around 3 AM.  Wife at bedside is the primary historian as patient appears somewhat lethargic.  She states that he had an explosive bowel movement around 3 AM during his sleep and she helped him clean up after which point in time he fell back asleep.  He had another 2 or 3 bowel movements that were liquidy along with nausea and vomiting for couple episodes.  He cannot tolerate anything by mouth and therefore brought him to the ED for further evaluation.  She states she checked his temperature at home and it was 97.8 Fahrenheit.  No chills, abdominal pain, chest pain, shortness of breath, or other concerns noted.  He has been taking his home IV Zosyn as prescribed on recent discharge and has even seen wound care with improvement in his infection noted overall.  His wife is also concerned about the patient becoming more weak lately and has not been able to ambulate very well despite home health physical therapy seeing him.    Clinical Impression  Patient functioning near baseline for functional mobility and gait, slightly unsteady cadence without loss of balance, limited mostly due to fatigue and nausea with episodes of vomiting prior to visit and patient tolerated sitting up in chair after therapy - RN notified.  Patient will benefit from continued physical therapy in hospital and  recommended venue below to increase strength, balance, endurance for safe ADLs and gait.     Follow Up Recommendations Home health PT;Supervision for mobility/OOB;Supervision - Intermittent    Equipment Recommendations  None recommended by PT    Recommendations for Other Services       Precautions / Restrictions Precautions Precautions: Fall Restrictions Weight Bearing Restrictions: No      Mobility  Bed Mobility Overal bed mobility: Needs Assistance Bed Mobility: Rolling;Sidelying to Sit Rolling: Supervision Sidelying to sit: Supervision;HOB elevated       General bed mobility comments: slightly increased time, labored movement  Transfers Overall transfer level: Needs assistance Equipment used: Rolling walker (2 wheeled) Transfers: Sit to/from [facility] Sit to Stand: Min guard Stand pivot transfers: Min guard          Ambulation/Gait Ambulation/Gait assistance: Min guard Gait Distance (Feet): 55 Feet Assistive device: Rolling walker (2 wheeled) Gait Pattern/deviations: Decreased step length - right;Decreased step length - left;Decreased stride length Gait velocity: decreased   General Gait Details: attempted a few steps without AD, very unsteady and patient requested to use RW, demonstrated slightly labored cadence without loss of balance, limited secondary to fatigue  Stairs            Wheelchair Mobility    Modified Rankin (Stroke Patients Only)       Balance Overall balance assessment: Needs assistance Sitting-balance support: Feet supported;No upper extremity supported Sitting balance-Leahy Scale: Fair Sitting balance - Comments: fair/good seated at EOB   Standing balance support: Bilateral upper extremity supported;During functional activity Standing balance-Leahy Scale: Fair Standing balance comment: with use of RW  Pertinent Vitals/Pain Pain Assessment: 0-10 Pain Score: 6   Pain Location: chronic bilateral foot pain secondary to neuropathy "per patient" Pain Descriptors / Indicators: Aching;Discomfort Pain Intervention(s): Limited activity within patient's tolerance;Monitored during session    [place] expects to be discharged to:: Private residence Living Arrangements: Spouse/significant other Available Help at Discharge: Family;Available 24 hours/day Type of Home: House Home Access: Stairs to enter Entrance Stairs-Rails: None Entrance Stairs-Number of Steps: 1-2 Home Layout: Two level;Laundry or work area in basement;Able to live on main level with bedroom/bathroom Home Equipment: Environmental consultant - 2 wheels;Bedside commode;Grab bars - tub/shower;Wheelchair - Press photographer;Shower seat;Cane - single point;Other (comment)      Prior Function Level of Independence: Needs assistance   Gait / Transfers Assistance Needed: household and short distanced community ambulator, drives  ADL's / Homemaking Assistance Needed: wife assists        Hand Dominance        Extremity/Trunk Assessment   Upper Extremity Assessment Upper Extremity Assessment: Generalized weakness    Lower Extremity Assessment Lower Extremity Assessment: Generalized weakness    Cervical / Trunk Assessment Cervical / Trunk Assessment: Kyphotic  Communication   Communication: No difficulties  Cognition Arousal/Alertness: Awake/alert Behavior During Therapy: WFL for tasks assessed/performed Overall Cognitive Status: Within Functional Limits for tasks assessed                                        General Comments      Exercises     Assessment/Plan    PT Assessment Patient needs continued PT services  PT Problem List Decreased strength;Decreased mobility;Decreased activity tolerance;Decreased balance       PT Treatment Interventions Gait training;Balance training;Stair training;Functional mobility training;Therapeutic  activities;Therapeutic exercise;Patient/family education    PT Goals (Current goals can be found in the Care Plan section)  Acute Rehab PT Goals Patient Stated Goal: return home with wife to assist PT Goal Formulation: With patient Time For Goal Achievement: 06/21/19 Potential to Achieve Goals: Good    Frequency Min 3X/week   Barriers to discharge        Co-evaluation               AM-PAC PT "6 Clicks" Mobility  Outcome Measure Help needed turning from your back to your side while in a flat bed without using bedrails?: None Help needed moving from lying on your back to sitting on the side of a flat bed without using bedrails?: A Little Help needed moving to and from a bed to a chair (including a wheelchair)?: A Little Help needed standing up from a chair using your arms (e.g., wheelchair or bedside chair)?: A Little Help needed to walk in hospital room?: A Little Help needed climbing 3-5 steps with a railing? : A Lot 6 Click Score: 18    End of Session   Activity Tolerance: Patient tolerated treatment well;Patient limited by fatigue Patient left: in chair;with call bell/phone within reach Nurse Communication: Mobility status PT Visit Diagnosis: Unsteadiness on feet (R26.81);Other abnormalities of gait and mobility (R26.89);Muscle weakness (generalized) (M62.81)    Time: 2992-4268 PT Time Calculation (min) (ACUTE ONLY): 24 min   Charges:   PT Evaluation $PT Eval Moderate Complexity: 1 Mod PT Treatments $Therapeutic Activity: 23-37 mins        9:36 AM, 06/14/19 Lonell Grandchild, MPT Physical Therapist with [facility] [phone] office [phone] mobile phone

## 2019-06-15 DIAGNOSIS — E039 Hypothyroidism, unspecified: Secondary | ICD-10-CM | POA: Diagnosis not present

## 2019-06-15 DIAGNOSIS — I10 Essential (primary) hypertension: Secondary | ICD-10-CM | POA: Diagnosis not present

## 2019-06-15 DIAGNOSIS — J309 Allergic rhinitis, unspecified: Secondary | ICD-10-CM | POA: Diagnosis not present

## 2019-06-15 DIAGNOSIS — E785 Hyperlipidemia, unspecified: Secondary | ICD-10-CM | POA: Diagnosis not present

## 2019-06-15 DIAGNOSIS — R411 Anterograde amnesia: Secondary | ICD-10-CM | POA: Diagnosis not present

## 2019-06-15 DIAGNOSIS — R7881 Bacteremia: Secondary | ICD-10-CM | POA: Diagnosis not present

## 2019-06-15 DIAGNOSIS — E1165 Type 2 diabetes mellitus with hyperglycemia: Secondary | ICD-10-CM | POA: Diagnosis not present

## 2019-06-15 DIAGNOSIS — E1110 Type 2 diabetes mellitus with ketoacidosis without coma: Secondary | ICD-10-CM | POA: Diagnosis not present

## 2019-06-15 DIAGNOSIS — R262 Difficulty in walking, not elsewhere classified: Secondary | ICD-10-CM | POA: Diagnosis not present

## 2019-06-15 DIAGNOSIS — E119 Type 2 diabetes mellitus without complications: Secondary | ICD-10-CM | POA: Diagnosis not present

## 2019-06-15 DIAGNOSIS — A0811 Acute gastroenteropathy due to Norwalk agent: Secondary | ICD-10-CM | POA: Diagnosis not present

## 2019-06-15 DIAGNOSIS — B952 Enterococcus as the cause of diseases classified elsewhere: Secondary | ICD-10-CM | POA: Diagnosis not present

## 2019-06-15 DIAGNOSIS — I739 Peripheral vascular disease, unspecified: Secondary | ICD-10-CM | POA: Diagnosis not present

## 2019-06-15 DIAGNOSIS — E0840 Diabetes mellitus due to underlying condition with diabetic neuropathy, unspecified: Secondary | ICD-10-CM | POA: Diagnosis not present

## 2019-06-15 DIAGNOSIS — F419 Anxiety disorder, unspecified: Secondary | ICD-10-CM | POA: Diagnosis not present

## 2019-06-15 DIAGNOSIS — N179 Acute kidney failure, unspecified: Secondary | ICD-10-CM | POA: Diagnosis not present

## 2019-06-15 DIAGNOSIS — J811 Chronic pulmonary edema: Secondary | ICD-10-CM | POA: Diagnosis not present

## 2019-06-15 DIAGNOSIS — N183 Chronic kidney disease, stage 3 unspecified: Secondary | ICD-10-CM | POA: Diagnosis not present

## 2019-06-15 DIAGNOSIS — B9561 Methicillin susceptible Staphylococcus aureus infection as the cause of diseases classified elsewhere: Secondary | ICD-10-CM | POA: Diagnosis not present

## 2019-06-15 DIAGNOSIS — M6281 Muscle weakness (generalized): Secondary | ICD-10-CM | POA: Diagnosis not present

## 2019-06-15 DIAGNOSIS — R197 Diarrhea, unspecified: Secondary | ICD-10-CM | POA: Diagnosis not present

## 2019-06-15 DIAGNOSIS — K219 Gastro-esophageal reflux disease without esophagitis: Secondary | ICD-10-CM | POA: Diagnosis not present

## 2019-06-15 DIAGNOSIS — E118 Type 2 diabetes mellitus with unspecified complications: Secondary | ICD-10-CM | POA: Diagnosis not present

## 2019-06-15 DIAGNOSIS — E876 Hypokalemia: Secondary | ICD-10-CM | POA: Diagnosis not present

## 2019-06-15 DIAGNOSIS — Z7401 Bed confinement status: Secondary | ICD-10-CM | POA: Diagnosis not present

## 2019-06-15 DIAGNOSIS — E11621 Type 2 diabetes mellitus with foot ulcer: Secondary | ICD-10-CM | POA: Diagnosis not present

## 2019-06-15 DIAGNOSIS — A419 Sepsis, unspecified organism: Secondary | ICD-10-CM | POA: Diagnosis not present

## 2019-06-15 DIAGNOSIS — E1122 Type 2 diabetes mellitus with diabetic chronic kidney disease: Secondary | ICD-10-CM | POA: Diagnosis not present

## 2019-06-15 DIAGNOSIS — Z89421 Acquired absence of other right toe(s): Secondary | ICD-10-CM | POA: Diagnosis not present

## 2019-06-15 DIAGNOSIS — L97519 Non-pressure chronic ulcer of other part of right foot with unspecified severity: Secondary | ICD-10-CM | POA: Diagnosis not present

## 2019-06-15 DIAGNOSIS — Z89431 Acquired absence of right foot: Secondary | ICD-10-CM | POA: Diagnosis not present

## 2019-06-15 LAB — BASIC METABOLIC PANEL
Anion gap: 11 (ref 5–15)
BUN: 13 mg/dL (ref 8–23)
CO2: 23 mmol/L (ref 22–32)
Calcium: 8.7 mg/dL — ABNORMAL LOW (ref 8.9–10.3)
Chloride: 100 mmol/L (ref 98–111)
Creatinine, Ser: 0.95 mg/dL (ref 0.61–1.24)
GFR calc Af Amer: >60 mL/min (ref >60)
GFR calc non Af Amer: >60 mL/min (ref >60)
Glucose, Bld: 195 mg/dL — ABNORMAL HIGH (ref 70–99)
Potassium: 3.5 mmol/L (ref 3.5–5.1)
Sodium: 134 mmol/L — ABNORMAL LOW (ref 135–145)

## 2019-06-15 LAB — GLUCOSE, CAPILLARY
Glucose-Capillary: 154 mg/dL — ABNORMAL HIGH (ref 70–99)
Glucose-Capillary: 182 mg/dL — ABNORMAL HIGH (ref 70–99)
Glucose-Capillary: 195 mg/dL — ABNORMAL HIGH (ref 70–99)
Glucose-Capillary: 257 mg/dL — ABNORMAL HIGH (ref 70–99)
Glucose-Capillary: 295 mg/dL — ABNORMAL HIGH (ref 70–99)

## 2019-06-15 MED ORDER — INSULIN GLARGINE 100 UNIT/ML ~~LOC~~ SOLN
30.0000 [IU] | Freq: Every day | SUBCUTANEOUS | Status: DC
  Administered 2019-06-15: 30 [IU] via SUBCUTANEOUS
  Filled 2019-06-15 (×4): qty 0.3

## 2019-06-15 MED ORDER — PSYLLIUM 95 % PO PACK: 1.0000 | PACK | Freq: Two times a day (BID) | ORAL | Status: AC

## 2019-06-15 MED ORDER — CLONAZEPAM 0.5 MG PO TABS: 0.5000 mg | ORAL_TABLET | Freq: Every day | ORAL | 0 refills | Status: AC

## 2019-06-15 NOTE — Care Management Important Message (Signed)
Important Message  Patient Details  Name: Larry Hart MRN: [id number] Date of Birth: Mar 21, 1946   Medicare Important Message Given:  Yes([place]annon, RN will deliver letter due to precautions)     [name] 06/15/2019, 3:35 PM

## 2019-06-15 NOTE — NC FL2 (Signed)
[place] LEVEL OF CARE SCREENING TOOL     IDENTIFICATION  Patient Name: Larry Hart Birthdate: 19-Aug-1945 Sex: male Admission Date (Current Location): 06/12/2019  [facility] [place] Number:  [employer] and Address:  [place] [address], [place]      Provider Number: [id number]  Attending Physician Name and Address:  Kathie Dike, MD  Relative Name and Phone Number:  Ferlando Lia [phone]    Current Level of Care: Hospital Recommended Level of Care: [place] Prior Approval Number:    Date Approved/Denied:   PASRR Number:    Discharge Plan: SNF    Current Diagnoses: Patient Active Problem List   Diagnosis Date Noted  . Hypokalemia 06/13/2019  . Diarrhea 06/12/2019  . Sepsis ([place]) 05/23/2019  . DKA (diabetic ketoacidoses) ([place]) 05/23/2019  . CKD (chronic kidney disease) stage 3, GFR 30-59 ml/min 05/23/2019  . CAD (coronary artery disease) 05/23/2019  . S/P CABG (coronary artery bypass graft) 05/23/2019  . PAD (peripheral artery disease) ([place]) 05/23/2019  . Dyslipidemia 05/23/2019  . Hypothyroid 05/23/2019  . 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 S/P transmetatarsal amputation of foot, right ([place]) 05/23/2019  . Diabetic foot ulcer ([place]) 05/23/2019  . Normocytic anemia 05/23/2019  . Thrombocytopenia ([place]) 05/23/2019  . Bacteremia due to methicillin susceptible Staphylococcus aureus (MSSA) 05/23/2019  . Enterococcal bacteremia 05/23/2019  . Hypertension   . Diabetes mellitus ([place])     Orientation RESPIRATION BLADDER Height & Weight     Self, Time, Situation, Place  Normal External catheter Weight: 92.6 kg Height:  5\' 9"  (175.3 cm)  BEHAVIORAL SYMPTOMS/MOOD NEUROLOGICAL BOWEL NUTRITION STATUS      Continent Diet  AMBULATORY STATUS COMMUNICATION OF NEEDS Skin   Extensive Assist Verbally PU Stage and Appropriate Care(Left foot stage 2/  Right foot  stage 3)   PU Stage 2 Dressing: (see DC [place])                   Personal Care Assistance Level of Assistance  Bathing, Dressing, Feeding Bathing Assistance: Limited assistance Feeding assistance: Independent Dressing Assistance: Limited assistance     Functional Limitations Info  Sight, Hearing, Speech Sight Info: Adequate Hearing Info: Adequate Speech Info: Adequate    SPECIAL CARE FACTORS FREQUENCY  PT (By licensed PT)     PT Frequency: 5 times a week              Contractures Contractures Info: Not present    Additional Factors Info  Code Status, Allergies Code Status Info: DNR Allergies Info: Honey  bee           Current Medications (06/15/2019):  This is the current hospital active medication list Current Facility-Administered Medications  Medication Dose Route Frequency Provider Last Rate Last Admin  . acetaminophen (TYLENOL) tablet 650 mg  650 mg Oral Q6H PRN Manuella Ghazi, Pratik D, DO       Or  . acetaminophen (TYLENOL) suppository 650 mg  650 mg Rectal Q6H PRN Manuella Ghazi, Pratik D, DO      . alum & mag hydroxide-simeth (MAALOX/MYLANTA) 200-200-20 MG/5ML suspension 30 mL  30 mL Oral Q4H PRN Manuella Ghazi, Pratik D, DO   30 mL at 06/14/19 1247  . amLODipine (NORVASC) tablet 10 mg  10 mg Oral Daily Manuella Ghazi, Pratik D, DO   10 mg at 06/15/19 0859  . aspirin EC tablet 81 mg  81 mg Oral Daily Manuella Ghazi, Pratik D, DO  81 mg at 06/15/19 0859  . Chlorhexidine Gluconate Cloth 2 % PADS 6 each  6 each Topical Daily Heath Lark D, DO   6 each at 06/15/19 [phone]  . clopidogrel (PLAVIX) tablet 75 mg  75 mg Oral Daily Manuella Ghazi, Pratik D, DO   75 mg at 06/15/19 0902  . escitalopram (LEXAPRO) tablet 10 mg  10 mg Oral Daily Manuella Ghazi, Pratik D, DO   10 mg at 06/15/19 2774  . gabapentin (NEURONTIN) capsule 1,200 mg  1,200 mg Oral TID Heath Lark D, DO   1,200 mg at 06/15/19 1287  . heparin injection 5,000 Units  5,000 Units Subcutaneous Q8H Shah, Pratik D, DO   5,000 Units at 06/14/19 2250  . insulin aspart  (novoLOG) injection 0-15 Units  0-15 Units Subcutaneous Q4H Shah, Pratik D, DO   8 Units at 06/15/19 1141  . insulin aspart (novoLOG) injection 0-5 Units  0-5 Units Subcutaneous QHS Manuella Ghazi, Pratik D, DO      . isosorbide mononitrate (IMDUR) 24 hr tablet 30 mg  30 mg Oral Daily Manuella Ghazi, Pratik D, DO   30 mg at 06/15/19 0904  . levothyroxine (SYNTHROID) tablet 50 mcg  50 mcg Oral QAC breakfast Heath Lark D, DO   50 mcg at 06/15/19 0523  . loratadine (CLARITIN) tablet 10 mg  10 mg Oral Daily Manuella Ghazi, Pratik D, DO   10 mg at 06/15/19 0906  . metoCLOPramide (REGLAN) tablet 10 mg  10 mg Oral TID AC Shah, Pratik D, DO   10 mg at 06/15/19 1140  . nitroGLYCERIN (NITROSTAT) SL tablet 0.4 mg  0.4 mg Sublingual Q5 min PRN Manuella Ghazi, Pratik D, DO      . piperacillin-tazobactam (ZOSYN) IVPB 3.375 g  3.375 g Intravenous Q8H Shah, Pratik D, DO 12.5 mL/hr at 06/15/19 0857 3.375 g at 06/15/19 0857  . potassium chloride SA (KLOR-CON) CR tablet 20 mEq  20 mEq Oral BID Heath Lark D, DO   20 mEq at 06/15/19 0859  . promethazine (PHENERGAN) injection 12.5 mg  12.5 mg Intravenous Q6H PRN Kathie Dike, MD   12.5 mg at 06/14/19 0810  . psyllium (HYDROCIL/METAMUCIL) packet 1 packet  1 packet Oral BID Kathie Dike, MD   1 packet at 06/15/19 [phone]     Discharge Medications: Please see discharge summary for a list of discharge medications.  Relevant Imaging Results:  Relevant Lab Results:   Additional Information SS# [id number]  Boneta Lucks, RN

## 2019-06-15 NOTE — Progress Notes (Signed)
Physical Therapy Treatment Patient Details Name: Larry Hart MRN: [id number] DOB: [birth year] Today's Date: 06/15/2019    History of Present Illness Larry Hart is a 74 y.o. male with medical history significant for recent MSSA and enterococcal bacteremia related to diabetic wound infection-currently on Zosyn, type 2 diabetes, hypertension, dyslipidemia, peripheral vascular disease status post right transmetatarsal foot amputation, hypothyroidism, CKD stage III, and prior TAVR with bioprosthetic aortic valve who was brought to the ED with explosive diarrhea along with some nausea and vomiting that began early this morning around 3 AM.  Wife at bedside is the primary historian as patient appears somewhat lethargic.  She states that he had an explosive bowel movement around 3 AM during his sleep and she helped him clean up after which point in time he fell back asleep.  He had another 2 or 3 bowel movements that were liquidy along with nausea and vomiting for couple episodes.  He cannot tolerate anything by mouth and therefore brought him to the ED for further evaluation.  She states she checked his temperature at home and it was 97.8 Fahrenheit.  No chills, abdominal pain, chest pain, shortness of breath, or other concerns noted.  He has been taking his home IV Zosyn as prescribed on recent discharge and has even seen wound care with improvement in his infection noted overall.  His wife is also concerned about the patient becoming more weak lately and has not been able to ambulate very well despite home health physical therapy seeing him.    PT Comments    Patient continues to show generalized weakness and impaired activity tolerance. He is able to complete bed mobility and transfer slowly. He requires use of RW and assist for safety and balance while ambulating. Patient ambulates with slow, labored cadence and appears to be at an increased risk for falls. Patient does not feel safe to return home  due to wife being sick also and not able to assist him. He is off balance when standing and while ambulating and fatigues quickly during today's session. He is able to complete seated exercises following verbal cueing and demonstration. Patient follow up recommendations updated to SNF PT for short term rehab so patient can improve strength, endurance, balance, and activity tolerance to return home safely. Patient will benefit from continued physical therapy in hospital and recommended venue below to increase strength, balance, endurance for safe ADLs and gait.    Follow Up Recommendations  SNF     Equipment Recommendations  None recommended by PT    Recommendations for Other Services       Precautions / Restrictions Precautions Precautions: Fall Restrictions Weight Bearing Restrictions: No    Mobility  Bed Mobility Overal bed mobility: Needs Assistance Bed Mobility: Sidelying to Sit   Sidelying to sit: Supervision;HOB elevated       General bed mobility comments: slightly increased time, labored movement  Transfers Overall transfer level: Needs assistance Equipment used: Rolling walker (2 wheeled) Transfers: Sit to/from [facility] Sit to Stand: Min guard Stand pivot transfers: Min guard       General transfer comment: required assist for safety and balance, became unsteady upon standing initially  Ambulation/Gait Ambulation/Gait assistance: Min guard;Min assist Gait Distance (Feet): 60 Feet Assistive device: Rolling walker (2 wheeled) Gait Pattern/deviations: Decreased step length - right;Decreased step length - left;Decreased stride length Gait velocity: decreased   General Gait Details: attempted a few steps without AD and patient was very unsteady; gait improved with use  of RW, but patient continued to demonstrate some unsteadiness,  demonstrated slightly labored cadence without loss of balance, limited secondary to fatigue and impaired activity  tolerance   Stairs             Wheelchair Mobility    Modified Rankin (Stroke Patients Only)       Balance Overall balance assessment: Needs assistance Sitting-balance support: Feet supported;No upper extremity supported Sitting balance-Leahy Scale: Fair Sitting balance - Comments: fair/good seated at EOB   Standing balance support: Bilateral upper extremity supported;During functional activity Standing balance-Leahy Scale: Fair Standing balance comment: with use of RW, poor without RW                            Cognition Arousal/Alertness: Awake/alert Behavior During Therapy: WFL for tasks assessed/performed Overall Cognitive Status: Within Functional Limits for tasks assessed                                        Exercises General Exercises - Lower Extremity Ankle Circles/Pumps: AROM;Both;20 reps;Seated Long Arc Quad: AROM;Both;20 reps;Seated Toe Raises: AROM;Both;20 reps;Seated Heel Raises: AROM;Both;20 reps;Seated    General Comments        Pertinent Vitals/Pain Pain Assessment: No/denies pain    Home Living                      Prior Function            PT Goals (current goals can now be found in the care plan section) Acute Rehab PT Goals Patient Stated Goal: go to rehab and get stronger to return home with wife PT Goal Formulation: With patient Time For Goal Achievement: 06/21/19 Potential to Achieve Goals: Good Progress towards PT goals: Progressing toward goals    Frequency    Min 3X/week      PT Plan Discharge plan needs to be updated    Co-evaluation              AM-PAC PT "6 Clicks" Mobility   Outcome Measure  Help needed turning from your back to your side while in a flat bed without using bedrails?: None Help needed moving from lying on your back to sitting on the side of a flat bed without using bedrails?: A Little Help needed moving to and from a bed to a chair (including a  wheelchair)?: A Little Help needed standing up from a chair using your arms (e.g., wheelchair or bedside chair)?: A Little Help needed to walk in hospital room?: A Little Help needed climbing 3-5 steps with a railing? : A Lot 6 Click Score: 18    End of Session Equipment Utilized During Treatment: Gait belt Activity Tolerance: Patient tolerated treatment well;Patient limited by fatigue Patient left: with call bell/phone within reach;in bed Nurse Communication: Mobility status PT Visit Diagnosis: Unsteadiness on feet (R26.81);Other abnormalities of gait and mobility (R26.89);Muscle weakness (generalized) (M62.81)     Time: 0923-3007 PT Time Calculation (min) (ACUTE ONLY): 28 min  Charges:  $Therapeutic Exercise: 8-22 mins $Therapeutic Activity: 8-22 mins                     11:16 AM, 06/15/19 Mearl Latin PT, DPT Physical Therapist at [facility]

## 2019-06-15 NOTE — Progress Notes (Signed)
Nursing Home Choices   5.9 mi [facility] [place] [address] [phone] Overall rating Much below average 2. 7.2 mi [facility] and [facility] [address] [place] [address] [phone] Overall rating Not available1 3. 7.3 mi [place], [place] [address] [phone] Overall rating Above average 4. 9.6 mi [facility] [place], [place] [address] [phone] Overall rating Above average 5. 10.1 [place] [address] [phone] Overall rating Much above average 6. 11.3 mi [facility] and [facility] [address] [place] [address] [phone] Overall rating Above average 7. 15.6 mi [facility] and [facility] [place], [place] [address] [phone] Overall rating Below average 8. 19.6 [facility] [address] [place], [place] [address] [phone] Overall rating Much above average 9. 50.0 [facility] [address] [place], [place] [address] [phone] Overall rating Much below average 10. [place] [address] [phone] Overall rating Above average 11. 22.6 mi [facility] [address] [place], [place] [address] [phone] Overall rating Below average 12. 24.5 mi [facility] [address] and [place], [place] [address] [phone] Overall rating Average

## 2019-06-15 NOTE — TOC Transition Note (Addendum)
Transition of Care [facility]) - CM/SW Discharge Note   Patient Details  Name: Larry Hart MRN: [id number] Date of Birth: January 26, 1946  Transition of Care [facility]) CM/SW Contact:  Boneta Lucks, RN Phone Number: 06/15/2019, 3:24 PM   Clinical Narrative:   CM spoke with [name] - Daughter this AM. She lives in [place] and concerned her mother is not able to care for her father at home at this time. Mother is currently not feeling well. Patient continues to be weak, he is now agreeing to SNF and PT is recommending SNF.  Choices given to patient. He is wanting [facility] in [place], [place].  [place] done and sent out.  Arbie Cookey from [facility] called to make a bed offer and can take patient today. Patient is medically ready per MD. DC summary faxed to Arbie Cookey, RN updated and number given to call report and EMS.  [place] update with dc plan. Patient wife with take belonging needed to facility.   TOC updated [facility] home [facility] with DC plan. Patient active with HHRN/PT   Final next level of care: Skilled Nursing Facility Barriers to Discharge: Barriers Resolved   Patient Goals and CMS Choice Patient states their goals for this hospitalization and ongoing recovery are:: to go to SNF then return home. CMS Medicare.gov Compare Post Acute Care list provided to:: Patient Choice offered to / list presented to : Patient  Discharge Placement              Patient chooses bed at: [facility], [place], [place]) Patient to be transferred to facility by: EMS Name of family member notified: [name] - daughter Patient and family notified of of transfer: 06/15/19  Discharge Plan and [place], [place]

## 2019-06-15 NOTE — Progress Notes (Signed)
Nsg Discharge Note  Admit Date:  06/12/2019 Discharge date: 06/15/2019   Larry Hart to be D/C'd Skilled nursing facility per MD order.  AVS completed.  Copy for chart, and copy for patient signed, and dated. Patient/caregiver able to verbalize understanding.  Discharge Medication: Allergies as of 06/15/2019      Reactions   Honey Bee Treatment [bee Venom]    Other Palpitations, Rash   Histalet, tears up nerves,possible rash      Medication List    STOP taking these medications   chlorthalidone 25 MG tablet Commonly known as: HYGROTON   Klor-Con M20 20 MEQ tablet Generic drug: potassium chloride SA     TAKE these medications   amLODipine 10 MG tablet Commonly known as: NORVASC Take 10 mg by mouth daily.   aspirin 81 MG tablet Take 81 mg by mouth daily.   cetirizine 10 MG tablet Commonly known as: ZYRTEC Take 10 mg by mouth daily as needed for allergies.   clonazePAM 0.5 MG tablet Commonly known as: KLONOPIN Take 1 tablet (0.5 mg total) by mouth at bedtime.   clopidogrel 75 MG tablet Commonly known as: PLAVIX Take 75 mg by mouth daily.   Coenzyme Q10 100 MG Tabs Take 1 tablet by mouth daily.   escitalopram 10 MG tablet Commonly known as: LEXAPRO Take 10 mg by mouth daily.   fenofibrate 160 MG tablet Take 160 mg by mouth daily.   gabapentin 600 MG tablet Commonly known as: NEURONTIN Take 1,200 mg by mouth 3 (three) times daily.   insulin glargine 100 UNIT/ML injection Commonly known as: LANTUS Inject 30 Units into the skin 2 (two) times daily.   isosorbide mononitrate 30 MG 24 hr tablet Commonly known as: IMDUR Take 30 mg by mouth daily.   ketoconazole 2 % cream Commonly known as: NIZORAL Apply 1 application topically daily as needed for irritation.   levothyroxine 50 MCG tablet Commonly known as: SYNTHROID Take 50 mcg by mouth daily before breakfast.   losartan 100 MG tablet Commonly known as: COZAAR Take 100 mg by mouth daily.    metoCLOPramide 10 MG tablet Commonly known as: REGLAN Take 10 mg by mouth every 8 (eight) hours as needed for nausea or vomiting.   multivitamin capsule Take 1 capsule by mouth daily. Adults 50+   Nitrostat 0.4 MG SL tablet Generic drug: nitroGLYCERIN Place 0.4 mg under the tongue every 5 (five) minutes as needed for chest pain.   NovoLIN R ReliOn 100 units/mL injection Generic drug: insulin regular Inject 20-50 Units into the skin 3 (three) times daily before meals. Per sliding scale as directed per dietician   omega-3 acid ethyl esters 1 g capsule Commonly known as: LOVAZA Take 2 g by mouth 2 (two) times daily.   pantoprazole 40 MG tablet Commonly known as: PROTONIX Take 40 mg by mouth daily.   piperacillin-tazobactam  IVPB Commonly known as: ZOSYN Inject 13.5 g into the vein daily for 20 days. As a continuous infusion  Indication:  Bacteremia and diabetic foot infection Last Day of Therapy:  06/18/2019 Labs - Once weekly:  CBC/D and BMP, Labs - Every other week:  ESR and CRP   pravastatin 40 MG tablet Commonly known as: PRAVACHOL Take 40 mg by mouth daily.   psyllium 95 % Pack Commonly known as: HYDROCIL/METAMUCIL Take 1 packet by mouth 2 (two) times daily.   vitamin C 1000 MG tablet Take 1,000 mg by mouth daily.   Vitamin D3 50 MCG (2000 UT) Tabs  Take 1 tablet by mouth daily.       Discharge Assessment: Vitals:   06/15/19 0859 06/15/19 1352  BP: 137/64 (!) 152/73  Pulse:  68  Resp:  18  Temp:  97.7 F (36.5 C)  SpO2:  98%   Skin clean, dry and intact without evidence of skin break down, no evidence of skin tears noted. IV catheter discontinued intact. Site without signs and symptoms of complications - no redness or edema noted at insertion site, patient denies c/o pain - only slight tenderness at site.  Dressing with slight pressure applied.  D/c Instructions-Education: Discharge instructions given to patient/family with verbalized understanding. D/c  education completed with patient/family including follow up instructions, medication list, d/c activities limitations if indicated, with other d/c instructions as indicated by MD - patient able to verbalize understanding, all questions fully answered. Patient instructed to return to ED, call 911, or call MD for any changes in condition.  Patient escorted via [place], and D/C home via private auto.  Zenaida Deed, RN 06/15/2019 6:12 PM

## 2019-06-15 NOTE — Progress Notes (Addendum)
Inpatient Diabetes Program Recommendations  AACE/ADA: New Consensus Statement on Inpatient Glycemic Control (2015)  Target Ranges:  Prepandial:   less than 140 mg/dL      Peak postprandial:   less than 180 mg/dL (1-2 hours)      Critically ill patients:  140 - 180 mg/dL   Lab Results  Component Value Date   GLUCAP 257 (H) 06/15/2019   HGBA1C 6.7 (H) 05/22/2019    Review of Glycemic Control Results for Larry Hart, Larry Hart (MRN [id number]) as of 06/15/2019 11:22  Ref. Range 06/14/2019 20:45 06/15/2019 00:35 06/15/2019 05:21 06/15/2019 07:21 06/15/2019 11:09  Glucose-Capillary Latest Ref Range: 70 - 99 mg/dL 271 (H) 195 (H) 182 (H) 154 (H) 257 (H)   Diabetes history: DM 2 Outpatient Diabetes medications:  Lantus 30 units bid Novolin R- Relion 20-50 units tid with meals Current orders for Inpatient glycemic control:  Novolog moderate q 4 hours Inpatient Diabetes Program Recommendations:   Please consider adding Lantus 30 units daily.  Also consider changing Novolog correction to tid with meals and HS (instead of q 4 hours).  Consider adding Novolog 4 units tid with meals.   Thanks  Adah Perl, RN, BC-ADM Inpatient Diabetes Coordinator Pager [phone] (8a-5p)

## 2019-06-15 NOTE — Progress Notes (Signed)
patient being discharged to [facility]. EMS called, report given to nurse at [facility]. Awaiting for EMS to pick patient up. Patient will be discharge with picc line in place.

## 2019-06-15 NOTE — Discharge Summary (Addendum)
Physician Discharge Summary  Larry Hart NFA:[id number] DOB: 1945/04/24 DOA: 06/12/2019  PCP: Rory Percy, MD  Admit date: 06/12/2019 Discharge date: 06/15/2019  Admitted From: Home Disposition: Skilled nursing facility  Recommendations for Outpatient Follow-up:  Follow up with PCP in 1-2 weeks Please obtain BMP/CBC in one week Follow-up with infectious disease, Dr. Megan Salon on 3/8 as previously scheduled Patient will be continued on intravenous Zosyn through 3/8 for MSSA/enterococcal bacteremia GI pathogen panel pending at the time of discharge.  Since patient is clinically improved, results will not change current management.   Discharge Condition: Stable CODE STATUS: DNR Diet recommendation: Heart healthy, carb modified  Brief/Interim Summary: 74 year old male with a history of hypertension, diabetes, peripheral vascular disease, recent admission for MSSA and enterococcal bacteremia from diabetic foot wound, on chronic Zosyn until 3/8, admitted to the hospital with nausea, vomiting and diarrhea.  He was noted to be dehydrated and had electrolyte abnormalities.  Admitted for IV fluids and electrolyte replacement.    Discharge Diagnoses:  Active Problems:   Hypertension   Diabetes mellitus ([place])   PAD (peripheral artery disease) (HCC)   Hypothyroid   Bacteremia due to methicillin susceptible Staphylococcus aureus (MSSA)   Enterococcal bacteremia   Diarrhea   Hypokalemia  Acute onset diarrhea/vomiting. Stool for C. difficile was sent and was found to be antigen positive, but toxin PCR negative.  Discussed with Dr. Baxter Flattery, infectious disease and patient likely colonized with nontoxigenic C. difficile.  Patient did not have any fever, leukocytosis and clinically his bowel movements have improved.  He is no longer having diarrhea. Unlikely to be active C. difficile.  Since his wife is having similar symptoms, this would suggest an underlying infectious process.    GI pathogen panel  currently in process, although since patient is clinically improved, would not change management at this point.  Would continue to treat supportively for now. Electrolyte abnormalities including hypomagnesemia and hypokalemia.  Related to GI losses.  He was also on chlorthalidone.    This is been replaced Recent MSSA and enterococcal bacteremia from infected diabetic foot ulcer.  Continuing Zosyn through 3/8 per ID recommendations. Type 2 diabetes.  Stable.    Resume Lantus and NovoLog on discharge Diabetic neuropathy.  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  Continue on aspirin Plavix. Hypertension.  Continue on amlodipine.  Blood pressure currently stable. Hypothyroidism.  Continue Synthroid. Generalized weakness.  Seen by physical therapy with recommendations for skilled nursing facility placement AKI. Related to volume loss from GI illness. Resolved with IV fluids  Discharge Instructions  Discharge Instructions     Diet - low sodium heart healthy   Complete by: As directed    Increase activity slowly   Complete by: As directed       Allergies as of 06/15/2019       Reactions   Honey Bee Treatment [bee Venom]    Other Palpitations, Rash   Histalet, tears up nerves,possible rash        Medication List     STOP taking these medications    chlorthalidone 25 MG tablet Commonly known as: HYGROTON   Klor-Con M20 20 MEQ tablet Generic drug: potassium chloride SA       TAKE these medications    amLODipine 10 MG tablet Commonly known as: NORVASC Take 10 mg by mouth daily.   aspirin 81 MG tablet Take 81 mg by mouth daily.   cetirizine 10 MG tablet Commonly known as: ZYRTEC Take 10 mg by  mouth daily as needed for allergies.   clonazePAM 0.5 MG tablet Commonly known as: KLONOPIN Take 1 tablet (0.5 mg total) by mouth at bedtime.   clopidogrel 75 MG tablet Commonly known as: PLAVIX Take 75 mg by mouth daily.    Coenzyme Q10 100 MG Tabs Take 1 tablet by mouth daily.   escitalopram 10 MG tablet Commonly known as: LEXAPRO Take 10 mg by mouth daily.   fenofibrate 160 MG tablet Take 160 mg by mouth daily.   gabapentin 600 MG tablet Commonly known as: NEURONTIN Take 1,200 mg by mouth 3 (three) times daily.   insulin glargine 100 UNIT/ML injection Commonly known as: LANTUS Inject 30 Units into the skin 2 (two) times daily.   isosorbide mononitrate 30 MG 24 hr tablet Commonly known as: IMDUR Take 30 mg by mouth daily.   ketoconazole 2 % cream Commonly known as: NIZORAL Apply 1 application topically daily as needed for irritation.   levothyroxine 50 MCG tablet Commonly known as: SYNTHROID Take 50 mcg by mouth daily before breakfast.   losartan 100 MG tablet Commonly known as: COZAAR Take 100 mg by mouth daily.   metoCLOPramide 10 MG tablet Commonly known as: REGLAN Take 10 mg by mouth every 8 (eight) hours as needed for nausea or vomiting.   multivitamin capsule Take 1 capsule by mouth daily. Adults 50+   Nitrostat 0.4 MG SL tablet Generic drug: nitroGLYCERIN Place 0.4 mg under the tongue every 5 (five) minutes as needed for chest pain.   NovoLIN R ReliOn 100 units/mL injection Generic drug: insulin regular Inject 20-50 Units into the skin 3 (three) times daily before meals. Per sliding scale as directed per dietician   omega-3 acid ethyl esters 1 g capsule Commonly known as: LOVAZA Take 2 g by mouth 2 (two) times daily.   pantoprazole 40 MG tablet Commonly known as: PROTONIX Take 40 mg by mouth daily.   piperacillin-tazobactam  IVPB Commonly known as: ZOSYN Inject 13.5 g into the vein daily for 20 days. As a continuous infusion  Indication:  Bacteremia and diabetic foot infection Last Day of Therapy:  06/18/2019 Labs - Once weekly:  CBC/D and BMP, Labs - Every other week:  ESR and CRP   pravastatin 40 MG tablet Commonly known as: PRAVACHOL Take 40 mg by mouth  daily.   psyllium 95 % Pack Commonly known as: HYDROCIL/METAMUCIL Take 1 packet by mouth 2 (two) times daily.   vitamin C 1000 MG tablet Take 1,000 mg by mouth daily.   Vitamin D3 50 MCG (2000 UT) Tabs Take 1 tablet by mouth daily.       Follow-up Information     [facility] up.   Why:   They will call to make an appointment for PT and RN          Allergies  Allergen Reactions   Honey Bee Treatment [Bee Venom]    Other Palpitations and Rash    Histalet, tears up nerves,possible rash    Consultations:    Procedures/Studies: DG Chest 1 View  Result Date: 05/24/2019 CLINICAL DATA:  Short of breath, hypoxemia EXAM: CHEST  1 VIEW COMPARISON:  05/22/2019 FINDINGS: Single frontal view of the chest demonstrates an enlarged cardiac silhouette. Stable median sternotomy and aortic valve prosthesis. Since the prior exam, increased central vascular congestion and perihilar airspace disease. Small left pleural effusion. No pneumothorax. IMPRESSION: 1. Findings consistent with pulmonary edema and congestive heart failure. Electronically Signed   By: Randa Ngo  M.D.   On: 05/24/2019 18:19   MR FOOT RIGHT WO CONTRAST  Result Date: 05/23/2019 CLINICAL DATA:  Osteomyelitis of the right foot. Serosanguineous just charge from the ulcer of the right foot. EXAM: MRI OF THE RIGHT FOREFOOT WITHOUT CONTRAST TECHNIQUE: Multiplanar, multisequence MR imaging of the right forefoot was performed. No intravenous contrast was administered. COMPARISON:  None. FINDINGS: Bones/Joint/Cartilage Prior transmetatarsal amputation. No fracture or dislocation. Normal alignment. No joint effusion. Ligaments, Muscles and Tendons Flexor, peroneal and extensor compartment tendons are intact. Generalized muscle atrophy. Soft tissue No fluid collection or hematoma. No soft tissue mass. Soft tissue edema along the dorsal aspect of the foot which may be reactive versus secondary to mild cellulitis.  IMPRESSION: 1. Prior transmetatarsal amputation. No osteomyelitis of the right foot. Electronically Signed   By: Kathreen Devoid   On: 05/23/2019 09:29   DG Chest Port 1 View  Result Date: 06/12/2019 CLINICAL DATA:  Weakness. Additional history provided by technologist: Vomiting and diarrhea since last night. EXAM: PORTABLE CHEST 1 VIEW COMPARISON:  Chest radiograph 05/24/2019 FINDINGS: Right-sided PICC with tip projecting in the region of the caudal SVC. Prior median sternotomy. Unchanged cardiomegaly with aortic valve prosthesis. Aortic atherosclerosis. Pulmonary edema demonstrated on chest radiograph 05/24/2019 has resolved. No evidence of airspace consolidation within the lungs. No evidence of pleural effusion or pneumothorax. No acute bony abnormality. IMPRESSION: No evidence of acute cardiopulmonary abnormality. Cardiomegaly with evidence of prior median sternotomy and aortic valve replacement. Aortic atherosclerosis. Electronically Signed   By: Kellie Simmering DO   On: 06/12/2019 12:25   DG Chest Port 1 View  Result Date: 05/22/2019 CLINICAL DATA:  Sepsis. Fever and vomiting. EXAM: PORTABLE CHEST 1 VIEW COMPARISON:  Lung bases from abdominal CT yesterday at [facility]: Post median sternotomy. Prosthetic aortic valve. Mild cardiomegaly. No focal airspace disease, large pleural effusion, or pulmonary edema. No pneumothorax. No acute osseous abnormalities are seen. IMPRESSION: Mild cardiomegaly. No acute chest findings or evidence of pneumonia. Electronically Signed   By: Keith Rake M.D.   On: 05/22/2019 20:38   ECHOCARDIOGRAM COMPLETE  Result Date: 05/24/2019    ECHOCARDIOGRAM REPORT   Patient Name:   EPIFANIO LABRADOR Date of Exam: 05/24/2019 Medical Rec #:  [id number]       Height:       69.0 in Accession #:    [id number]      Weight:       228.0 lb Date of Birth:  [birth year]       BSA:          2.18 m Patient Age:    32 years        BP:           115/64 mmHg Patient Gender: M                HR:           68 bpm. Exam Location:  [facility] Procedure: 2D Echo Indications:    Bacteremia 790.7 / R78.81  History:        Patient has prior history of Echocardiogram examinations, most                 recent 01/19/2012. CAD, Prior CABG, Signs/Symptoms:Bacteremia;                 Risk Factors:Dyslipidemia, Hypertension, Diabetes and                 Non-Smoker. S/P TAVR (transcatheter aortic valve replacement.  Sonographer:    Leavy Cella RDCS (AE) Referring Phys: [phone] Royanne Foots [place]  1. There is a ventricular septal bounce.. Left ventricular ejection fraction, by estimation, is 60 to 65%. The left ventricle has normal function. The left ventrical has no regional wall motion abnormalities. There is moderately increased left ventricular hypertrophy. Left ventricular diastolic parameters are indeterminate.  2. Right ventricular systolic function is mildly reduced. The right ventricular size is mildly enlarged. There is moderately elevated pulmonary artery systolic pressure.  3. Left atrial size was severely dilated.  4. Right atrial size was mildly dilated.  5. The mitral valve is normal in structure and function. Mild mitral valve regurgitation. No evidence of mitral stenosis.  6. Edwards 26 mm Sapien 3 Ultra valve in the AV position. Mean gradient slightly above reported normals for valve, slightly above prior mean gradient of 15 mmHg from 10/18/2018 echo. Mild difference would not be considered significant. . The aortic valve is normal in structure and function. Aortic valve regurgitation is not visualized. FINDINGS  Left Ventricle: There is a ventricular septal bounce. Left ventricular ejection fraction, by estimation, is 60 to 65%. The left ventricle has normal function. The left ventricle has no regional wall motion abnormalities. There is moderately increased left ventricular hypertrophy. Left ventricular diastolic parameters are indeterminate. Right Ventricle: The right ventricular size is  mildly enlarged. Right vetricular wall thickness was not assessed. Right ventricular systolic function is mildly reduced. There is moderately elevated pulmonary artery systolic pressure. The tricuspid regurgitant velocity is 2.85 m/s, and with an assumed right atrial pressure of 10 mmHg, the estimated right ventricular systolic pressure is 68.1 mmHg. Left Atrium: Left atrial size was severely dilated. Right Atrium: Right atrial size was mildly dilated. Pericardium: There is no evidence of pericardial effusion. Mitral Valve: The mitral valve is normal in structure and function. Mild mitral valve regurgitation. No evidence of mitral valve stenosis. Tricuspid Valve: The tricuspid valve is normal in structure. Tricuspid valve regurgitation is mild . No evidence of tricuspid stenosis. Aortic Valve: Edwards 26 mm Sapien 3 Ultra valve in the AV position. Mean gradient slightly above reported normals for valve, slightly above prior mean gradient of 15 mmHg from 10/18/2018 echo. Mild difference would not be considered significant. The aortic valve is normal in structure and function. Aortic valve regurgitation is not visualized. Aortic valve mean gradient measures 17.8 mmHg. Aortic valve peak gradient measures 31.6 mmHg. Pulmonic Valve: The pulmonic valve was not well visualized. Pulmonic valve regurgitation is not visualized. No evidence of pulmonic stenosis. Aorta: The aortic root is normal in size and structure. Pulmonary Artery: Indeterminate PASP, poorly visualized IVC. Venous: The inferior vena cava was not well visualized. IAS/Shunts: No atrial level shunt detected by color flow Doppler.  LEFT VENTRICLE PLAX 2D LVIDd:         4.24 cm Diastology LVIDs:         2.85 cm LV e' lateral:   7.83 cm/s LV PW:         1.20 cm LV E/e' lateral: 12.0 LV IVS:        1.17 cm LV e' medial:    5.98 cm/s LV SV Index:   21.71   LV E/e' medial:  15.7  RIGHT VENTRICLE RV S prime:     9.68 cm/s TAPSE (M-mode): 1.1 cm LEFT ATRIUM            Index       RIGHT ATRIUM  Index LA diam:      4.30 cm 1.97 cm/m  RA Area:     18.90 cm LA Vol (A2C): 86.8 ml 39.74 ml/m RA Volume:   50.30 ml  23.03 ml/m LA Vol (A4C): 97.1 ml 44.46 ml/m  AORTIC VALVE AV Vmax:           280.89 cm/s AV Vmean:          199.593 cm/s AV VTI:            0.595 m AV Peak Grad:      31.6 mmHg AV Mean Grad:      17.8 mmHg LVOT Vmax:         79.67 cm/s LVOT Vmean:        56.372 cm/s LVOT VTI:          0.156 m LVOT/AV VTI ratio: 0.26  AORTA Ao Root diam: 3.20 cm MITRAL VALVE                        TRICUSPID VALVE MV Area (PHT): 3.03 cm             TR Peak grad:   32.5 mmHg MV Decel Time: 250 msec             TR Vmax:        285.00 cm/s MV E velocity: 93.60 cm/s 103 cm/s MV A velocity: 34.00 cm/s 70.3 cm/s SHUNTS MV E/A ratio:  2.75       1.5       Systemic VTI: 0.16 m Carlyle Dolly MD Electronically signed by Carlyle Dolly MD Signature Date/Time: 05/24/2019/2:18:21 PM    Final    ECHO TEE  Result Date: 05/28/2019    TRANSESOPHOGEAL ECHO REPORT   Patient Name:   BRENNEN CAMPER Date of Exam: 05/28/2019 Medical Rec #:  [id number]       Height:       69.0 in Accession #:    [id number]      Weight:       209.0 lb Date of Birth:  April 27, 1945       BSA:          2.10 m Patient Age:    54 years        BP:           135/74 mmHg Patient Gender: M               HR:           57 bpm. Exam Location:  [facility] Procedure: Transesophageal Echo and Color Doppler Indications:    bactermia  History:        Patient has prior history of Echocardiogram examinations, most                 recent 05/24/2019. Signs/Symptoms:Bacteremia; Risk                 Factors:Diabetes, Non-Smoker, Hypertension and Dyslipidemia.                 Sepsis, TAVR.  Sonographer:    Leavy Cella RDCS (AE) Referring Phys: [id number] Royanne Foots [facility] PROCEDURE: The transesophogeal probe was passed without difficulty through the esophogus of the patient. Sedation performed by different physician. The patient developed no  complications during the procedure. IMPRESSIONS  1. Left ventricular ejection fraction, by estimation, is 60 to 65%. The left ventricle has normal function. The left ventricle has no regional wall motion abnormalities.  There is moderate concentric left ventricular hypertrophy. Left ventricular diastolic function could not be evaluated.  2. Right ventricular systolic function is mildly reduced. The right ventricular size is mildly enlarged.  3. Normal intracavitary left atrial appendage velocities. Left atrial size was severely dilated. No left atrial/left atrial appendage thrombus was detected.  4. Right atrial size was mildly dilated.  5. The mitral valve is grossly normal. Mild to moderate mitral valve regurgitation.  6. Edwards 26 mm Sapien 3 Ultra valve in the AV position. It is functioning normally. The aortic valve has been repaired/replaced. Aortic valve regurgitation is not visualized. No aortic stenosis is present. Conclusion(s)/Recomendation(s): No evidence of vegetation/infective endocarditis on this transesophageal echocardiogram. FINDINGS  Left Ventricle: Left ventricular ejection fraction, by estimation, is 60 to 65%. The left ventricle has normal function. The left ventricle has no regional wall motion abnormalities. There is moderate concentric left ventricular hypertrophy. Left ventricular diastolic function could not be evaluated. Right Ventricle: The right ventricular size is mildly enlarged. No increase in right ventricular wall thickness. Right ventricular systolic function is mildly reduced. Left Atrium: Normal intracavitary left atrial appendage velocities. Left atrial size was severely dilated. No left atrial/left atrial appendage thrombus was detected. Right Atrium: Right atrial size was mildly dilated. Pericardium: There is no evidence of pericardial effusion. Mitral Valve: The mitral valve is grossly normal. Mild to moderate mitral valve regurgitation. Tricuspid Valve: The tricuspid valve  is grossly normal. Tricuspid valve regurgitation is mild. Aortic Valve: Edwards 26 mm Sapien 3 Ultra valve in the AV position. It is functioning normally. The aortic valve has been repaired/replaced. Aortic valve regurgitation is not visualized. No aortic stenosis is present. Pulmonic Valve: The pulmonic valve was grossly normal. Pulmonic valve regurgitation is not visualized. Aorta: The aortic root is normal in size and structure. There is minimal (Grade I) plaque. IAS/Shunts: No atrial level shunt detected by color flow Doppler. Kate Sable MD Electronically signed by Kate Sable MD Signature Date/Time: 05/28/2019/4:32:36 PM    Final    [place] EKG SITE RITE  Result Date: 05/29/2019 If Site Rite image not attached, placement could not be confirmed due to current cardiac rhythm.     Subjective:  Patient is feeling substantially improved.  No loose stools since yesterday at 5 PM.  No vomiting.  Tolerating diet.  Discharge Exam: Vitals:   06/14/19 2134 06/15/19 0500 06/15/19 0859 06/15/19 1352  BP:   137/64 (!) 152/73  Pulse:  68  68  Resp:  20  18  Temp:  98.2 F (36.8 C)  97.7 F (36.5 C)  TempSrc:  Oral  Oral  SpO2: 99% 98%  98%  Weight:  92.6 kg    Height:        General: Pt is alert, awake, not in acute distress Cardiovascular: RRR, S1/S2 +, no rubs, no gallops Respiratory: CTA bilaterally, no wheezing, no rhonchi Abdominal: Soft, NT, ND, bowel sounds + Extremities: Right transmetatarsal amputation   The results of significant diagnostics from this hospitalization (including imaging, microbiology, ancillary and laboratory) are listed below for reference.     Microbiology: Recent Results (from the past 240 hour(s))  Culture, blood (routine x 2)     Status: None (Preliminary result)   Collection Time: 06/12/19 11:52 AM   Specimen: BLOOD  Result Value Ref Range Status   Specimen Description BLOOD  Final   Special Requests NONE  Final   Culture   Final    NO  GROWTH 3 DAYS Performed at [facility]  [facility], [address] [place], [place] [address]    Report Status PENDING  Incomplete  Culture, blood (routine x 2)     Status: None (Preliminary result)   Collection Time: 06/12/19 11:52 AM   Specimen: BLOOD  Result Value Ref Range Status   Specimen Description BLOOD  Final   Special Requests NONE  Final   Culture   Final    NO GROWTH 3 DAYS Performed at [facility], [address] [place], [place] [address]    Report Status PENDING  Incomplete  SARS CORONAVIRUS 2 (TAT 6-24 HRS) Nasopharyngeal Nasopharyngeal Swab     Status: None   Collection Time: 06/12/19 12:17 PM   Specimen: Nasopharyngeal Swab  Result Value Ref Range Status   SARS Coronavirus 2 NEGATIVE NEGATIVE Final    Comment: (NOTE) SARS-CoV-2 target nucleic acids are NOT DETECTED. The SARS-CoV-2 RNA is generally detectable in upper and lower respiratory specimens during the acute phase of infection. Negative results do not preclude SARS-CoV-2 infection, do not rule out co-infections with other pathogens, and should not be used as the sole basis for treatment or other patient management decisions. Negative results must be combined with clinical observations, patient history, and epidemiological information. The expected result is Negative. Fact Sheet for Patients: SugarRoll.be Fact Sheet for Healthcare Providers: https://www.woods-mathews.com/ This test is not yet approved or cleared by the [place] FDA and  has been authorized for detection and/or diagnosis of SARS-CoV-2 by FDA under an Emergency Use Authorization (EUA). This EUA will remain  in effect (meaning this test can be used) for the duration of the COVID-19 declaration under Section 56 4(b)(1) of the Act, 21 U.S.C. section 360bbb-3(b)(1), unless the authorization is terminated or revoked sooner. Performed at [facility], [place] [address] [place], [place] [address]   C  Difficile Quick Screen w PCR reflex     Status: Abnormal   Collection Time: 06/12/19  3:08 PM   Specimen: STOOL  Result Value Ref Range Status   C Diff antigen POSITIVE (A) NEGATIVE Final   C Diff toxin NEGATIVE NEGATIVE Final   C Diff interpretation Results are indeterminate. See PCR results.  Final    Comment: Performed at [facility], [address], [place], [place] [address]  C. Diff by PCR, Reflexed     Status: None   Collection Time: 06/12/19  3:08 PM  Result Value Ref Range Status   Toxigenic C. Difficile by PCR NEGATIVE NEGATIVE Final    Comment: Patient is colonized with non toxigenic C. difficile. May not need treatment unless significant symptoms are present. Performed at [facility], [place] [address], [place], [place] [address]      Labs: BNP (last 3 results) Recent Labs    05/24/19 1720  BNP 253.6*   Basic Metabolic Panel: Recent Labs  Lab 06/12/19 1152 06/12/19 1411 06/13/19 0412 06/14/19 0345 06/15/19 0357  NA 132*  --  134* 137 134*  K 3.1*  --  3.2* 3.4* 3.5  CL 96*  --  101 103 100  CO2 23  --  '23 24 23  ' GLUCOSE 146*  --  145* 152* 195*  BUN 25*  --  '19 13 13  ' CREATININE 1.36* 1.21 1.18 1.06 0.95  CALCIUM 9.2  --  8.5* 8.6* 8.7*  MG 1.5*  --  1.9  --   --    Liver Function Tests: Recent Labs  Lab 06/12/19 1152 06/13/19 0412  AST 18 19  ALT 15 14  ALKPHOS 41 41  BILITOT 1.0 0.8  PROT 7.9 7.0  ALBUMIN 3.7 3.2*   Recent Labs  Lab 06/12/19 1152  LIPASE 18   No results for input(s): AMMONIA in the last 168 hours. CBC: Recent Labs  Lab 06/12/19 1152 06/12/19 1411 06/13/19 0412  WBC 8.4 7.4 4.0  NEUTROABS 7.9*  --   --   HGB 12.9* 12.6* 11.8*  HCT 39.7 38.0* 36.4*  MCV 93.6 93.1 95.3  PLT 210 185 177   Cardiac Enzymes: No results for input(s): CKTOTAL, CKMB, CKMBINDEX, TROPONINI in the last 168 hours. BNP: Invalid input(s): POCBNP CBG: Recent Labs  Lab 06/14/19 2045 06/15/19 0035 06/15/19 0521 06/15/19 0721  06/15/19 1109  GLUCAP 271* 195* 182* 154* 257*   D-Dimer No results for input(s): DDIMER in the last 72 hours. Hgb A1c No results for input(s): HGBA1C in the last 72 hours. Lipid Profile No results for input(s): CHOL, HDL, LDLCALC, TRIG, CHOLHDL, LDLDIRECT in the last 72 hours. Thyroid function studies No results for input(s): TSH, T4TOTAL, T3FREE, THYROIDAB in the last 72 hours.  Invalid input(s): FREET3 Anemia work up No results for input(s): VITAMINB12, FOLATE, FERRITIN, TIBC, IRON, RETICCTPCT in the last 72 hours. Urinalysis    Component Value Date/Time   COLORURINE YELLOW 05/22/2019 1952   APPEARANCEUR CLEAR 05/22/2019 1952   LABSPEC 1.026 05/22/2019 1952   PHURINE 5.0 05/22/2019 1952   GLUCOSEU >=500 (A) 05/22/2019 1952   HGBUR SMALL (A) 05/22/2019 [place] NEGATIVE 05/22/2019 1952   KETONESUR 20 (A) 05/22/2019 1952   PROTEINUR 30 (A) 05/22/2019 1952   NITRITE NEGATIVE 05/22/2019 1952   LEUKOCYTESUR NEGATIVE 05/22/2019 1952   Sepsis Labs Invalid input(s): PROCALCITONIN,  WBC,  LACTICIDVEN Microbiology Recent Results (from the past 240 hour(s))  Culture, blood (routine x 2)     Status: None (Preliminary result)   Collection Time: 06/12/19 11:52 AM   Specimen: BLOOD  Result Value Ref Range Status   Specimen Description BLOOD  Final   Special Requests NONE  Final   Culture   Final    NO GROWTH 3 DAYS Performed at [facility], [address] [place], [place] [address]    Report Status PENDING  Incomplete  Culture, blood (routine x 2)     Status: None (Preliminary result)   Collection Time: 06/12/19 11:52 AM   Specimen: BLOOD  Result Value Ref Range Status   Specimen Description BLOOD  Final   Special Requests NONE  Final   Culture   Final    NO GROWTH 3 DAYS Performed at [facility], [address] [place], [place] [address]    Report Status PENDING  Incomplete  SARS CORONAVIRUS 2 (TAT 6-24 HRS) Nasopharyngeal Nasopharyngeal Swab     Status: None    Collection Time: 06/12/19 12:17 PM   Specimen: Nasopharyngeal Swab  Result Value Ref Range Status   SARS Coronavirus 2 NEGATIVE NEGATIVE Final    Comment: (NOTE) SARS-CoV-2 target nucleic acids are NOT DETECTED. The SARS-CoV-2 RNA is generally detectable in upper and lower respiratory specimens during the acute phase of infection. Negative results do not preclude SARS-CoV-2 infection, do not rule out co-infections with other pathogens, and should not be used as the sole basis for treatment or other patient management decisions. Negative results must be combined with clinical observations, patient history, and epidemiological information. The expected result is Negative. Fact Sheet for Patients: SugarRoll.be Fact Sheet for Healthcare Providers: https://www.woods-mathews.com/ This test is not yet approved or cleared by the  [place] Architectural technologist and  has been authorized for detection and/or diagnosis of SARS-CoV-2 by FDA under an Print production planner (EUA). This EUA will remain  in effect (meaning this test can be used) for the duration of the COVID-19 declaration under Section 56 4(b)(1) of the Act, 21 U.S.C. section 360bbb-3(b)(1), unless the authorization is terminated or revoked sooner. Performed at [facility], [place] [address], [place], [place] [address]   C Difficile Quick Screen w PCR reflex     Status: Abnormal   Collection Time: 06/12/19  3:08 PM   Specimen: STOOL  Result Value Ref Range Status   C Diff antigen POSITIVE (A) NEGATIVE Final   C Diff toxin NEGATIVE NEGATIVE Final   C Diff interpretation Results are indeterminate. See PCR results.  Final    Comment: Performed at [facility], [address] [place], [place] [address]  C. Diff by PCR, Reflexed     Status: None   Collection Time: 06/12/19  3:08 PM  Result Value Ref Range Status   Toxigenic C. Difficile by PCR NEGATIVE NEGATIVE Final    Comment: Patient is  colonized with non toxigenic C. difficile. May not need treatment unless significant symptoms are present. Performed at [facility], [place] [address], [place], [place] [address]      Time coordinating discharge: 44mns  SIGNED:   JKathie Dike MD  [facility] 06/15/2019, 2:57 PM   If 7PM-7AM, please contact night-coverage [email or web address]

## 2019-06-16 DIAGNOSIS — B9561 Methicillin susceptible Staphylococcus aureus infection as the cause of diseases classified elsewhere: Secondary | ICD-10-CM | POA: Diagnosis not present

## 2019-06-16 DIAGNOSIS — I739 Peripheral vascular disease, unspecified: Secondary | ICD-10-CM | POA: Diagnosis not present

## 2019-06-16 DIAGNOSIS — E11621 Type 2 diabetes mellitus with foot ulcer: Secondary | ICD-10-CM | POA: Diagnosis not present

## 2019-06-16 DIAGNOSIS — N183 Chronic kidney disease, stage 3 unspecified: Secondary | ICD-10-CM | POA: Diagnosis not present

## 2019-06-17 LAB — CULTURE, BLOOD (ROUTINE X 2)
Culture: NO GROWTH
Culture: NO GROWTH

## 2019-06-17 LAB — GI PATHOGEN PANEL BY PCR, STOOL

## 2019-06-18 ENCOUNTER — Ambulatory Visit: Payer: PRIVATE HEALTH INSURANCE | Admitting: Internal Medicine

## 2019-06-19 ENCOUNTER — Ambulatory Visit (INDEPENDENT_AMBULATORY_CARE_PROVIDER_SITE_OTHER): Payer: Medicare Other | Admitting: Internal Medicine

## 2019-06-19 ENCOUNTER — Encounter: Payer: Self-pay | Admitting: Internal Medicine

## 2019-06-19 ENCOUNTER — Other Ambulatory Visit: Payer: Self-pay

## 2019-06-19 DIAGNOSIS — I10 Essential (primary) hypertension: Secondary | ICD-10-CM | POA: Diagnosis not present

## 2019-06-19 DIAGNOSIS — R262 Difficulty in walking, not elsewhere classified: Secondary | ICD-10-CM | POA: Diagnosis not present

## 2019-06-19 DIAGNOSIS — M6281 Muscle weakness (generalized): Secondary | ICD-10-CM | POA: Diagnosis not present

## 2019-06-19 DIAGNOSIS — A0811 Acute gastroenteropathy due to Norwalk agent: Secondary | ICD-10-CM

## 2019-06-19 DIAGNOSIS — E119 Type 2 diabetes mellitus without complications: Secondary | ICD-10-CM | POA: Diagnosis not present

## 2019-06-19 NOTE — Progress Notes (Signed)
Mr.   [facility] for Infectious Disease  Reason for Consult: MSSA and enterococcal bacteremia complicating right diabetic foot infection Referring Provider: Dr. Barton Dubois  Assessment: He had transient MSSA and enterococcal bacteremia, most likely from the callused and slightly ulcerated area on his right foot.  Fortunately there was no evidence of deep right foot infection or prosthetic valve endocarditis.  I am quite hopeful that he has been cured following 4 weeks of IV piperacillin tazobactam.  Plan: 1. Discontinue piperacillin tazobactam and have PICC removed 2. Continue optimal glycemic control and right foot wound care.  From my standpoint he is free to shower.  I recommend liberal use of moisturizing creams on right foot. 3. Follow-up here in 4 to 6 weeks  Patient Active Problem List   Diagnosis Date Noted  . Enteritis due to Norovirus 06/19/2019    Priority: High  . Bacteremia due to methicillin susceptible Staphylococcus aureus (MSSA) 05/23/2019    Priority: High  . Enterococcal bacteremia 05/23/2019    Priority: High  . S/P TAVR (transcatheter aortic valve replacement) 05/23/2019    Priority: Medium  . S/P transmetatarsal amputation of foot, right ([place]) 05/23/2019    Priority: Medium  . Diabetic foot ulcer ([place]) 05/23/2019    Priority: Medium  . Hypokalemia 06/13/2019  . Diarrhea 06/12/2019  . Sepsis ([place]) 05/23/2019  . DKA (diabetic ketoacidoses) ([place]) 05/23/2019  . CKD (chronic kidney disease) stage 3, GFR 30-59 ml/min 05/23/2019  . CAD (coronary artery disease) 05/23/2019  . S/P CABG (coronary artery bypass graft) 05/23/2019  . PAD (peripheral artery disease) ([place]) 05/23/2019  . Dyslipidemia 05/23/2019  . Hypothyroid 05/23/2019  . Normocytic anemia 05/23/2019  . Thrombocytopenia ([place]) 05/23/2019  . Hypertension   . Diabetes mellitus ([place])     Patient's Medications  New Prescriptions   No medications on file  Previous Medications    AMLODIPINE (NORVASC) 10 MG TABLET    Take 10 mg by mouth daily.   ASCORBIC ACID (VITAMIN C) 1000 MG TABLET    Take 1,000 mg by mouth daily.   ASPIRIN 81 MG TABLET    Take 81 mg by mouth daily.    CETIRIZINE (ZYRTEC) 10 MG TABLET    Take 10 mg by mouth daily as needed for allergies.   CHOLECALCIFEROL (VITAMIN D3) 2000 UNITS TABS    Take 1 tablet by mouth daily.    CLONAZEPAM (KLONOPIN) 0.5 MG TABLET    Take 1 tablet (0.5 mg total) by mouth at bedtime.   CLOPIDOGREL (PLAVIX) 75 MG TABLET    Take 75 mg by mouth daily.   COENZYME Q10 100 MG TABS    Take 1 tablet by mouth daily.    ESCITALOPRAM (LEXAPRO) 10 MG TABLET    Take 10 mg by mouth daily.    FENOFIBRATE 160 MG TABLET    Take 160 mg by mouth daily.   GABAPENTIN (NEURONTIN) 600 MG TABLET    Take 1,200 mg by mouth 3 (three) times daily.    INSULIN GLARGINE (LANTUS) 100 UNIT/ML INJECTION    Inject 30 Units into the skin 2 (two) times daily.    ISOSORBIDE MONONITRATE (IMDUR) 30 MG 24 HR TABLET    Take 30 mg by mouth daily.   KETOCONAZOLE (NIZORAL) 2 % CREAM    Apply 1 application topically daily as needed for irritation.    LEVOTHYROXINE (SYNTHROID, LEVOTHROID) 50 MCG TABLET    Take 50 mcg by mouth daily before breakfast.   LOSARTAN (COZAAR) 100  MG TABLET    Take 100 mg by mouth daily.   METOCLOPRAMIDE (REGLAN) 10 MG TABLET    Take 10 mg by mouth every 8 (eight) hours as needed for nausea or vomiting.    MULTIPLE VITAMIN (MULTIVITAMIN) CAPSULE    Take 1 capsule by mouth daily. Adults 50+   NITROSTAT 0.4 MG SL TABLET    Place 0.4 mg under the tongue every 5 (five) minutes as needed for chest pain.    NOVOLIN R RELION 100 UNIT/ML INJECTION    Inject 20-50 Units into the skin 3 (three) times daily before meals. Per sliding scale as directed per dietician   OMEGA-3 ACID ETHYL ESTERS (LOVAZA) 1 G CAPSULE    Take 2 g by mouth 2 (two) times daily.   PANTOPRAZOLE (PROTONIX) 40 MG TABLET    Take 40 mg by mouth daily.   PRAVASTATIN (PRAVACHOL) 40 MG TABLET     Take 40 mg by mouth daily.   PSYLLIUM (HYDROCIL/METAMUCIL) 95 % PACK    Take 1 packet by mouth 2 (two) times daily.  Modified Medications   No medications on file  Discontinued Medications   No medications on file    HPI: Larry Hart is a 74 y.o. male with a history of diabetes, peripheral neuropathy and previous right transmetatarsal amputation in September 2019.  He recently developed a new callus with ulceration on the plantar surface of his right foot.  He has been followed closely by his podiatrist.  In early February he had sudden redness around the callus and was admitted to the hospital on 05/22/2019.  Admission blood cultures grew MSSA and Enterococcus.  A swab culture was done wound drainage from his foot and it grew MSSA and Serratia.  He underwent aortic valve replacement last year.  Repeat blood cultures were negative.  There was no evidence of endocarditis by TEE.  MRI of his right foot did not reveal any evidence of deep abscess or osteomyelitis.  He improved and was discharged on IV piperacillin tazobactam.  He has now completed 4 weeks of IV antibiotic therapy.  He was readmitted to the hospital on 06/12/2019 with nausea, vomiting and diarrhea.  Initially there was concern that that he had antibiotic associated colitis.  His C. difficile antigen was positive but the toxin was negative.  Following discharge his GI panel showed that he was positive for norovirus.  His wife and several other close friends had a similar illness.  He is feeling much better now.  Review of Systems: Review of Systems  Constitutional: Negative for chills, diaphoresis and fever.  Respiratory: Negative for cough and shortness of breath.   Cardiovascular: Negative for chest pain.  Gastrointestinal: Negative for abdominal pain, diarrhea, nausea and vomiting.  Musculoskeletal: Negative for joint pain.  Skin: Negative for rash.      Past Medical History:  Diagnosis Date  . Cancer ([place])    skin  .  Colon polyp    Status post hemicolectomy  . Diabetes mellitus ([place])   . Dyslipidemia   . Dyspnea on exertion   . Hypertension   . Low serum triglycerides   . Low testosterone   . Osteomyelitis ([place])    Status post amputation  . Peripheral vascular disease ([place])   . Sinus bradycardia   . Thyroid disorder    low    Social History   Tobacco Use  . Smoking status: Never Smoker  . Smokeless tobacco: Never Used  Substance Use Topics  . Alcohol  use: No    Alcohol/week: 0.0 standard drinks  . Drug use: No    Family History  Problem Relation Age of Onset  . Coronary artery disease Father 54  . Hypertension Mother   . Stroke Mother   . Diabetes Other    Allergies  Allergen Reactions  . Honey Bee Treatment [Bee Venom]   . Other Palpitations and Rash    Histalet, tears up nerves,possible rash    OBJECTIVE: Vitals:   06/19/19 1347  BP: (!) 144/69  Pulse: 79  Temp: 97.6 F (36.4 C)  TempSrc: Oral  Weight: 205 lb (93 kg)   Body mass index is 30.27 kg/m.   Physical Exam Constitutional:      Comments: Is very pleasant and in good spirits.  He is accompanied by his wife.  Cardiovascular:     Rate and Rhythm: Normal rate and regular rhythm.     Heart sounds: Murmur present.     Comments: He has crisp valve sounds with a 2/6 early systolic murmur heard best at the right upper sternal border. Pulmonary:     Effort: Pulmonary effort is normal.     Breath sounds: Normal breath sounds.  Abdominal:     Palpations: Abdomen is soft.     Tenderness: There is no abdominal tenderness.  Musculoskeletal:     Comments: He has previous right transmetatarsal amputation.  He has 2 callused areas on the plantar surface of his right foot without ulceration.  He is now cellulitis, fluctuance or drainage.  Skin:    Comments: Right arm PIC site looks good.  Psychiatric:        Mood and Affect: Mood normal.       Microbiology: Recent Results (from the past 240 hour(s))   Culture, blood (routine x 2)     Status: None   Collection Time: 06/12/19 11:52 AM   Specimen: BLOOD  Result Value Ref Range Status   Specimen Description BLOOD  Final   Special Requests NONE  Final   Culture   Final    NO GROWTH 5 DAYS Performed at [facility], [address], [place], [place] [address]    Report Status 06/17/2019 FINAL  Final  Culture, blood (routine x 2)     Status: None   Collection Time: 06/12/19 11:52 AM   Specimen: BLOOD  Result Value Ref Range Status   Specimen Description BLOOD  Final   Special Requests NONE  Final   Culture   Final    NO GROWTH 5 DAYS Performed at [facility], [address] [place], [place] [address]    Report Status 06/17/2019 FINAL  Final  SARS CORONAVIRUS 2 (TAT 6-24 HRS) Nasopharyngeal Nasopharyngeal Swab     Status: None   Collection Time: 06/12/19 12:17 PM   Specimen: Nasopharyngeal Swab  Result Value Ref Range Status   SARS Coronavirus 2 NEGATIVE NEGATIVE Final    Comment: (NOTE) SARS-CoV-2 target nucleic acids are NOT DETECTED. The SARS-CoV-2 RNA is generally detectable in upper and lower respiratory specimens during the acute phase of infection. Negative results do not preclude SARS-CoV-2 infection, do not rule out co-infections with other pathogens, and should not be used as the sole basis for treatment or other patient management decisions. Negative results must be combined with clinical observations, patient history, and epidemiological information. The expected result is Negative. Fact Sheet for Patients: SugarRoll.be Fact Sheet for Healthcare Providers: https://www.woods-mathews.com/ This test is not yet approved or cleared by the [place] FDA and  has been authorized for detection and/or diagnosis of SARS-CoV-2 by FDA under an Emergency Use Authorization (EUA). This EUA will remain  in effect (meaning this test can be used) for the duration of the COVID-19  declaration under Section 56 4(b)(1) of the Act, 21 U.S.C. section 360bbb-3(b)(1), unless the authorization is terminated or revoked sooner. Performed at [facility], [place] [address], [place], [place] [address]   GI pathogen panel by PCR, stool     Status: Abnormal   Collection Time: 06/12/19  3:08 PM   Specimen: STOOL  Result Value Ref Range Status   Plesiomonas shigelloides NOT DETECTED NOT DETECTED Final   Yersinia enterocolitica NOT DETECTED NOT DETECTED Final   Vibrio NOT DETECTED NOT DETECTED Final   Enteropathogenic E coli NOT DETECTED NOT DETECTED Final   E coli (ETEC) LT/ST NOT DETECTED NOT DETECTED Final   E coli 9480 by PCR Not applicable NOT DETECTED Final   Cryptosporidium by PCR NOT DETECTED NOT DETECTED Final   Entamoeba histolytica NOT DETECTED NOT DETECTED Final   Adenovirus F 40/41 NOT DETECTED NOT DETECTED Final   Norovirus GI/GII DETECTED (A) NOT DETECTED Final   Sapovirus NOT DETECTED NOT DETECTED Final    Comment: (NOTE) Performed At: [facility] [place], [place] [id number] Rush Farmer MD LM:[id number]    Vibrio cholerae NOT DETECTED NOT DETECTED Final   Campylobacter by PCR NOT DETECTED NOT DETECTED Final   Salmonella by PCR NOT DETECTED NOT DETECTED Final   E coli (STEC) NOT DETECTED NOT DETECTED Final   Enteroaggregative E coli NOT DETECTED NOT DETECTED Final   Shigella by PCR NOT DETECTED NOT DETECTED Final   Cyclospora cayetanensis NOT DETECTED NOT DETECTED Final   Astrovirus NOT DETECTED NOT DETECTED Final   G lamblia by PCR NOT DETECTED NOT DETECTED Final   Rotavirus A by PCR NOT DETECTED NOT DETECTED Final  C Difficile Quick Screen w PCR reflex     Status: Abnormal   Collection Time: 06/12/19  3:08 PM   Specimen: STOOL  Result Value Ref Range Status   C Diff antigen POSITIVE (A) NEGATIVE Final   C Diff toxin NEGATIVE NEGATIVE Final   C Diff interpretation Results are indeterminate. See PCR results.  Final     Comment: Performed at [facility], [address], [place], [place] [address]  C. Diff by PCR, Reflexed     Status: None   Collection Time: 06/12/19  3:08 PM  Result Value Ref Range Status   Toxigenic C. Difficile by PCR NEGATIVE NEGATIVE Final    Comment: Patient is colonized with non toxigenic C. difficile. May not need treatment unless significant symptoms are present. Performed at [facility], [place] [address] [place], [place] [address]     Michel Bickers, [place] [facility] [place] [facility] [phone] pager   [phone] cell 06/19/2019, 2:13 PM

## 2019-06-19 NOTE — Progress Notes (Addendum)
Per Dr Megan Salon  41 cm  Single lumen Peripherally Inserted Central Catheter  removed from right basilic . No sutures present. Dressing was clean and dry . Area cleansed with chlorhexidine and petroleum dressing applied. Pt advised no heavy lifting with this arm, leave dressing for 24 hours and call the office if dressing becomes soaked with blood or sharp pain presents.  Pt tolerated procedure well.    Laverle Patter, RN

## 2019-06-21 DIAGNOSIS — B9561 Methicillin susceptible Staphylococcus aureus infection as the cause of diseases classified elsewhere: Secondary | ICD-10-CM | POA: Diagnosis not present

## 2019-06-21 DIAGNOSIS — I739 Peripheral vascular disease, unspecified: Secondary | ICD-10-CM | POA: Diagnosis not present

## 2019-06-21 DIAGNOSIS — E11621 Type 2 diabetes mellitus with foot ulcer: Secondary | ICD-10-CM | POA: Diagnosis not present

## 2019-06-22 DIAGNOSIS — E11621 Type 2 diabetes mellitus with foot ulcer: Secondary | ICD-10-CM | POA: Diagnosis not present

## 2019-06-22 DIAGNOSIS — I739 Peripheral vascular disease, unspecified: Secondary | ICD-10-CM | POA: Diagnosis not present

## 2019-06-22 DIAGNOSIS — L97519 Non-pressure chronic ulcer of other part of right foot with unspecified severity: Secondary | ICD-10-CM | POA: Diagnosis not present

## 2019-06-22 DIAGNOSIS — N183 Chronic kidney disease, stage 3 unspecified: Secondary | ICD-10-CM | POA: Diagnosis not present

## 2019-06-22 DIAGNOSIS — Z89431 Acquired absence of right foot: Secondary | ICD-10-CM | POA: Diagnosis not present

## 2019-06-22 DIAGNOSIS — I10 Essential (primary) hypertension: Secondary | ICD-10-CM | POA: Diagnosis not present

## 2019-06-25 DIAGNOSIS — I10 Essential (primary) hypertension: Secondary | ICD-10-CM | POA: Diagnosis not present

## 2019-06-25 DIAGNOSIS — E11621 Type 2 diabetes mellitus with foot ulcer: Secondary | ICD-10-CM | POA: Diagnosis not present

## 2019-06-25 DIAGNOSIS — K219 Gastro-esophageal reflux disease without esophagitis: Secondary | ICD-10-CM | POA: Diagnosis not present

## 2019-06-29 DIAGNOSIS — E1110 Type 2 diabetes mellitus with ketoacidosis without coma: Secondary | ICD-10-CM | POA: Diagnosis not present

## 2019-06-29 DIAGNOSIS — E1122 Type 2 diabetes mellitus with diabetic chronic kidney disease: Secondary | ICD-10-CM | POA: Diagnosis not present

## 2019-06-29 DIAGNOSIS — A419 Sepsis, unspecified organism: Secondary | ICD-10-CM | POA: Diagnosis not present

## 2019-06-29 DIAGNOSIS — J811 Chronic pulmonary edema: Secondary | ICD-10-CM | POA: Diagnosis not present

## 2019-06-29 DIAGNOSIS — N179 Acute kidney failure, unspecified: Secondary | ICD-10-CM | POA: Diagnosis not present

## 2019-07-03 DIAGNOSIS — K219 Gastro-esophageal reflux disease without esophagitis: Secondary | ICD-10-CM | POA: Diagnosis not present

## 2019-07-03 DIAGNOSIS — R411 Anterograde amnesia: Secondary | ICD-10-CM | POA: Diagnosis not present

## 2019-07-03 DIAGNOSIS — E11621 Type 2 diabetes mellitus with foot ulcer: Secondary | ICD-10-CM | POA: Diagnosis not present

## 2019-07-06 DIAGNOSIS — F329 Major depressive disorder, single episode, unspecified: Secondary | ICD-10-CM | POA: Diagnosis not present

## 2019-07-06 DIAGNOSIS — Z89431 Acquired absence of right foot: Secondary | ICD-10-CM | POA: Diagnosis not present

## 2019-07-06 DIAGNOSIS — E876 Hypokalemia: Secondary | ICD-10-CM | POA: Diagnosis not present

## 2019-07-06 DIAGNOSIS — N183 Chronic kidney disease, stage 3 unspecified: Secondary | ICD-10-CM | POA: Diagnosis not present

## 2019-07-06 DIAGNOSIS — E1169 Type 2 diabetes mellitus with other specified complication: Secondary | ICD-10-CM | POA: Diagnosis not present

## 2019-07-06 DIAGNOSIS — I129 Hypertensive chronic kidney disease with stage 1 through stage 4 chronic kidney disease, or unspecified chronic kidney disease: Secondary | ICD-10-CM | POA: Diagnosis not present

## 2019-07-06 DIAGNOSIS — Z7982 Long term (current) use of aspirin: Secondary | ICD-10-CM | POA: Diagnosis not present

## 2019-07-06 DIAGNOSIS — E1151 Type 2 diabetes mellitus with diabetic peripheral angiopathy without gangrene: Secondary | ICD-10-CM | POA: Diagnosis not present

## 2019-07-06 DIAGNOSIS — E1122 Type 2 diabetes mellitus with diabetic chronic kidney disease: Secondary | ICD-10-CM | POA: Diagnosis not present

## 2019-07-06 DIAGNOSIS — F419 Anxiety disorder, unspecified: Secondary | ICD-10-CM | POA: Diagnosis not present

## 2019-07-06 DIAGNOSIS — Z794 Long term (current) use of insulin: Secondary | ICD-10-CM | POA: Diagnosis not present

## 2019-07-06 DIAGNOSIS — E1140 Type 2 diabetes mellitus with diabetic neuropathy, unspecified: Secondary | ICD-10-CM | POA: Diagnosis not present

## 2019-07-06 DIAGNOSIS — Z89422 Acquired absence of other left toe(s): Secondary | ICD-10-CM | POA: Diagnosis not present

## 2019-07-06 DIAGNOSIS — E039 Hypothyroidism, unspecified: Secondary | ICD-10-CM | POA: Diagnosis not present

## 2019-07-06 DIAGNOSIS — Z8631 Personal history of diabetic foot ulcer: Secondary | ICD-10-CM | POA: Diagnosis not present

## 2019-07-06 DIAGNOSIS — Z8614 Personal history of Methicillin resistant Staphylococcus aureus infection: Secondary | ICD-10-CM | POA: Diagnosis not present

## 2019-07-06 DIAGNOSIS — M6281 Muscle weakness (generalized): Secondary | ICD-10-CM | POA: Diagnosis not present

## 2019-07-06 DIAGNOSIS — K219 Gastro-esophageal reflux disease without esophagitis: Secondary | ICD-10-CM | POA: Diagnosis not present

## 2019-07-09 DIAGNOSIS — Z8614 Personal history of Methicillin resistant Staphylococcus aureus infection: Secondary | ICD-10-CM | POA: Diagnosis not present

## 2019-07-09 DIAGNOSIS — E1151 Type 2 diabetes mellitus with diabetic peripheral angiopathy without gangrene: Secondary | ICD-10-CM | POA: Diagnosis not present

## 2019-07-09 DIAGNOSIS — E876 Hypokalemia: Secondary | ICD-10-CM | POA: Diagnosis not present

## 2019-07-09 DIAGNOSIS — E1122 Type 2 diabetes mellitus with diabetic chronic kidney disease: Secondary | ICD-10-CM | POA: Diagnosis not present

## 2019-07-09 DIAGNOSIS — E1169 Type 2 diabetes mellitus with other specified complication: Secondary | ICD-10-CM | POA: Diagnosis not present

## 2019-07-09 DIAGNOSIS — I129 Hypertensive chronic kidney disease with stage 1 through stage 4 chronic kidney disease, or unspecified chronic kidney disease: Secondary | ICD-10-CM | POA: Diagnosis not present

## 2019-07-10 DIAGNOSIS — I10 Essential (primary) hypertension: Secondary | ICD-10-CM | POA: Diagnosis not present

## 2019-07-10 DIAGNOSIS — E7849 Other hyperlipidemia: Secondary | ICD-10-CM | POA: Diagnosis not present

## 2019-07-12 DIAGNOSIS — E1151 Type 2 diabetes mellitus with diabetic peripheral angiopathy without gangrene: Secondary | ICD-10-CM | POA: Diagnosis not present

## 2019-07-12 DIAGNOSIS — Z8614 Personal history of Methicillin resistant Staphylococcus aureus infection: Secondary | ICD-10-CM | POA: Diagnosis not present

## 2019-07-12 DIAGNOSIS — E1122 Type 2 diabetes mellitus with diabetic chronic kidney disease: Secondary | ICD-10-CM | POA: Diagnosis not present

## 2019-07-12 DIAGNOSIS — E1169 Type 2 diabetes mellitus with other specified complication: Secondary | ICD-10-CM | POA: Diagnosis not present

## 2019-07-12 DIAGNOSIS — E876 Hypokalemia: Secondary | ICD-10-CM | POA: Diagnosis not present

## 2019-07-12 DIAGNOSIS — I129 Hypertensive chronic kidney disease with stage 1 through stage 4 chronic kidney disease, or unspecified chronic kidney disease: Secondary | ICD-10-CM | POA: Diagnosis not present

## 2019-07-13 DIAGNOSIS — E1122 Type 2 diabetes mellitus with diabetic chronic kidney disease: Secondary | ICD-10-CM | POA: Diagnosis not present

## 2019-07-13 DIAGNOSIS — Z8614 Personal history of Methicillin resistant Staphylococcus aureus infection: Secondary | ICD-10-CM | POA: Diagnosis not present

## 2019-07-13 DIAGNOSIS — I129 Hypertensive chronic kidney disease with stage 1 through stage 4 chronic kidney disease, or unspecified chronic kidney disease: Secondary | ICD-10-CM | POA: Diagnosis not present

## 2019-07-13 DIAGNOSIS — E1151 Type 2 diabetes mellitus with diabetic peripheral angiopathy without gangrene: Secondary | ICD-10-CM | POA: Diagnosis not present

## 2019-07-13 DIAGNOSIS — E1169 Type 2 diabetes mellitus with other specified complication: Secondary | ICD-10-CM | POA: Diagnosis not present

## 2019-07-13 DIAGNOSIS — E876 Hypokalemia: Secondary | ICD-10-CM | POA: Diagnosis not present

## 2019-07-16 DIAGNOSIS — I129 Hypertensive chronic kidney disease with stage 1 through stage 4 chronic kidney disease, or unspecified chronic kidney disease: Secondary | ICD-10-CM | POA: Diagnosis not present

## 2019-07-16 DIAGNOSIS — I10 Essential (primary) hypertension: Secondary | ICD-10-CM | POA: Diagnosis not present

## 2019-07-16 DIAGNOSIS — E1151 Type 2 diabetes mellitus with diabetic peripheral angiopathy without gangrene: Secondary | ICD-10-CM | POA: Diagnosis not present

## 2019-07-16 DIAGNOSIS — L97519 Non-pressure chronic ulcer of other part of right foot with unspecified severity: Secondary | ICD-10-CM | POA: Diagnosis not present

## 2019-07-16 DIAGNOSIS — E876 Hypokalemia: Secondary | ICD-10-CM | POA: Diagnosis not present

## 2019-07-16 DIAGNOSIS — E11621 Type 2 diabetes mellitus with foot ulcer: Secondary | ICD-10-CM | POA: Diagnosis not present

## 2019-07-16 DIAGNOSIS — Z8614 Personal history of Methicillin resistant Staphylococcus aureus infection: Secondary | ICD-10-CM | POA: Diagnosis not present

## 2019-07-16 DIAGNOSIS — E1169 Type 2 diabetes mellitus with other specified complication: Secondary | ICD-10-CM | POA: Diagnosis not present

## 2019-07-16 DIAGNOSIS — Z89431 Acquired absence of right foot: Secondary | ICD-10-CM | POA: Diagnosis not present

## 2019-07-16 DIAGNOSIS — I739 Peripheral vascular disease, unspecified: Secondary | ICD-10-CM | POA: Diagnosis not present

## 2019-07-16 DIAGNOSIS — L97413 Non-pressure chronic ulcer of right heel and midfoot with necrosis of muscle: Secondary | ICD-10-CM | POA: Diagnosis not present

## 2019-07-16 DIAGNOSIS — E1122 Type 2 diabetes mellitus with diabetic chronic kidney disease: Secondary | ICD-10-CM | POA: Diagnosis not present

## 2019-07-17 DIAGNOSIS — E1122 Type 2 diabetes mellitus with diabetic chronic kidney disease: Secondary | ICD-10-CM | POA: Diagnosis not present

## 2019-07-17 DIAGNOSIS — E1151 Type 2 diabetes mellitus with diabetic peripheral angiopathy without gangrene: Secondary | ICD-10-CM | POA: Diagnosis not present

## 2019-07-17 DIAGNOSIS — Z8614 Personal history of Methicillin resistant Staphylococcus aureus infection: Secondary | ICD-10-CM | POA: Diagnosis not present

## 2019-07-17 DIAGNOSIS — E876 Hypokalemia: Secondary | ICD-10-CM | POA: Diagnosis not present

## 2019-07-17 DIAGNOSIS — I129 Hypertensive chronic kidney disease with stage 1 through stage 4 chronic kidney disease, or unspecified chronic kidney disease: Secondary | ICD-10-CM | POA: Diagnosis not present

## 2019-07-17 DIAGNOSIS — E1169 Type 2 diabetes mellitus with other specified complication: Secondary | ICD-10-CM | POA: Diagnosis not present

## 2019-07-18 DIAGNOSIS — M79672 Pain in left foot: Secondary | ICD-10-CM | POA: Diagnosis not present

## 2019-07-18 DIAGNOSIS — L110 Acquired keratosis follicularis: Secondary | ICD-10-CM | POA: Diagnosis not present

## 2019-07-18 DIAGNOSIS — I739 Peripheral vascular disease, unspecified: Secondary | ICD-10-CM | POA: Diagnosis not present

## 2019-07-18 DIAGNOSIS — E1140 Type 2 diabetes mellitus with diabetic neuropathy, unspecified: Secondary | ICD-10-CM | POA: Diagnosis not present

## 2019-07-18 DIAGNOSIS — M79671 Pain in right foot: Secondary | ICD-10-CM | POA: Diagnosis not present

## 2019-07-20 DIAGNOSIS — Z8614 Personal history of Methicillin resistant Staphylococcus aureus infection: Secondary | ICD-10-CM | POA: Diagnosis not present

## 2019-07-20 DIAGNOSIS — E1151 Type 2 diabetes mellitus with diabetic peripheral angiopathy without gangrene: Secondary | ICD-10-CM | POA: Diagnosis not present

## 2019-07-20 DIAGNOSIS — E1122 Type 2 diabetes mellitus with diabetic chronic kidney disease: Secondary | ICD-10-CM | POA: Diagnosis not present

## 2019-07-20 DIAGNOSIS — I129 Hypertensive chronic kidney disease with stage 1 through stage 4 chronic kidney disease, or unspecified chronic kidney disease: Secondary | ICD-10-CM | POA: Diagnosis not present

## 2019-07-20 DIAGNOSIS — E1169 Type 2 diabetes mellitus with other specified complication: Secondary | ICD-10-CM | POA: Diagnosis not present

## 2019-07-20 DIAGNOSIS — E876 Hypokalemia: Secondary | ICD-10-CM | POA: Diagnosis not present

## 2019-07-23 DIAGNOSIS — E876 Hypokalemia: Secondary | ICD-10-CM | POA: Diagnosis not present

## 2019-07-23 DIAGNOSIS — E1122 Type 2 diabetes mellitus with diabetic chronic kidney disease: Secondary | ICD-10-CM | POA: Diagnosis not present

## 2019-07-23 DIAGNOSIS — E1169 Type 2 diabetes mellitus with other specified complication: Secondary | ICD-10-CM | POA: Diagnosis not present

## 2019-07-23 DIAGNOSIS — E1151 Type 2 diabetes mellitus with diabetic peripheral angiopathy without gangrene: Secondary | ICD-10-CM | POA: Diagnosis not present

## 2019-07-23 DIAGNOSIS — I129 Hypertensive chronic kidney disease with stage 1 through stage 4 chronic kidney disease, or unspecified chronic kidney disease: Secondary | ICD-10-CM | POA: Diagnosis not present

## 2019-07-23 DIAGNOSIS — Z8614 Personal history of Methicillin resistant Staphylococcus aureus infection: Secondary | ICD-10-CM | POA: Diagnosis not present

## 2019-07-24 DIAGNOSIS — E1140 Type 2 diabetes mellitus with diabetic neuropathy, unspecified: Secondary | ICD-10-CM | POA: Diagnosis not present

## 2019-07-24 DIAGNOSIS — E559 Vitamin D deficiency, unspecified: Secondary | ICD-10-CM | POA: Diagnosis not present

## 2019-07-24 DIAGNOSIS — E782 Mixed hyperlipidemia: Secondary | ICD-10-CM | POA: Diagnosis not present

## 2019-07-24 DIAGNOSIS — D510 Vitamin B12 deficiency anemia due to intrinsic factor deficiency: Secondary | ICD-10-CM | POA: Diagnosis not present

## 2019-07-24 DIAGNOSIS — E039 Hypothyroidism, unspecified: Secondary | ICD-10-CM | POA: Diagnosis not present

## 2019-07-25 DIAGNOSIS — I129 Hypertensive chronic kidney disease with stage 1 through stage 4 chronic kidney disease, or unspecified chronic kidney disease: Secondary | ICD-10-CM | POA: Diagnosis not present

## 2019-07-25 DIAGNOSIS — E1122 Type 2 diabetes mellitus with diabetic chronic kidney disease: Secondary | ICD-10-CM | POA: Diagnosis not present

## 2019-07-25 DIAGNOSIS — Z8614 Personal history of Methicillin resistant Staphylococcus aureus infection: Secondary | ICD-10-CM | POA: Diagnosis not present

## 2019-07-25 DIAGNOSIS — E1151 Type 2 diabetes mellitus with diabetic peripheral angiopathy without gangrene: Secondary | ICD-10-CM | POA: Diagnosis not present

## 2019-07-25 DIAGNOSIS — E1169 Type 2 diabetes mellitus with other specified complication: Secondary | ICD-10-CM | POA: Diagnosis not present

## 2019-07-25 DIAGNOSIS — E876 Hypokalemia: Secondary | ICD-10-CM | POA: Diagnosis not present

## 2019-07-31 DIAGNOSIS — Z8614 Personal history of Methicillin resistant Staphylococcus aureus infection: Secondary | ICD-10-CM | POA: Diagnosis not present

## 2019-07-31 DIAGNOSIS — E1122 Type 2 diabetes mellitus with diabetic chronic kidney disease: Secondary | ICD-10-CM | POA: Diagnosis not present

## 2019-07-31 DIAGNOSIS — E1151 Type 2 diabetes mellitus with diabetic peripheral angiopathy without gangrene: Secondary | ICD-10-CM | POA: Diagnosis not present

## 2019-07-31 DIAGNOSIS — E876 Hypokalemia: Secondary | ICD-10-CM | POA: Diagnosis not present

## 2019-07-31 DIAGNOSIS — I129 Hypertensive chronic kidney disease with stage 1 through stage 4 chronic kidney disease, or unspecified chronic kidney disease: Secondary | ICD-10-CM | POA: Diagnosis not present

## 2019-07-31 DIAGNOSIS — E1169 Type 2 diabetes mellitus with other specified complication: Secondary | ICD-10-CM | POA: Diagnosis not present

## 2019-08-01 DIAGNOSIS — I739 Peripheral vascular disease, unspecified: Secondary | ICD-10-CM | POA: Diagnosis not present

## 2019-08-01 DIAGNOSIS — Z89431 Acquired absence of right foot: Secondary | ICD-10-CM | POA: Diagnosis not present

## 2019-08-01 DIAGNOSIS — I10 Essential (primary) hypertension: Secondary | ICD-10-CM | POA: Diagnosis not present

## 2019-08-01 DIAGNOSIS — L97413 Non-pressure chronic ulcer of right heel and midfoot with necrosis of muscle: Secondary | ICD-10-CM | POA: Diagnosis not present

## 2019-08-01 DIAGNOSIS — N183 Chronic kidney disease, stage 3 unspecified: Secondary | ICD-10-CM | POA: Diagnosis not present

## 2019-08-03 DIAGNOSIS — J9811 Atelectasis: Secondary | ICD-10-CM | POA: Diagnosis not present

## 2019-08-03 DIAGNOSIS — Z794 Long term (current) use of insulin: Secondary | ICD-10-CM | POA: Diagnosis not present

## 2019-08-03 DIAGNOSIS — K8020 Calculus of gallbladder without cholecystitis without obstruction: Secondary | ICD-10-CM | POA: Diagnosis not present

## 2019-08-03 DIAGNOSIS — E785 Hyperlipidemia, unspecified: Secondary | ICD-10-CM | POA: Diagnosis not present

## 2019-08-03 DIAGNOSIS — E11621 Type 2 diabetes mellitus with foot ulcer: Secondary | ICD-10-CM | POA: Diagnosis not present

## 2019-08-03 DIAGNOSIS — N400 Enlarged prostate without lower urinary tract symptoms: Secondary | ICD-10-CM | POA: Diagnosis not present

## 2019-08-03 DIAGNOSIS — Z8719 Personal history of other diseases of the digestive system: Secondary | ICD-10-CM | POA: Diagnosis not present

## 2019-08-03 DIAGNOSIS — L97509 Non-pressure chronic ulcer of other part of unspecified foot with unspecified severity: Secondary | ICD-10-CM | POA: Diagnosis not present

## 2019-08-03 DIAGNOSIS — Z20822 Contact with and (suspected) exposure to covid-19: Secondary | ICD-10-CM | POA: Diagnosis not present

## 2019-08-03 DIAGNOSIS — I2510 Atherosclerotic heart disease of native coronary artery without angina pectoris: Secondary | ICD-10-CM | POA: Diagnosis not present

## 2019-08-03 DIAGNOSIS — I10 Essential (primary) hypertension: Secondary | ICD-10-CM | POA: Diagnosis not present

## 2019-08-03 DIAGNOSIS — K760 Fatty (change of) liver, not elsewhere classified: Secondary | ICD-10-CM | POA: Diagnosis not present

## 2019-08-03 DIAGNOSIS — L03115 Cellulitis of right lower limb: Secondary | ICD-10-CM | POA: Diagnosis not present

## 2019-08-03 DIAGNOSIS — R112 Nausea with vomiting, unspecified: Secondary | ICD-10-CM | POA: Diagnosis not present

## 2019-08-03 DIAGNOSIS — M79671 Pain in right foot: Secondary | ICD-10-CM | POA: Diagnosis not present

## 2019-08-03 DIAGNOSIS — Z955 Presence of coronary angioplasty implant and graft: Secondary | ICD-10-CM | POA: Diagnosis not present

## 2019-08-03 DIAGNOSIS — Z8709 Personal history of other diseases of the respiratory system: Secondary | ICD-10-CM | POA: Diagnosis not present

## 2019-08-03 DIAGNOSIS — L97519 Non-pressure chronic ulcer of other part of right foot with unspecified severity: Secondary | ICD-10-CM | POA: Diagnosis not present

## 2019-08-03 DIAGNOSIS — R918 Other nonspecific abnormal finding of lung field: Secondary | ICD-10-CM | POA: Diagnosis not present

## 2019-08-03 DIAGNOSIS — M7989 Other specified soft tissue disorders: Secondary | ICD-10-CM | POA: Diagnosis not present

## 2019-08-03 DIAGNOSIS — E13621 Other specified diabetes mellitus with foot ulcer: Secondary | ICD-10-CM | POA: Diagnosis not present

## 2019-08-03 DIAGNOSIS — R509 Fever, unspecified: Secondary | ICD-10-CM | POA: Diagnosis not present

## 2019-08-03 DIAGNOSIS — E1142 Type 2 diabetes mellitus with diabetic polyneuropathy: Secondary | ICD-10-CM | POA: Diagnosis not present

## 2019-08-03 DIAGNOSIS — N3289 Other specified disorders of bladder: Secondary | ICD-10-CM | POA: Diagnosis not present

## 2019-08-03 DIAGNOSIS — N390 Urinary tract infection, site not specified: Secondary | ICD-10-CM | POA: Diagnosis not present

## 2019-08-03 DIAGNOSIS — Z952 Presence of prosthetic heart valve: Secondary | ICD-10-CM | POA: Diagnosis not present

## 2019-08-03 DIAGNOSIS — E1151 Type 2 diabetes mellitus with diabetic peripheral angiopathy without gangrene: Secondary | ICD-10-CM | POA: Diagnosis not present

## 2019-08-04 DIAGNOSIS — L03115 Cellulitis of right lower limb: Secondary | ICD-10-CM | POA: Diagnosis not present

## 2019-08-04 DIAGNOSIS — M7661 Achilles tendinitis, right leg: Secondary | ICD-10-CM | POA: Diagnosis not present

## 2019-08-04 DIAGNOSIS — J9811 Atelectasis: Secondary | ICD-10-CM | POA: Diagnosis not present

## 2019-08-04 DIAGNOSIS — R509 Fever, unspecified: Secondary | ICD-10-CM | POA: Diagnosis not present

## 2019-08-04 DIAGNOSIS — I10 Essential (primary) hypertension: Secondary | ICD-10-CM | POA: Diagnosis not present

## 2019-08-04 DIAGNOSIS — Z20822 Contact with and (suspected) exposure to covid-19: Secondary | ICD-10-CM | POA: Diagnosis present

## 2019-08-04 DIAGNOSIS — E13621 Other specified diabetes mellitus with foot ulcer: Secondary | ICD-10-CM | POA: Diagnosis not present

## 2019-08-04 DIAGNOSIS — E11628 Type 2 diabetes mellitus with other skin complications: Secondary | ICD-10-CM | POA: Diagnosis present

## 2019-08-04 DIAGNOSIS — E1122 Type 2 diabetes mellitus with diabetic chronic kidney disease: Secondary | ICD-10-CM | POA: Diagnosis not present

## 2019-08-04 DIAGNOSIS — Z952 Presence of prosthetic heart valve: Secondary | ICD-10-CM | POA: Diagnosis not present

## 2019-08-04 DIAGNOSIS — L97509 Non-pressure chronic ulcer of other part of unspecified foot with unspecified severity: Secondary | ICD-10-CM | POA: Diagnosis not present

## 2019-08-04 DIAGNOSIS — G4733 Obstructive sleep apnea (adult) (pediatric): Secondary | ICD-10-CM | POA: Diagnosis present

## 2019-08-04 DIAGNOSIS — I350 Nonrheumatic aortic (valve) stenosis: Secondary | ICD-10-CM | POA: Diagnosis not present

## 2019-08-04 DIAGNOSIS — Z6830 Body mass index (BMI) 30.0-30.9, adult: Secondary | ICD-10-CM | POA: Diagnosis not present

## 2019-08-04 DIAGNOSIS — Z9889 Other specified postprocedural states: Secondary | ICD-10-CM | POA: Diagnosis not present

## 2019-08-04 DIAGNOSIS — L97519 Non-pressure chronic ulcer of other part of right foot with unspecified severity: Secondary | ICD-10-CM | POA: Diagnosis not present

## 2019-08-04 DIAGNOSIS — E1142 Type 2 diabetes mellitus with diabetic polyneuropathy: Secondary | ICD-10-CM | POA: Diagnosis present

## 2019-08-04 DIAGNOSIS — Z794 Long term (current) use of insulin: Secondary | ICD-10-CM | POA: Diagnosis not present

## 2019-08-04 DIAGNOSIS — R918 Other nonspecific abnormal finding of lung field: Secondary | ICD-10-CM | POA: Diagnosis not present

## 2019-08-04 DIAGNOSIS — M722 Plantar fascial fibromatosis: Secondary | ICD-10-CM | POA: Diagnosis not present

## 2019-08-04 DIAGNOSIS — L97513 Non-pressure chronic ulcer of other part of right foot with necrosis of muscle: Secondary | ICD-10-CM | POA: Diagnosis present

## 2019-08-04 DIAGNOSIS — Z951 Presence of aortocoronary bypass graft: Secondary | ICD-10-CM | POA: Diagnosis not present

## 2019-08-04 DIAGNOSIS — N390 Urinary tract infection, site not specified: Secondary | ICD-10-CM | POA: Diagnosis present

## 2019-08-04 DIAGNOSIS — E1169 Type 2 diabetes mellitus with other specified complication: Secondary | ICD-10-CM | POA: Diagnosis not present

## 2019-08-04 DIAGNOSIS — E1151 Type 2 diabetes mellitus with diabetic peripheral angiopathy without gangrene: Secondary | ICD-10-CM | POA: Diagnosis present

## 2019-08-04 DIAGNOSIS — E11621 Type 2 diabetes mellitus with foot ulcer: Secondary | ICD-10-CM | POA: Diagnosis present

## 2019-08-04 DIAGNOSIS — N184 Chronic kidney disease, stage 4 (severe): Secondary | ICD-10-CM | POA: Diagnosis not present

## 2019-08-04 DIAGNOSIS — N183 Chronic kidney disease, stage 3 unspecified: Secondary | ICD-10-CM | POA: Diagnosis present

## 2019-08-04 DIAGNOSIS — I2510 Atherosclerotic heart disease of native coronary artery without angina pectoris: Secondary | ICD-10-CM | POA: Diagnosis present

## 2019-08-04 DIAGNOSIS — I129 Hypertensive chronic kidney disease with stage 1 through stage 4 chronic kidney disease, or unspecified chronic kidney disease: Secondary | ICD-10-CM | POA: Diagnosis present

## 2019-08-06 ENCOUNTER — Ambulatory Visit: Payer: PRIVATE HEALTH INSURANCE | Admitting: Internal Medicine

## 2019-08-06 MED ORDER — INSULIN LISPRO 100 UNIT/ML ~~LOC~~ SOLN
5.00 | SUBCUTANEOUS | Status: DC
Start: 2019-08-06 — End: 2019-08-06

## 2019-08-06 MED ORDER — ASCORBIC ACID 500 MG PO TABS
1000.00 | ORAL_TABLET | ORAL | Status: DC
Start: 2019-08-07 — End: 2019-08-06

## 2019-08-06 MED ORDER — LOSARTAN POTASSIUM 100 MG PO TABS
100.00 | ORAL_TABLET | ORAL | Status: DC
Start: 2019-08-07 — End: 2019-08-06

## 2019-08-06 MED ORDER — LORATADINE 10 MG PO TABS
10.00 | ORAL_TABLET | ORAL | Status: DC
Start: 2019-08-07 — End: 2019-08-06

## 2019-08-06 MED ORDER — GENERIC EXTERNAL MEDICATION
Status: DC
Start: ? — End: 2019-08-06

## 2019-08-06 MED ORDER — CHOLECALCIFEROL 25 MCG (1000 UT) PO TABS
5000.00 | ORAL_TABLET | ORAL | Status: DC
Start: 2019-08-07 — End: 2019-08-06

## 2019-08-06 MED ORDER — LEVOTHYROXINE SODIUM 50 MCG PO TABS
50.00 | ORAL_TABLET | ORAL | Status: DC
Start: 2019-08-07 — End: 2019-08-06

## 2019-08-06 MED ORDER — PRAVASTATIN SODIUM 40 MG PO TABS
40.00 | ORAL_TABLET | ORAL | Status: DC
Start: 2019-08-06 — End: 2019-08-06

## 2019-08-06 MED ORDER — AMLODIPINE BESYLATE 10 MG PO TABS
10.00 | ORAL_TABLET | ORAL | Status: DC
Start: 2019-08-07 — End: 2019-08-06

## 2019-08-06 MED ORDER — INSULIN GLARGINE 100 UNIT/ML ~~LOC~~ SOLN
30.00 | SUBCUTANEOUS | Status: DC
Start: 2019-08-06 — End: 2019-08-06

## 2019-08-06 MED ORDER — CLOPIDOGREL BISULFATE 75 MG PO TABS
75.00 | ORAL_TABLET | ORAL | Status: DC
Start: 2019-08-07 — End: 2019-08-06

## 2019-08-06 MED ORDER — POLYETHYLENE GLYCOL 3350 17 GM/SCOOP PO POWD
17.00 | ORAL | Status: DC
Start: ? — End: 2019-08-06

## 2019-08-06 MED ORDER — ASPIRIN 81 MG PO TBEC
81.00 | DELAYED_RELEASE_TABLET | ORAL | Status: DC
Start: 2019-08-07 — End: 2019-08-06

## 2019-08-06 MED ORDER — GABAPENTIN 400 MG PO CAPS
1200.00 | ORAL_CAPSULE | ORAL | Status: DC
Start: 2019-08-06 — End: 2019-08-06

## 2019-08-06 MED ORDER — ENOXAPARIN SODIUM 40 MG/0.4ML ~~LOC~~ SOLN
40.00 | SUBCUTANEOUS | Status: DC
Start: 2019-08-07 — End: 2019-08-06

## 2019-08-06 MED ORDER — FENOFIBRATE 160 MG PO TABS
160.00 | ORAL_TABLET | ORAL | Status: DC
Start: 2019-08-07 — End: 2019-08-06

## 2019-08-06 MED ORDER — CYANOCOBALAMIN 1000 MCG PO TABS
1000.00 | ORAL_TABLET | ORAL | Status: DC
Start: 2019-08-07 — End: 2019-08-06

## 2019-08-06 MED ORDER — GENERIC EXTERNAL MEDICATION
1.00 | Status: DC
Start: 2019-08-06 — End: 2019-08-06

## 2019-08-06 MED ORDER — CLONAZEPAM 0.5 MG PO TABS
0.50 | ORAL_TABLET | ORAL | Status: DC
Start: 2019-08-06 — End: 2019-08-06

## 2019-08-06 MED ORDER — GENERIC EXTERNAL MEDICATION
30.00 | Status: DC
Start: 2019-08-06 — End: 2019-08-06

## 2019-08-06 MED ORDER — ESCITALOPRAM OXALATE 10 MG PO TABS
10.00 | ORAL_TABLET | ORAL | Status: DC
Start: 2019-08-07 — End: 2019-08-06

## 2019-08-06 MED ORDER — FISH OIL 1000 MG PO CAPS
1.00 | ORAL_CAPSULE | ORAL | Status: DC
Start: 2019-08-06 — End: 2019-08-06

## 2019-08-06 MED ORDER — NITROGLYCERIN 0.4 MG SL SUBL
0.40 | SUBLINGUAL_TABLET | SUBLINGUAL | Status: DC
Start: ? — End: 2019-08-06

## 2019-08-06 MED ORDER — LABETALOL HCL 5 MG/ML IV SOLN
10.00 | INTRAVENOUS | Status: DC
Start: ? — End: 2019-08-06

## 2019-08-06 MED ORDER — ISOSORBIDE MONONITRATE ER 30 MG PO TB24
30.00 | ORAL_TABLET | ORAL | Status: DC
Start: 2019-08-07 — End: 2019-08-06

## 2019-08-10 DIAGNOSIS — I10 Essential (primary) hypertension: Secondary | ICD-10-CM | POA: Diagnosis not present

## 2019-08-10 DIAGNOSIS — E7849 Other hyperlipidemia: Secondary | ICD-10-CM | POA: Diagnosis not present

## 2019-08-20 DIAGNOSIS — Z23 Encounter for immunization: Secondary | ICD-10-CM | POA: Diagnosis not present

## 2019-08-21 DIAGNOSIS — Z8614 Personal history of Methicillin resistant Staphylococcus aureus infection: Secondary | ICD-10-CM | POA: Diagnosis not present

## 2019-08-21 DIAGNOSIS — E876 Hypokalemia: Secondary | ICD-10-CM | POA: Diagnosis not present

## 2019-08-21 DIAGNOSIS — I129 Hypertensive chronic kidney disease with stage 1 through stage 4 chronic kidney disease, or unspecified chronic kidney disease: Secondary | ICD-10-CM | POA: Diagnosis not present

## 2019-08-21 DIAGNOSIS — E1122 Type 2 diabetes mellitus with diabetic chronic kidney disease: Secondary | ICD-10-CM | POA: Diagnosis not present

## 2019-08-21 DIAGNOSIS — E1151 Type 2 diabetes mellitus with diabetic peripheral angiopathy without gangrene: Secondary | ICD-10-CM | POA: Diagnosis not present

## 2019-08-21 DIAGNOSIS — E1169 Type 2 diabetes mellitus with other specified complication: Secondary | ICD-10-CM | POA: Diagnosis not present

## 2019-08-22 DIAGNOSIS — E11621 Type 2 diabetes mellitus with foot ulcer: Secondary | ICD-10-CM | POA: Diagnosis not present

## 2019-08-22 DIAGNOSIS — L97519 Non-pressure chronic ulcer of other part of right foot with unspecified severity: Secondary | ICD-10-CM | POA: Diagnosis not present

## 2019-08-31 DIAGNOSIS — L97413 Non-pressure chronic ulcer of right heel and midfoot with necrosis of muscle: Secondary | ICD-10-CM | POA: Diagnosis not present

## 2019-09-04 DIAGNOSIS — I10 Essential (primary) hypertension: Secondary | ICD-10-CM | POA: Diagnosis not present

## 2019-09-04 DIAGNOSIS — E1140 Type 2 diabetes mellitus with diabetic neuropathy, unspecified: Secondary | ICD-10-CM | POA: Diagnosis not present

## 2019-09-04 DIAGNOSIS — Z89431 Acquired absence of right foot: Secondary | ICD-10-CM | POA: Diagnosis not present

## 2019-09-04 DIAGNOSIS — E11621 Type 2 diabetes mellitus with foot ulcer: Secondary | ICD-10-CM | POA: Diagnosis not present

## 2019-09-04 DIAGNOSIS — N183 Chronic kidney disease, stage 3 unspecified: Secondary | ICD-10-CM | POA: Diagnosis not present

## 2019-09-04 DIAGNOSIS — L97519 Non-pressure chronic ulcer of other part of right foot with unspecified severity: Secondary | ICD-10-CM | POA: Diagnosis not present

## 2019-09-04 DIAGNOSIS — L89522 Pressure ulcer of left ankle, stage 2: Secondary | ICD-10-CM | POA: Diagnosis not present

## 2019-09-04 DIAGNOSIS — L97413 Non-pressure chronic ulcer of right heel and midfoot with necrosis of muscle: Secondary | ICD-10-CM | POA: Diagnosis not present

## 2019-09-04 DIAGNOSIS — I739 Peripheral vascular disease, unspecified: Secondary | ICD-10-CM | POA: Diagnosis not present

## 2019-09-05 DIAGNOSIS — L97501 Non-pressure chronic ulcer of other part of unspecified foot limited to breakdown of skin: Secondary | ICD-10-CM | POA: Diagnosis not present

## 2019-09-05 DIAGNOSIS — E1140 Type 2 diabetes mellitus with diabetic neuropathy, unspecified: Secondary | ICD-10-CM | POA: Diagnosis not present

## 2019-09-05 DIAGNOSIS — E1143 Type 2 diabetes mellitus with diabetic autonomic (poly)neuropathy: Secondary | ICD-10-CM | POA: Diagnosis not present

## 2019-09-05 DIAGNOSIS — E08621 Diabetes mellitus due to underlying condition with foot ulcer: Secondary | ICD-10-CM | POA: Diagnosis not present

## 2019-09-05 DIAGNOSIS — E11621 Type 2 diabetes mellitus with foot ulcer: Secondary | ICD-10-CM | POA: Diagnosis not present

## 2019-09-05 DIAGNOSIS — E1122 Type 2 diabetes mellitus with diabetic chronic kidney disease: Secondary | ICD-10-CM | POA: Diagnosis not present

## 2019-09-05 DIAGNOSIS — E1165 Type 2 diabetes mellitus with hyperglycemia: Secondary | ICD-10-CM | POA: Diagnosis not present

## 2019-09-05 DIAGNOSIS — N184 Chronic kidney disease, stage 4 (severe): Secondary | ICD-10-CM | POA: Diagnosis not present

## 2019-09-06 DIAGNOSIS — Z794 Long term (current) use of insulin: Secondary | ICD-10-CM | POA: Diagnosis not present

## 2019-09-06 DIAGNOSIS — N1831 Chronic kidney disease, stage 3a: Secondary | ICD-10-CM | POA: Diagnosis not present

## 2019-09-06 DIAGNOSIS — I10 Essential (primary) hypertension: Secondary | ICD-10-CM | POA: Diagnosis not present

## 2019-09-06 DIAGNOSIS — E1122 Type 2 diabetes mellitus with diabetic chronic kidney disease: Secondary | ICD-10-CM | POA: Diagnosis not present

## 2019-09-06 DIAGNOSIS — N250 Renal osteodystrophy: Secondary | ICD-10-CM | POA: Diagnosis not present

## 2019-09-06 DIAGNOSIS — D649 Anemia, unspecified: Secondary | ICD-10-CM | POA: Diagnosis not present

## 2019-09-07 DIAGNOSIS — E1122 Type 2 diabetes mellitus with diabetic chronic kidney disease: Secondary | ICD-10-CM | POA: Diagnosis not present

## 2019-09-07 DIAGNOSIS — I350 Nonrheumatic aortic (valve) stenosis: Secondary | ICD-10-CM | POA: Diagnosis not present

## 2019-09-07 DIAGNOSIS — E7800 Pure hypercholesterolemia, unspecified: Secondary | ICD-10-CM | POA: Diagnosis not present

## 2019-09-07 DIAGNOSIS — E1165 Type 2 diabetes mellitus with hyperglycemia: Secondary | ICD-10-CM | POA: Diagnosis not present

## 2019-09-07 DIAGNOSIS — E11618 Type 2 diabetes mellitus with other diabetic arthropathy: Secondary | ICD-10-CM | POA: Diagnosis not present

## 2019-09-07 DIAGNOSIS — E782 Mixed hyperlipidemia: Secondary | ICD-10-CM | POA: Diagnosis not present

## 2019-09-10 DIAGNOSIS — N183 Chronic kidney disease, stage 3 unspecified: Secondary | ICD-10-CM | POA: Diagnosis not present

## 2019-09-10 DIAGNOSIS — E7849 Other hyperlipidemia: Secondary | ICD-10-CM | POA: Diagnosis not present

## 2019-09-10 DIAGNOSIS — I129 Hypertensive chronic kidney disease with stage 1 through stage 4 chronic kidney disease, or unspecified chronic kidney disease: Secondary | ICD-10-CM | POA: Diagnosis not present

## 2019-09-10 DIAGNOSIS — E1122 Type 2 diabetes mellitus with diabetic chronic kidney disease: Secondary | ICD-10-CM | POA: Diagnosis not present

## 2019-09-14 DIAGNOSIS — E782 Mixed hyperlipidemia: Secondary | ICD-10-CM | POA: Diagnosis not present

## 2019-09-14 DIAGNOSIS — E1122 Type 2 diabetes mellitus with diabetic chronic kidney disease: Secondary | ICD-10-CM | POA: Diagnosis not present

## 2019-09-14 DIAGNOSIS — E11621 Type 2 diabetes mellitus with foot ulcer: Secondary | ICD-10-CM | POA: Diagnosis not present

## 2019-09-14 DIAGNOSIS — L97413 Non-pressure chronic ulcer of right heel and midfoot with necrosis of muscle: Secondary | ICD-10-CM | POA: Diagnosis not present

## 2019-09-14 DIAGNOSIS — I10 Essential (primary) hypertension: Secondary | ICD-10-CM | POA: Diagnosis not present

## 2019-09-14 DIAGNOSIS — E1143 Type 2 diabetes mellitus with diabetic autonomic (poly)neuropathy: Secondary | ICD-10-CM | POA: Diagnosis not present

## 2019-09-14 DIAGNOSIS — I350 Nonrheumatic aortic (valve) stenosis: Secondary | ICD-10-CM | POA: Diagnosis not present

## 2019-09-14 DIAGNOSIS — Z6831 Body mass index (BMI) 31.0-31.9, adult: Secondary | ICD-10-CM | POA: Diagnosis not present

## 2019-09-14 DIAGNOSIS — E1165 Type 2 diabetes mellitus with hyperglycemia: Secondary | ICD-10-CM | POA: Diagnosis not present

## 2019-09-17 DIAGNOSIS — Z23 Encounter for immunization: Secondary | ICD-10-CM | POA: Diagnosis not present

## 2019-10-19 DIAGNOSIS — E782 Mixed hyperlipidemia: Secondary | ICD-10-CM | POA: Diagnosis not present

## 2019-10-19 DIAGNOSIS — E1165 Type 2 diabetes mellitus with hyperglycemia: Secondary | ICD-10-CM | POA: Diagnosis not present

## 2019-10-19 DIAGNOSIS — E039 Hypothyroidism, unspecified: Secondary | ICD-10-CM | POA: Diagnosis not present

## 2019-10-19 DIAGNOSIS — E063 Autoimmune thyroiditis: Secondary | ICD-10-CM | POA: Diagnosis not present

## 2019-10-19 DIAGNOSIS — D510 Vitamin B12 deficiency anemia due to intrinsic factor deficiency: Secondary | ICD-10-CM | POA: Diagnosis not present

## 2019-10-19 DIAGNOSIS — E1140 Type 2 diabetes mellitus with diabetic neuropathy, unspecified: Secondary | ICD-10-CM | POA: Diagnosis not present

## 2019-10-19 DIAGNOSIS — E559 Vitamin D deficiency, unspecified: Secondary | ICD-10-CM | POA: Diagnosis not present

## 2019-10-22 DIAGNOSIS — E11621 Type 2 diabetes mellitus with foot ulcer: Secondary | ICD-10-CM | POA: Diagnosis not present

## 2019-10-22 DIAGNOSIS — N183 Chronic kidney disease, stage 3 unspecified: Secondary | ICD-10-CM | POA: Diagnosis not present

## 2019-10-22 DIAGNOSIS — L97413 Non-pressure chronic ulcer of right heel and midfoot with necrosis of muscle: Secondary | ICD-10-CM | POA: Diagnosis not present

## 2019-10-22 DIAGNOSIS — L97519 Non-pressure chronic ulcer of other part of right foot with unspecified severity: Secondary | ICD-10-CM | POA: Diagnosis not present

## 2019-10-22 DIAGNOSIS — I739 Peripheral vascular disease, unspecified: Secondary | ICD-10-CM | POA: Diagnosis not present

## 2019-10-22 DIAGNOSIS — Z89431 Acquired absence of right foot: Secondary | ICD-10-CM | POA: Diagnosis not present

## 2019-11-08 DIAGNOSIS — Z89431 Acquired absence of right foot: Secondary | ICD-10-CM | POA: Diagnosis not present

## 2019-11-08 DIAGNOSIS — L97519 Non-pressure chronic ulcer of other part of right foot with unspecified severity: Secondary | ICD-10-CM | POA: Diagnosis not present

## 2019-11-08 DIAGNOSIS — N183 Chronic kidney disease, stage 3 unspecified: Secondary | ICD-10-CM | POA: Diagnosis not present

## 2019-11-08 DIAGNOSIS — I739 Peripheral vascular disease, unspecified: Secondary | ICD-10-CM | POA: Diagnosis not present

## 2019-11-08 DIAGNOSIS — E11621 Type 2 diabetes mellitus with foot ulcer: Secondary | ICD-10-CM | POA: Diagnosis not present

## 2019-11-08 DIAGNOSIS — L97413 Non-pressure chronic ulcer of right heel and midfoot with necrosis of muscle: Secondary | ICD-10-CM | POA: Diagnosis not present

## 2019-11-09 DIAGNOSIS — I129 Hypertensive chronic kidney disease with stage 1 through stage 4 chronic kidney disease, or unspecified chronic kidney disease: Secondary | ICD-10-CM | POA: Diagnosis not present

## 2019-11-09 DIAGNOSIS — E1122 Type 2 diabetes mellitus with diabetic chronic kidney disease: Secondary | ICD-10-CM | POA: Diagnosis not present

## 2019-11-09 DIAGNOSIS — E7849 Other hyperlipidemia: Secondary | ICD-10-CM | POA: Diagnosis not present

## 2019-11-09 DIAGNOSIS — N183 Chronic kidney disease, stage 3 unspecified: Secondary | ICD-10-CM | POA: Diagnosis not present

## 2019-11-22 DIAGNOSIS — E08621 Diabetes mellitus due to underlying condition with foot ulcer: Secondary | ICD-10-CM | POA: Diagnosis not present

## 2019-11-22 DIAGNOSIS — N183 Chronic kidney disease, stage 3 unspecified: Secondary | ICD-10-CM | POA: Diagnosis not present

## 2019-11-22 DIAGNOSIS — E11621 Type 2 diabetes mellitus with foot ulcer: Secondary | ICD-10-CM | POA: Diagnosis not present

## 2019-11-22 DIAGNOSIS — L97412 Non-pressure chronic ulcer of right heel and midfoot with fat layer exposed: Secondary | ICD-10-CM | POA: Diagnosis not present

## 2019-11-22 DIAGNOSIS — L97519 Non-pressure chronic ulcer of other part of right foot with unspecified severity: Secondary | ICD-10-CM | POA: Diagnosis not present

## 2019-11-22 DIAGNOSIS — M24571 Contracture, right ankle: Secondary | ICD-10-CM | POA: Diagnosis not present

## 2019-11-22 DIAGNOSIS — I739 Peripheral vascular disease, unspecified: Secondary | ICD-10-CM | POA: Diagnosis not present

## 2019-12-05 DIAGNOSIS — E1140 Type 2 diabetes mellitus with diabetic neuropathy, unspecified: Secondary | ICD-10-CM | POA: Diagnosis not present

## 2019-12-05 DIAGNOSIS — L110 Acquired keratosis follicularis: Secondary | ICD-10-CM | POA: Diagnosis not present

## 2019-12-05 DIAGNOSIS — I739 Peripheral vascular disease, unspecified: Secondary | ICD-10-CM | POA: Diagnosis not present

## 2019-12-05 DIAGNOSIS — M79671 Pain in right foot: Secondary | ICD-10-CM | POA: Diagnosis not present

## 2019-12-05 DIAGNOSIS — M79672 Pain in left foot: Secondary | ICD-10-CM | POA: Diagnosis not present

## 2019-12-11 DIAGNOSIS — E7849 Other hyperlipidemia: Secondary | ICD-10-CM | POA: Diagnosis not present

## 2019-12-11 DIAGNOSIS — D124 Benign neoplasm of descending colon: Secondary | ICD-10-CM | POA: Diagnosis not present

## 2019-12-11 DIAGNOSIS — E1122 Type 2 diabetes mellitus with diabetic chronic kidney disease: Secondary | ICD-10-CM | POA: Diagnosis not present

## 2019-12-11 DIAGNOSIS — D122 Benign neoplasm of ascending colon: Secondary | ICD-10-CM | POA: Diagnosis not present

## 2019-12-11 DIAGNOSIS — Z1211 Encounter for screening for malignant neoplasm of colon: Secondary | ICD-10-CM | POA: Diagnosis not present

## 2019-12-11 DIAGNOSIS — D123 Benign neoplasm of transverse colon: Secondary | ICD-10-CM | POA: Diagnosis not present

## 2019-12-11 DIAGNOSIS — N183 Chronic kidney disease, stage 3 unspecified: Secondary | ICD-10-CM | POA: Diagnosis not present

## 2019-12-11 DIAGNOSIS — D120 Benign neoplasm of cecum: Secondary | ICD-10-CM | POA: Diagnosis not present

## 2019-12-11 DIAGNOSIS — Z86010 Personal history of colonic polyps: Secondary | ICD-10-CM | POA: Diagnosis not present

## 2019-12-11 DIAGNOSIS — I129 Hypertensive chronic kidney disease with stage 1 through stage 4 chronic kidney disease, or unspecified chronic kidney disease: Secondary | ICD-10-CM | POA: Diagnosis not present

## 2019-12-12 DIAGNOSIS — Z951 Presence of aortocoronary bypass graft: Secondary | ICD-10-CM | POA: Diagnosis not present

## 2019-12-12 DIAGNOSIS — I25118 Atherosclerotic heart disease of native coronary artery with other forms of angina pectoris: Secondary | ICD-10-CM | POA: Diagnosis not present

## 2019-12-12 DIAGNOSIS — I739 Peripheral vascular disease, unspecified: Secondary | ICD-10-CM | POA: Diagnosis not present

## 2019-12-12 DIAGNOSIS — E08621 Diabetes mellitus due to underlying condition with foot ulcer: Secondary | ICD-10-CM | POA: Diagnosis not present

## 2019-12-12 DIAGNOSIS — Z7982 Long term (current) use of aspirin: Secondary | ICD-10-CM | POA: Diagnosis not present

## 2019-12-12 DIAGNOSIS — M6701 Short Achilles tendon (acquired), right ankle: Secondary | ICD-10-CM | POA: Diagnosis not present

## 2019-12-12 DIAGNOSIS — Z888 Allergy status to other drugs, medicaments and biological substances status: Secondary | ICD-10-CM | POA: Diagnosis not present

## 2019-12-12 DIAGNOSIS — K219 Gastro-esophageal reflux disease without esophagitis: Secondary | ICD-10-CM | POA: Diagnosis not present

## 2019-12-12 DIAGNOSIS — Z89421 Acquired absence of other right toe(s): Secondary | ICD-10-CM | POA: Diagnosis not present

## 2019-12-12 DIAGNOSIS — L97412 Non-pressure chronic ulcer of right heel and midfoot with fat layer exposed: Secondary | ICD-10-CM | POA: Diagnosis not present

## 2019-12-12 DIAGNOSIS — L97419 Non-pressure chronic ulcer of right heel and midfoot with unspecified severity: Secondary | ICD-10-CM | POA: Diagnosis not present

## 2019-12-12 DIAGNOSIS — Z952 Presence of prosthetic heart valve: Secondary | ICD-10-CM | POA: Diagnosis not present

## 2019-12-12 DIAGNOSIS — Z7902 Long term (current) use of antithrombotics/antiplatelets: Secondary | ICD-10-CM | POA: Diagnosis not present

## 2019-12-12 DIAGNOSIS — E1169 Type 2 diabetes mellitus with other specified complication: Secondary | ICD-10-CM | POA: Diagnosis not present

## 2019-12-12 DIAGNOSIS — N183 Chronic kidney disease, stage 3 unspecified: Secondary | ICD-10-CM | POA: Diagnosis not present

## 2019-12-12 DIAGNOSIS — E7800 Pure hypercholesterolemia, unspecified: Secondary | ICD-10-CM | POA: Diagnosis not present

## 2019-12-12 DIAGNOSIS — M24571 Contracture, right ankle: Secondary | ICD-10-CM | POA: Diagnosis not present

## 2019-12-12 DIAGNOSIS — G4733 Obstructive sleep apnea (adult) (pediatric): Secondary | ICD-10-CM | POA: Diagnosis not present

## 2019-12-12 DIAGNOSIS — Z794 Long term (current) use of insulin: Secondary | ICD-10-CM | POA: Diagnosis not present

## 2019-12-12 DIAGNOSIS — Z91030 Bee allergy status: Secondary | ICD-10-CM | POA: Diagnosis not present

## 2019-12-12 DIAGNOSIS — I350 Nonrheumatic aortic (valve) stenosis: Secondary | ICD-10-CM | POA: Diagnosis not present

## 2019-12-12 DIAGNOSIS — E1152 Type 2 diabetes mellitus with diabetic peripheral angiopathy with gangrene: Secondary | ICD-10-CM | POA: Diagnosis not present

## 2019-12-12 DIAGNOSIS — E1122 Type 2 diabetes mellitus with diabetic chronic kidney disease: Secondary | ICD-10-CM | POA: Diagnosis not present

## 2019-12-12 DIAGNOSIS — I129 Hypertensive chronic kidney disease with stage 1 through stage 4 chronic kidney disease, or unspecified chronic kidney disease: Secondary | ICD-10-CM | POA: Diagnosis not present

## 2019-12-12 DIAGNOSIS — E11621 Type 2 diabetes mellitus with foot ulcer: Secondary | ICD-10-CM | POA: Diagnosis not present

## 2019-12-12 DIAGNOSIS — E039 Hypothyroidism, unspecified: Secondary | ICD-10-CM | POA: Diagnosis not present

## 2019-12-14 DIAGNOSIS — I517 Cardiomegaly: Secondary | ICD-10-CM | POA: Diagnosis not present

## 2019-12-14 DIAGNOSIS — R519 Headache, unspecified: Secondary | ICD-10-CM | POA: Diagnosis not present

## 2019-12-14 DIAGNOSIS — H532 Diplopia: Secondary | ICD-10-CM | POA: Diagnosis not present

## 2019-12-14 DIAGNOSIS — Z953 Presence of xenogenic heart valve: Secondary | ICD-10-CM | POA: Diagnosis not present

## 2019-12-14 DIAGNOSIS — Z952 Presence of prosthetic heart valve: Secondary | ICD-10-CM | POA: Diagnosis not present

## 2019-12-24 DIAGNOSIS — E11621 Type 2 diabetes mellitus with foot ulcer: Secondary | ICD-10-CM | POA: Diagnosis not present

## 2019-12-24 DIAGNOSIS — I739 Peripheral vascular disease, unspecified: Secondary | ICD-10-CM | POA: Diagnosis not present

## 2019-12-24 DIAGNOSIS — N183 Chronic kidney disease, stage 3 unspecified: Secondary | ICD-10-CM | POA: Diagnosis not present

## 2019-12-24 DIAGNOSIS — L97519 Non-pressure chronic ulcer of other part of right foot with unspecified severity: Secondary | ICD-10-CM | POA: Diagnosis not present

## 2019-12-24 DIAGNOSIS — Z4789 Encounter for other orthopedic aftercare: Secondary | ICD-10-CM | POA: Diagnosis not present

## 2020-01-02 DIAGNOSIS — N183 Chronic kidney disease, stage 3 unspecified: Secondary | ICD-10-CM | POA: Diagnosis not present

## 2020-01-02 DIAGNOSIS — Z4789 Encounter for other orthopedic aftercare: Secondary | ICD-10-CM | POA: Diagnosis not present

## 2020-01-02 DIAGNOSIS — E11621 Type 2 diabetes mellitus with foot ulcer: Secondary | ICD-10-CM | POA: Diagnosis not present

## 2020-01-02 DIAGNOSIS — E08621 Diabetes mellitus due to underlying condition with foot ulcer: Secondary | ICD-10-CM | POA: Diagnosis not present

## 2020-01-02 DIAGNOSIS — Z89431 Acquired absence of right foot: Secondary | ICD-10-CM | POA: Diagnosis not present

## 2020-01-02 DIAGNOSIS — L97519 Non-pressure chronic ulcer of other part of right foot with unspecified severity: Secondary | ICD-10-CM | POA: Diagnosis not present

## 2020-01-02 DIAGNOSIS — L97411 Non-pressure chronic ulcer of right heel and midfoot limited to breakdown of skin: Secondary | ICD-10-CM | POA: Diagnosis not present

## 2020-01-03 DIAGNOSIS — I10 Essential (primary) hypertension: Secondary | ICD-10-CM | POA: Diagnosis not present

## 2020-01-03 DIAGNOSIS — Z6831 Body mass index (BMI) 31.0-31.9, adult: Secondary | ICD-10-CM | POA: Diagnosis not present

## 2020-01-03 DIAGNOSIS — R519 Headache, unspecified: Secondary | ICD-10-CM | POA: Diagnosis not present

## 2020-01-03 DIAGNOSIS — R079 Chest pain, unspecified: Secondary | ICD-10-CM | POA: Diagnosis not present

## 2020-01-03 DIAGNOSIS — Z23 Encounter for immunization: Secondary | ICD-10-CM | POA: Diagnosis not present

## 2020-01-03 DIAGNOSIS — E1165 Type 2 diabetes mellitus with hyperglycemia: Secondary | ICD-10-CM | POA: Diagnosis not present

## 2020-01-06 DIAGNOSIS — Z89431 Acquired absence of right foot: Secondary | ICD-10-CM | POA: Diagnosis not present

## 2020-01-06 DIAGNOSIS — F329 Major depressive disorder, single episode, unspecified: Secondary | ICD-10-CM | POA: Diagnosis not present

## 2020-01-06 DIAGNOSIS — I452 Bifascicular block: Secondary | ICD-10-CM | POA: Diagnosis not present

## 2020-01-06 DIAGNOSIS — G4733 Obstructive sleep apnea (adult) (pediatric): Secondary | ICD-10-CM | POA: Diagnosis not present

## 2020-01-06 DIAGNOSIS — E1142 Type 2 diabetes mellitus with diabetic polyneuropathy: Secondary | ICD-10-CM | POA: Diagnosis not present

## 2020-01-06 DIAGNOSIS — I10 Essential (primary) hypertension: Secondary | ICD-10-CM | POA: Diagnosis not present

## 2020-01-06 DIAGNOSIS — Z794 Long term (current) use of insulin: Secondary | ICD-10-CM | POA: Diagnosis not present

## 2020-01-06 DIAGNOSIS — R0602 Shortness of breath: Secondary | ICD-10-CM | POA: Diagnosis not present

## 2020-01-06 DIAGNOSIS — E039 Hypothyroidism, unspecified: Secondary | ICD-10-CM | POA: Diagnosis not present

## 2020-01-06 DIAGNOSIS — I739 Peripheral vascular disease, unspecified: Secondary | ICD-10-CM | POA: Diagnosis not present

## 2020-01-06 DIAGNOSIS — R001 Bradycardia, unspecified: Secondary | ICD-10-CM | POA: Diagnosis not present

## 2020-01-06 DIAGNOSIS — N179 Acute kidney failure, unspecified: Secondary | ICD-10-CM | POA: Diagnosis not present

## 2020-01-06 DIAGNOSIS — R519 Headache, unspecified: Secondary | ICD-10-CM | POA: Diagnosis not present

## 2020-01-06 DIAGNOSIS — I25810 Atherosclerosis of coronary artery bypass graft(s) without angina pectoris: Secondary | ICD-10-CM | POA: Diagnosis not present

## 2020-01-06 DIAGNOSIS — L97413 Non-pressure chronic ulcer of right heel and midfoot with necrosis of muscle: Secondary | ICD-10-CM | POA: Diagnosis not present

## 2020-01-06 DIAGNOSIS — E7800 Pure hypercholesterolemia, unspecified: Secondary | ICD-10-CM | POA: Diagnosis not present

## 2020-01-06 DIAGNOSIS — R2241 Localized swelling, mass and lump, right lower limb: Secondary | ICD-10-CM | POA: Diagnosis not present

## 2020-01-06 DIAGNOSIS — R531 Weakness: Secondary | ICD-10-CM | POA: Diagnosis not present

## 2020-01-06 DIAGNOSIS — I959 Hypotension, unspecified: Secondary | ICD-10-CM | POA: Diagnosis not present

## 2020-01-07 DIAGNOSIS — N179 Acute kidney failure, unspecified: Secondary | ICD-10-CM | POA: Diagnosis not present

## 2020-01-07 DIAGNOSIS — L97519 Non-pressure chronic ulcer of other part of right foot with unspecified severity: Secondary | ICD-10-CM | POA: Diagnosis not present

## 2020-01-07 DIAGNOSIS — Z89431 Acquired absence of right foot: Secondary | ICD-10-CM | POA: Diagnosis not present

## 2020-01-07 DIAGNOSIS — R001 Bradycardia, unspecified: Secondary | ICD-10-CM | POA: Diagnosis not present

## 2020-01-07 DIAGNOSIS — I10 Essential (primary) hypertension: Secondary | ICD-10-CM | POA: Diagnosis not present

## 2020-01-07 DIAGNOSIS — I2510 Atherosclerotic heart disease of native coronary artery without angina pectoris: Secondary | ICD-10-CM | POA: Diagnosis not present

## 2020-01-07 DIAGNOSIS — Z952 Presence of prosthetic heart valve: Secondary | ICD-10-CM | POA: Diagnosis not present

## 2020-01-07 DIAGNOSIS — E1151 Type 2 diabetes mellitus with diabetic peripheral angiopathy without gangrene: Secondary | ICD-10-CM | POA: Diagnosis not present

## 2020-01-07 DIAGNOSIS — R2241 Localized swelling, mass and lump, right lower limb: Secondary | ICD-10-CM | POA: Diagnosis not present

## 2020-01-07 DIAGNOSIS — E11621 Type 2 diabetes mellitus with foot ulcer: Secondary | ICD-10-CM | POA: Diagnosis not present

## 2020-01-08 DIAGNOSIS — L97413 Non-pressure chronic ulcer of right heel and midfoot with necrosis of muscle: Secondary | ICD-10-CM | POA: Diagnosis not present

## 2020-01-08 DIAGNOSIS — R001 Bradycardia, unspecified: Secondary | ICD-10-CM | POA: Diagnosis not present

## 2020-01-09 DIAGNOSIS — I959 Hypotension, unspecified: Secondary | ICD-10-CM | POA: Diagnosis present

## 2020-01-09 DIAGNOSIS — F329 Major depressive disorder, single episode, unspecified: Secondary | ICD-10-CM | POA: Diagnosis present

## 2020-01-09 DIAGNOSIS — I4519 Other right bundle-branch block: Secondary | ICD-10-CM | POA: Diagnosis present

## 2020-01-09 DIAGNOSIS — E11621 Type 2 diabetes mellitus with foot ulcer: Secondary | ICD-10-CM | POA: Diagnosis present

## 2020-01-09 DIAGNOSIS — I440 Atrioventricular block, first degree: Secondary | ICD-10-CM | POA: Diagnosis present

## 2020-01-09 DIAGNOSIS — R2689 Other abnormalities of gait and mobility: Secondary | ICD-10-CM | POA: Diagnosis present

## 2020-01-09 DIAGNOSIS — E785 Hyperlipidemia, unspecified: Secondary | ICD-10-CM | POA: Diagnosis present

## 2020-01-09 DIAGNOSIS — I739 Peripheral vascular disease, unspecified: Secondary | ICD-10-CM | POA: Diagnosis present

## 2020-01-09 DIAGNOSIS — E039 Hypothyroidism, unspecified: Secondary | ICD-10-CM | POA: Diagnosis present

## 2020-01-09 DIAGNOSIS — E1122 Type 2 diabetes mellitus with diabetic chronic kidney disease: Secondary | ICD-10-CM | POA: Diagnosis present

## 2020-01-09 DIAGNOSIS — R531 Weakness: Secondary | ICD-10-CM | POA: Diagnosis not present

## 2020-01-09 DIAGNOSIS — G4733 Obstructive sleep apnea (adult) (pediatric): Secondary | ICD-10-CM | POA: Diagnosis present

## 2020-01-09 DIAGNOSIS — R42 Dizziness and giddiness: Secondary | ICD-10-CM | POA: Diagnosis present

## 2020-01-09 DIAGNOSIS — Z952 Presence of prosthetic heart valve: Secondary | ICD-10-CM | POA: Diagnosis not present

## 2020-01-09 DIAGNOSIS — I452 Bifascicular block: Secondary | ICD-10-CM | POA: Diagnosis present

## 2020-01-09 DIAGNOSIS — E1142 Type 2 diabetes mellitus with diabetic polyneuropathy: Secondary | ICD-10-CM | POA: Diagnosis present

## 2020-01-09 DIAGNOSIS — Z66 Do not resuscitate: Secondary | ICD-10-CM | POA: Diagnosis present

## 2020-01-09 DIAGNOSIS — L97413 Non-pressure chronic ulcer of right heel and midfoot with necrosis of muscle: Secondary | ICD-10-CM | POA: Diagnosis present

## 2020-01-09 DIAGNOSIS — I350 Nonrheumatic aortic (valve) stenosis: Secondary | ICD-10-CM | POA: Diagnosis present

## 2020-01-09 DIAGNOSIS — I2510 Atherosclerotic heart disease of native coronary artery without angina pectoris: Secondary | ICD-10-CM | POA: Diagnosis present

## 2020-01-09 DIAGNOSIS — N183 Chronic kidney disease, stage 3 unspecified: Secondary | ICD-10-CM | POA: Diagnosis present

## 2020-01-09 DIAGNOSIS — E1151 Type 2 diabetes mellitus with diabetic peripheral angiopathy without gangrene: Secondary | ICD-10-CM | POA: Diagnosis present

## 2020-01-09 DIAGNOSIS — N179 Acute kidney failure, unspecified: Secondary | ICD-10-CM | POA: Diagnosis present

## 2020-01-09 DIAGNOSIS — R001 Bradycardia, unspecified: Secondary | ICD-10-CM | POA: Diagnosis not present

## 2020-01-09 DIAGNOSIS — I129 Hypertensive chronic kidney disease with stage 1 through stage 4 chronic kidney disease, or unspecified chronic kidney disease: Secondary | ICD-10-CM | POA: Diagnosis present

## 2020-01-10 DIAGNOSIS — E782 Mixed hyperlipidemia: Secondary | ICD-10-CM | POA: Diagnosis not present

## 2020-01-10 DIAGNOSIS — N183 Chronic kidney disease, stage 3 unspecified: Secondary | ICD-10-CM | POA: Diagnosis not present

## 2020-01-10 DIAGNOSIS — E1122 Type 2 diabetes mellitus with diabetic chronic kidney disease: Secondary | ICD-10-CM | POA: Diagnosis not present

## 2020-01-10 DIAGNOSIS — E11621 Type 2 diabetes mellitus with foot ulcer: Secondary | ICD-10-CM | POA: Diagnosis not present

## 2020-01-10 DIAGNOSIS — E7800 Pure hypercholesterolemia, unspecified: Secondary | ICD-10-CM | POA: Diagnosis not present

## 2020-01-10 DIAGNOSIS — I10 Essential (primary) hypertension: Secondary | ICD-10-CM | POA: Diagnosis not present

## 2020-01-10 DIAGNOSIS — I129 Hypertensive chronic kidney disease with stage 1 through stage 4 chronic kidney disease, or unspecified chronic kidney disease: Secondary | ICD-10-CM | POA: Diagnosis not present

## 2020-01-10 DIAGNOSIS — E039 Hypothyroidism, unspecified: Secondary | ICD-10-CM | POA: Diagnosis not present

## 2020-01-10 DIAGNOSIS — E1140 Type 2 diabetes mellitus with diabetic neuropathy, unspecified: Secondary | ICD-10-CM | POA: Diagnosis not present

## 2020-01-14 DIAGNOSIS — R001 Bradycardia, unspecified: Secondary | ICD-10-CM | POA: Diagnosis not present

## 2020-01-14 DIAGNOSIS — E782 Mixed hyperlipidemia: Secondary | ICD-10-CM | POA: Diagnosis not present

## 2020-01-14 DIAGNOSIS — E1122 Type 2 diabetes mellitus with diabetic chronic kidney disease: Secondary | ICD-10-CM | POA: Diagnosis not present

## 2020-01-14 DIAGNOSIS — E1143 Type 2 diabetes mellitus with diabetic autonomic (poly)neuropathy: Secondary | ICD-10-CM | POA: Diagnosis not present

## 2020-01-14 DIAGNOSIS — E11621 Type 2 diabetes mellitus with foot ulcer: Secondary | ICD-10-CM | POA: Diagnosis not present

## 2020-01-14 DIAGNOSIS — I10 Essential (primary) hypertension: Secondary | ICD-10-CM | POA: Diagnosis not present

## 2020-01-14 DIAGNOSIS — E1165 Type 2 diabetes mellitus with hyperglycemia: Secondary | ICD-10-CM | POA: Diagnosis not present

## 2020-01-14 DIAGNOSIS — E1140 Type 2 diabetes mellitus with diabetic neuropathy, unspecified: Secondary | ICD-10-CM | POA: Diagnosis not present

## 2020-01-17 DIAGNOSIS — Z89431 Acquired absence of right foot: Secondary | ICD-10-CM | POA: Diagnosis not present

## 2020-01-17 DIAGNOSIS — L97411 Non-pressure chronic ulcer of right heel and midfoot limited to breakdown of skin: Secondary | ICD-10-CM | POA: Diagnosis not present

## 2020-01-17 DIAGNOSIS — E11621 Type 2 diabetes mellitus with foot ulcer: Secondary | ICD-10-CM | POA: Diagnosis not present

## 2020-01-17 DIAGNOSIS — N183 Chronic kidney disease, stage 3 unspecified: Secondary | ICD-10-CM | POA: Diagnosis not present

## 2020-01-17 DIAGNOSIS — L97519 Non-pressure chronic ulcer of other part of right foot with unspecified severity: Secondary | ICD-10-CM | POA: Diagnosis not present

## 2020-01-17 DIAGNOSIS — E08621 Diabetes mellitus due to underlying condition with foot ulcer: Secondary | ICD-10-CM | POA: Diagnosis not present

## 2020-01-21 DIAGNOSIS — E559 Vitamin D deficiency, unspecified: Secondary | ICD-10-CM | POA: Diagnosis not present

## 2020-01-21 DIAGNOSIS — E782 Mixed hyperlipidemia: Secondary | ICD-10-CM | POA: Diagnosis not present

## 2020-01-21 DIAGNOSIS — E1165 Type 2 diabetes mellitus with hyperglycemia: Secondary | ICD-10-CM | POA: Diagnosis not present

## 2020-01-21 DIAGNOSIS — E063 Autoimmune thyroiditis: Secondary | ICD-10-CM | POA: Diagnosis not present

## 2020-01-21 DIAGNOSIS — E039 Hypothyroidism, unspecified: Secondary | ICD-10-CM | POA: Diagnosis not present

## 2020-01-21 DIAGNOSIS — D510 Vitamin B12 deficiency anemia due to intrinsic factor deficiency: Secondary | ICD-10-CM | POA: Diagnosis not present

## 2020-01-28 DIAGNOSIS — R519 Headache, unspecified: Secondary | ICD-10-CM | POA: Diagnosis not present

## 2020-01-28 DIAGNOSIS — Z79899 Other long term (current) drug therapy: Secondary | ICD-10-CM | POA: Diagnosis not present

## 2020-01-28 DIAGNOSIS — I2510 Atherosclerotic heart disease of native coronary artery without angina pectoris: Secondary | ICD-10-CM | POA: Diagnosis not present

## 2020-01-28 DIAGNOSIS — G4733 Obstructive sleep apnea (adult) (pediatric): Secondary | ICD-10-CM | POA: Diagnosis not present

## 2020-01-28 DIAGNOSIS — E785 Hyperlipidemia, unspecified: Secondary | ICD-10-CM | POA: Diagnosis not present

## 2020-01-28 DIAGNOSIS — F418 Other specified anxiety disorders: Secondary | ICD-10-CM | POA: Diagnosis not present

## 2020-01-28 DIAGNOSIS — Z8719 Personal history of other diseases of the digestive system: Secondary | ICD-10-CM | POA: Diagnosis not present

## 2020-01-28 DIAGNOSIS — K219 Gastro-esophageal reflux disease without esophagitis: Secondary | ICD-10-CM | POA: Diagnosis not present

## 2020-01-28 DIAGNOSIS — Z794 Long term (current) use of insulin: Secondary | ICD-10-CM | POA: Diagnosis not present

## 2020-01-28 DIAGNOSIS — R531 Weakness: Secondary | ICD-10-CM | POA: Diagnosis not present

## 2020-01-28 DIAGNOSIS — E079 Disorder of thyroid, unspecified: Secondary | ICD-10-CM | POA: Diagnosis not present

## 2020-01-28 DIAGNOSIS — Z7982 Long term (current) use of aspirin: Secondary | ICD-10-CM | POA: Diagnosis not present

## 2020-01-28 DIAGNOSIS — E119 Type 2 diabetes mellitus without complications: Secondary | ICD-10-CM | POA: Diagnosis not present

## 2020-01-28 DIAGNOSIS — Z91030 Bee allergy status: Secondary | ICD-10-CM | POA: Diagnosis not present

## 2020-01-28 DIAGNOSIS — I10 Essential (primary) hypertension: Secondary | ICD-10-CM | POA: Diagnosis not present

## 2020-01-30 DIAGNOSIS — I10 Essential (primary) hypertension: Secondary | ICD-10-CM | POA: Diagnosis not present

## 2020-01-30 DIAGNOSIS — Z6832 Body mass index (BMI) 32.0-32.9, adult: Secondary | ICD-10-CM | POA: Diagnosis not present

## 2020-02-09 DIAGNOSIS — E1122 Type 2 diabetes mellitus with diabetic chronic kidney disease: Secondary | ICD-10-CM | POA: Diagnosis not present

## 2020-02-09 DIAGNOSIS — E7849 Other hyperlipidemia: Secondary | ICD-10-CM | POA: Diagnosis not present

## 2020-02-09 DIAGNOSIS — I129 Hypertensive chronic kidney disease with stage 1 through stage 4 chronic kidney disease, or unspecified chronic kidney disease: Secondary | ICD-10-CM | POA: Diagnosis not present

## 2020-02-09 DIAGNOSIS — N183 Chronic kidney disease, stage 3 unspecified: Secondary | ICD-10-CM | POA: Diagnosis not present

## 2020-02-11 DIAGNOSIS — G4733 Obstructive sleep apnea (adult) (pediatric): Secondary | ICD-10-CM | POA: Diagnosis not present

## 2020-02-11 DIAGNOSIS — I10 Essential (primary) hypertension: Secondary | ICD-10-CM | POA: Diagnosis not present

## 2020-02-11 DIAGNOSIS — G4452 New daily persistent headache (NDPH): Secondary | ICD-10-CM | POA: Diagnosis not present

## 2020-02-11 DIAGNOSIS — R519 Headache, unspecified: Secondary | ICD-10-CM | POA: Diagnosis not present

## 2020-02-14 DIAGNOSIS — L97519 Non-pressure chronic ulcer of other part of right foot with unspecified severity: Secondary | ICD-10-CM | POA: Diagnosis not present

## 2020-02-14 DIAGNOSIS — N183 Chronic kidney disease, stage 3 unspecified: Secondary | ICD-10-CM | POA: Diagnosis not present

## 2020-02-14 DIAGNOSIS — E08621 Diabetes mellitus due to underlying condition with foot ulcer: Secondary | ICD-10-CM | POA: Diagnosis not present

## 2020-02-14 DIAGNOSIS — E11621 Type 2 diabetes mellitus with foot ulcer: Secondary | ICD-10-CM | POA: Diagnosis not present

## 2020-02-14 DIAGNOSIS — L97411 Non-pressure chronic ulcer of right heel and midfoot limited to breakdown of skin: Secondary | ICD-10-CM | POA: Diagnosis not present

## 2020-02-14 DIAGNOSIS — Z89431 Acquired absence of right foot: Secondary | ICD-10-CM | POA: Diagnosis not present

## 2020-02-19 DIAGNOSIS — Z9989 Dependence on other enabling machines and devices: Secondary | ICD-10-CM | POA: Diagnosis not present

## 2020-02-19 DIAGNOSIS — G4733 Obstructive sleep apnea (adult) (pediatric): Secondary | ICD-10-CM | POA: Diagnosis not present

## 2020-02-22 DIAGNOSIS — E876 Hypokalemia: Secondary | ICD-10-CM | POA: Diagnosis not present

## 2020-02-22 DIAGNOSIS — D649 Anemia, unspecified: Secondary | ICD-10-CM | POA: Diagnosis not present

## 2020-02-22 DIAGNOSIS — F339 Major depressive disorder, recurrent, unspecified: Secondary | ICD-10-CM | POA: Diagnosis not present

## 2020-02-22 DIAGNOSIS — N1831 Chronic kidney disease, stage 3a: Secondary | ICD-10-CM | POA: Diagnosis not present

## 2020-02-22 DIAGNOSIS — E1165 Type 2 diabetes mellitus with hyperglycemia: Secondary | ICD-10-CM | POA: Diagnosis not present

## 2020-02-22 DIAGNOSIS — L97519 Non-pressure chronic ulcer of other part of right foot with unspecified severity: Secondary | ICD-10-CM | POA: Diagnosis not present

## 2020-02-22 DIAGNOSIS — Z794 Long term (current) use of insulin: Secondary | ICD-10-CM | POA: Diagnosis not present

## 2020-02-22 DIAGNOSIS — M6281 Muscle weakness (generalized): Secondary | ICD-10-CM | POA: Diagnosis not present

## 2020-02-22 DIAGNOSIS — R296 Repeated falls: Secondary | ICD-10-CM | POA: Diagnosis not present

## 2020-02-22 DIAGNOSIS — E1122 Type 2 diabetes mellitus with diabetic chronic kidney disease: Secondary | ICD-10-CM | POA: Diagnosis not present

## 2020-02-22 DIAGNOSIS — R278 Other lack of coordination: Secondary | ICD-10-CM | POA: Diagnosis not present

## 2020-02-22 DIAGNOSIS — J309 Allergic rhinitis, unspecified: Secondary | ICD-10-CM | POA: Diagnosis not present

## 2020-02-22 DIAGNOSIS — K219 Gastro-esophageal reflux disease without esophagitis: Secondary | ICD-10-CM | POA: Diagnosis not present

## 2020-02-22 DIAGNOSIS — F419 Anxiety disorder, unspecified: Secondary | ICD-10-CM | POA: Diagnosis not present

## 2020-02-22 DIAGNOSIS — R519 Headache, unspecified: Secondary | ICD-10-CM | POA: Diagnosis not present

## 2020-02-22 DIAGNOSIS — R262 Difficulty in walking, not elsewhere classified: Secondary | ICD-10-CM | POA: Diagnosis not present

## 2020-02-22 DIAGNOSIS — E1140 Type 2 diabetes mellitus with diabetic neuropathy, unspecified: Secondary | ICD-10-CM | POA: Diagnosis not present

## 2020-02-22 DIAGNOSIS — I10 Essential (primary) hypertension: Secondary | ICD-10-CM | POA: Diagnosis not present

## 2020-02-22 DIAGNOSIS — N250 Renal osteodystrophy: Secondary | ICD-10-CM | POA: Diagnosis not present

## 2020-02-22 DIAGNOSIS — Z89421 Acquired absence of other right toe(s): Secondary | ICD-10-CM | POA: Diagnosis not present

## 2020-02-22 DIAGNOSIS — E039 Hypothyroidism, unspecified: Secondary | ICD-10-CM | POA: Diagnosis not present

## 2020-02-22 DIAGNOSIS — N183 Chronic kidney disease, stage 3 unspecified: Secondary | ICD-10-CM | POA: Diagnosis not present

## 2020-02-22 DIAGNOSIS — I129 Hypertensive chronic kidney disease with stage 1 through stage 4 chronic kidney disease, or unspecified chronic kidney disease: Secondary | ICD-10-CM | POA: Diagnosis not present

## 2020-02-22 DIAGNOSIS — E785 Hyperlipidemia, unspecified: Secondary | ICD-10-CM | POA: Diagnosis not present

## 2020-02-22 DIAGNOSIS — E11621 Type 2 diabetes mellitus with foot ulcer: Secondary | ICD-10-CM | POA: Diagnosis not present

## 2020-02-22 DIAGNOSIS — Z119 Encounter for screening for infectious and parasitic diseases, unspecified: Secondary | ICD-10-CM | POA: Diagnosis not present

## 2020-02-22 DIAGNOSIS — I350 Nonrheumatic aortic (valve) stenosis: Secondary | ICD-10-CM | POA: Diagnosis not present

## 2020-02-22 DIAGNOSIS — I739 Peripheral vascular disease, unspecified: Secondary | ICD-10-CM | POA: Diagnosis not present

## 2020-02-25 DIAGNOSIS — E1140 Type 2 diabetes mellitus with diabetic neuropathy, unspecified: Secondary | ICD-10-CM | POA: Diagnosis not present

## 2020-02-25 DIAGNOSIS — I350 Nonrheumatic aortic (valve) stenosis: Secondary | ICD-10-CM | POA: Diagnosis not present

## 2020-02-25 DIAGNOSIS — I739 Peripheral vascular disease, unspecified: Secondary | ICD-10-CM | POA: Diagnosis not present

## 2020-02-25 DIAGNOSIS — F339 Major depressive disorder, recurrent, unspecified: Secondary | ICD-10-CM | POA: Diagnosis not present

## 2020-02-27 DIAGNOSIS — I739 Peripheral vascular disease, unspecified: Secondary | ICD-10-CM | POA: Diagnosis not present

## 2020-02-27 DIAGNOSIS — I350 Nonrheumatic aortic (valve) stenosis: Secondary | ICD-10-CM | POA: Diagnosis not present

## 2020-02-27 DIAGNOSIS — L97519 Non-pressure chronic ulcer of other part of right foot with unspecified severity: Secondary | ICD-10-CM | POA: Diagnosis not present

## 2020-02-27 DIAGNOSIS — E11621 Type 2 diabetes mellitus with foot ulcer: Secondary | ICD-10-CM | POA: Diagnosis not present

## 2020-02-27 DIAGNOSIS — F339 Major depressive disorder, recurrent, unspecified: Secondary | ICD-10-CM | POA: Diagnosis not present

## 2020-02-27 DIAGNOSIS — E1140 Type 2 diabetes mellitus with diabetic neuropathy, unspecified: Secondary | ICD-10-CM | POA: Diagnosis not present

## 2020-02-28 DIAGNOSIS — R519 Headache, unspecified: Secondary | ICD-10-CM | POA: Diagnosis not present

## 2020-03-11 DIAGNOSIS — D649 Anemia, unspecified: Secondary | ICD-10-CM | POA: Diagnosis not present

## 2020-03-11 DIAGNOSIS — I129 Hypertensive chronic kidney disease with stage 1 through stage 4 chronic kidney disease, or unspecified chronic kidney disease: Secondary | ICD-10-CM | POA: Diagnosis not present

## 2020-03-11 DIAGNOSIS — E1122 Type 2 diabetes mellitus with diabetic chronic kidney disease: Secondary | ICD-10-CM | POA: Diagnosis not present

## 2020-03-11 DIAGNOSIS — N250 Renal osteodystrophy: Secondary | ICD-10-CM | POA: Diagnosis not present

## 2020-03-11 DIAGNOSIS — N1831 Chronic kidney disease, stage 3a: Secondary | ICD-10-CM | POA: Diagnosis not present

## 2020-03-11 DIAGNOSIS — N183 Chronic kidney disease, stage 3 unspecified: Secondary | ICD-10-CM | POA: Diagnosis not present

## 2020-03-11 DIAGNOSIS — Z794 Long term (current) use of insulin: Secondary | ICD-10-CM | POA: Diagnosis not present

## 2020-03-11 DIAGNOSIS — I10 Essential (primary) hypertension: Secondary | ICD-10-CM | POA: Diagnosis not present

## 2020-03-12 DIAGNOSIS — L97519 Non-pressure chronic ulcer of other part of right foot with unspecified severity: Secondary | ICD-10-CM | POA: Diagnosis not present

## 2020-03-12 DIAGNOSIS — I350 Nonrheumatic aortic (valve) stenosis: Secondary | ICD-10-CM | POA: Diagnosis not present

## 2020-03-12 DIAGNOSIS — F339 Major depressive disorder, recurrent, unspecified: Secondary | ICD-10-CM | POA: Diagnosis not present

## 2020-03-12 DIAGNOSIS — E1140 Type 2 diabetes mellitus with diabetic neuropathy, unspecified: Secondary | ICD-10-CM | POA: Diagnosis not present

## 2020-03-12 DIAGNOSIS — I739 Peripheral vascular disease, unspecified: Secondary | ICD-10-CM | POA: Diagnosis not present

## 2020-03-12 DIAGNOSIS — E11621 Type 2 diabetes mellitus with foot ulcer: Secondary | ICD-10-CM | POA: Diagnosis not present

## 2020-03-24 DIAGNOSIS — Z89421 Acquired absence of other right toe(s): Secondary | ICD-10-CM | POA: Diagnosis not present

## 2020-03-24 DIAGNOSIS — R278 Other lack of coordination: Secondary | ICD-10-CM | POA: Diagnosis not present

## 2020-03-24 DIAGNOSIS — M6281 Muscle weakness (generalized): Secondary | ICD-10-CM | POA: Diagnosis not present

## 2020-03-24 DIAGNOSIS — M6701 Short Achilles tendon (acquired), right ankle: Secondary | ICD-10-CM | POA: Diagnosis not present

## 2020-03-24 DIAGNOSIS — R2681 Unsteadiness on feet: Secondary | ICD-10-CM | POA: Diagnosis not present

## 2020-03-24 DIAGNOSIS — R296 Repeated falls: Secondary | ICD-10-CM | POA: Diagnosis not present

## 2020-03-26 DIAGNOSIS — Z89421 Acquired absence of other right toe(s): Secondary | ICD-10-CM | POA: Diagnosis not present

## 2020-03-26 DIAGNOSIS — M6701 Short Achilles tendon (acquired), right ankle: Secondary | ICD-10-CM | POA: Diagnosis not present

## 2020-03-26 DIAGNOSIS — M6281 Muscle weakness (generalized): Secondary | ICD-10-CM | POA: Diagnosis not present

## 2020-03-26 DIAGNOSIS — R2681 Unsteadiness on feet: Secondary | ICD-10-CM | POA: Diagnosis not present

## 2020-03-26 DIAGNOSIS — R278 Other lack of coordination: Secondary | ICD-10-CM | POA: Diagnosis not present

## 2020-03-26 DIAGNOSIS — R296 Repeated falls: Secondary | ICD-10-CM | POA: Diagnosis not present

## 2020-03-27 DIAGNOSIS — Z7982 Long term (current) use of aspirin: Secondary | ICD-10-CM | POA: Diagnosis not present

## 2020-03-27 DIAGNOSIS — R531 Weakness: Secondary | ICD-10-CM | POA: Diagnosis not present

## 2020-03-27 DIAGNOSIS — Z7902 Long term (current) use of antithrombotics/antiplatelets: Secondary | ICD-10-CM | POA: Diagnosis not present

## 2020-03-27 DIAGNOSIS — I951 Orthostatic hypotension: Secondary | ICD-10-CM | POA: Diagnosis not present

## 2020-03-27 DIAGNOSIS — R001 Bradycardia, unspecified: Secondary | ICD-10-CM | POA: Diagnosis not present

## 2020-03-27 DIAGNOSIS — E781 Pure hyperglyceridemia: Secondary | ICD-10-CM | POA: Diagnosis not present

## 2020-03-27 DIAGNOSIS — Z888 Allergy status to other drugs, medicaments and biological substances status: Secondary | ICD-10-CM | POA: Diagnosis not present

## 2020-03-27 DIAGNOSIS — I10 Essential (primary) hypertension: Secondary | ICD-10-CM | POA: Diagnosis not present

## 2020-03-27 DIAGNOSIS — I2510 Atherosclerotic heart disease of native coronary artery without angina pectoris: Secondary | ICD-10-CM | POA: Diagnosis not present

## 2020-03-27 DIAGNOSIS — Z794 Long term (current) use of insulin: Secondary | ICD-10-CM | POA: Diagnosis not present

## 2020-03-27 DIAGNOSIS — S4991XA Unspecified injury of right shoulder and upper arm, initial encounter: Secondary | ICD-10-CM | POA: Diagnosis not present

## 2020-03-27 DIAGNOSIS — Z79899 Other long term (current) drug therapy: Secondary | ICD-10-CM | POA: Diagnosis not present

## 2020-03-27 DIAGNOSIS — Z91030 Bee allergy status: Secondary | ICD-10-CM | POA: Diagnosis not present

## 2020-03-27 DIAGNOSIS — M25511 Pain in right shoulder: Secondary | ICD-10-CM | POA: Diagnosis not present

## 2020-03-27 DIAGNOSIS — E119 Type 2 diabetes mellitus without complications: Secondary | ICD-10-CM | POA: Diagnosis not present

## 2020-03-27 DIAGNOSIS — E079 Disorder of thyroid, unspecified: Secondary | ICD-10-CM | POA: Diagnosis not present

## 2020-03-27 DIAGNOSIS — Z951 Presence of aortocoronary bypass graft: Secondary | ICD-10-CM | POA: Diagnosis not present

## 2020-03-27 DIAGNOSIS — Z952 Presence of prosthetic heart valve: Secondary | ICD-10-CM | POA: Diagnosis not present

## 2020-03-27 DIAGNOSIS — Z79890 Hormone replacement therapy (postmenopausal): Secondary | ICD-10-CM | POA: Diagnosis not present

## 2020-03-27 DIAGNOSIS — R519 Headache, unspecified: Secondary | ICD-10-CM | POA: Diagnosis not present

## 2020-03-31 DIAGNOSIS — E1165 Type 2 diabetes mellitus with hyperglycemia: Secondary | ICD-10-CM | POA: Diagnosis not present

## 2020-04-01 DIAGNOSIS — M6701 Short Achilles tendon (acquired), right ankle: Secondary | ICD-10-CM | POA: Diagnosis not present

## 2020-04-01 DIAGNOSIS — R278 Other lack of coordination: Secondary | ICD-10-CM | POA: Diagnosis not present

## 2020-04-01 DIAGNOSIS — Z89421 Acquired absence of other right toe(s): Secondary | ICD-10-CM | POA: Diagnosis not present

## 2020-04-01 DIAGNOSIS — R296 Repeated falls: Secondary | ICD-10-CM | POA: Diagnosis not present

## 2020-04-01 DIAGNOSIS — R2681 Unsteadiness on feet: Secondary | ICD-10-CM | POA: Diagnosis not present

## 2020-04-01 DIAGNOSIS — L570 Actinic keratosis: Secondary | ICD-10-CM | POA: Diagnosis not present

## 2020-04-01 DIAGNOSIS — M6281 Muscle weakness (generalized): Secondary | ICD-10-CM | POA: Diagnosis not present

## 2020-04-11 DIAGNOSIS — E1122 Type 2 diabetes mellitus with diabetic chronic kidney disease: Secondary | ICD-10-CM | POA: Diagnosis not present

## 2020-04-11 DIAGNOSIS — I129 Hypertensive chronic kidney disease with stage 1 through stage 4 chronic kidney disease, or unspecified chronic kidney disease: Secondary | ICD-10-CM | POA: Diagnosis not present

## 2020-04-11 DIAGNOSIS — N183 Chronic kidney disease, stage 3 unspecified: Secondary | ICD-10-CM | POA: Diagnosis not present

## 2020-04-11 DIAGNOSIS — E7849 Other hyperlipidemia: Secondary | ICD-10-CM | POA: Diagnosis not present

## 2021-06-04 IMAGING — MR MR FOOT*R* W/O CM
4 of 5 series · 19 of 40 positions shown · non-contrast
Comparison: None.

CLINICAL DATA: Osteomyelitis of the right foot. Serosanguineous
just charge from the ulcer of the right foot.

EXAM:
MRI OF THE RIGHT FOREFOOT WITHOUT CONTRAST
TECHNIQUE: Multiplanar, multisequence MR imaging of the right forefoot was
performed. No intravenous contrast was administered.

[Series 9: T1 · oblique · 4.0mm · 0.31mm/px · 8 of 35 slices shown (1 of 2)]
[im 1/35]
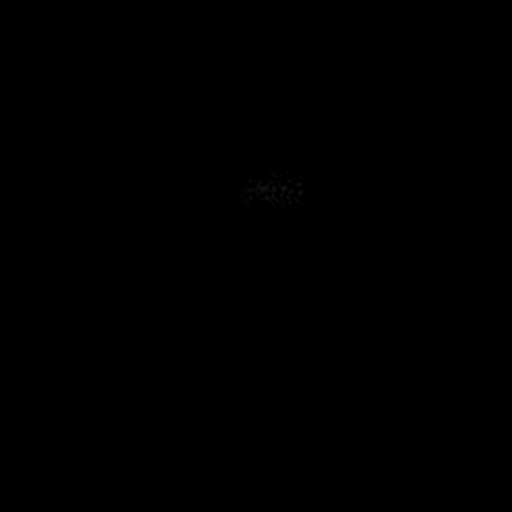
[im 4/35]
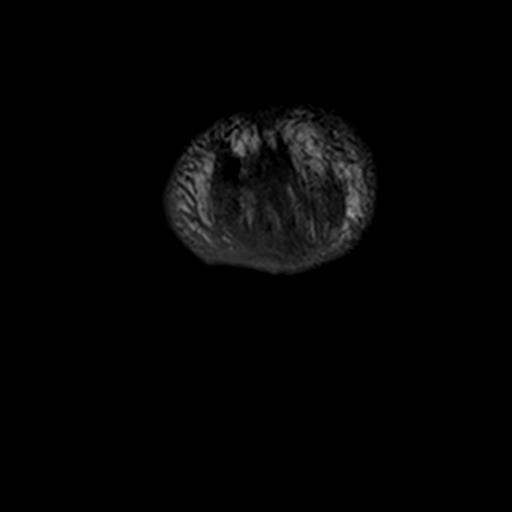
[im 12/35]
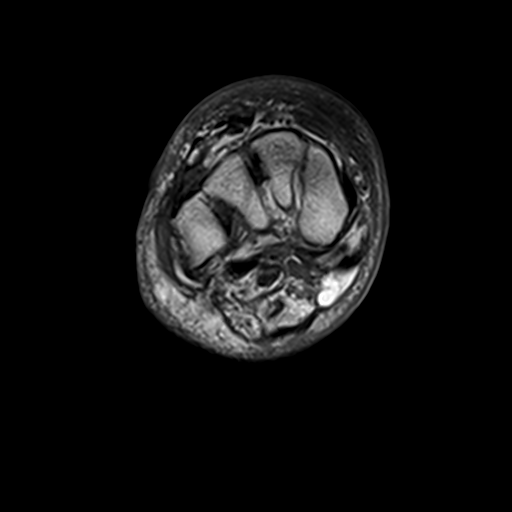
[im 16/35]
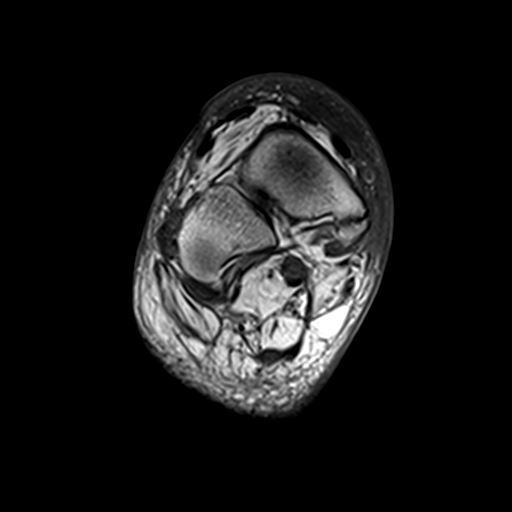
[im 19/35]
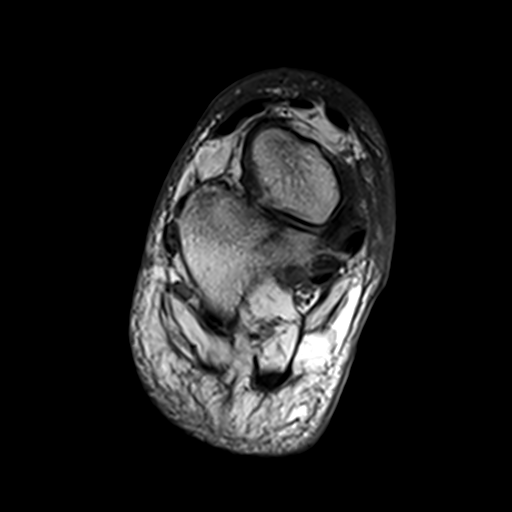
[im 23/35]
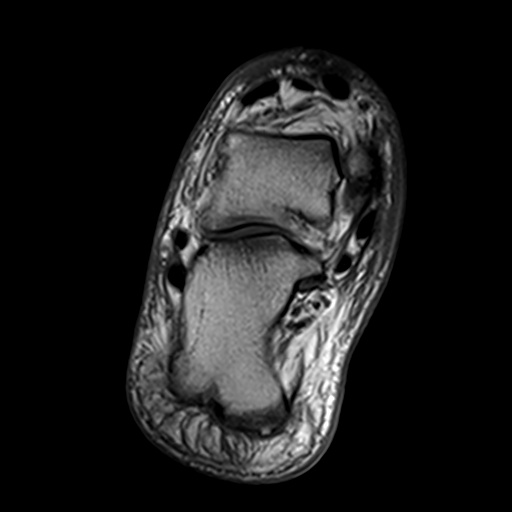
[im 31/35]
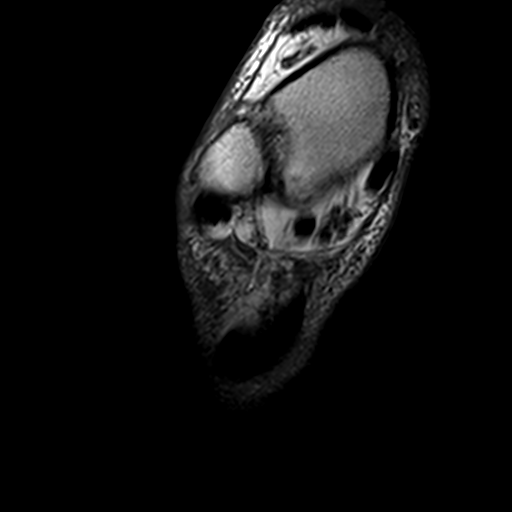
[im 35/35]
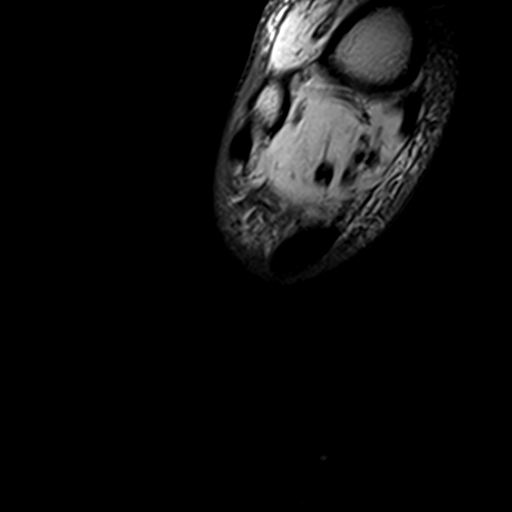

[Series 10: t2fs axial · oblique · 4.0mm · 0.31mm/px · 3 of 35 slices shown]
[im 4/35]
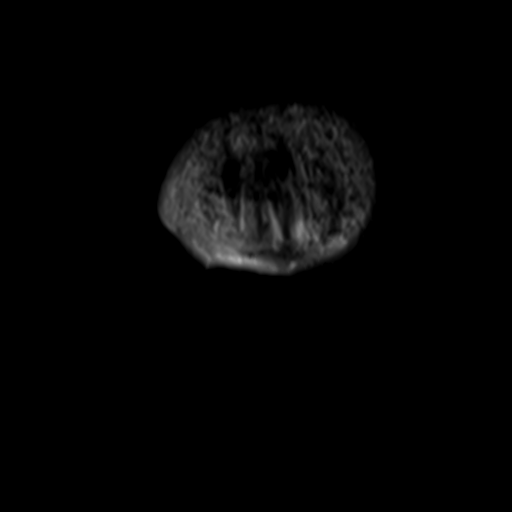
[im 18/35]
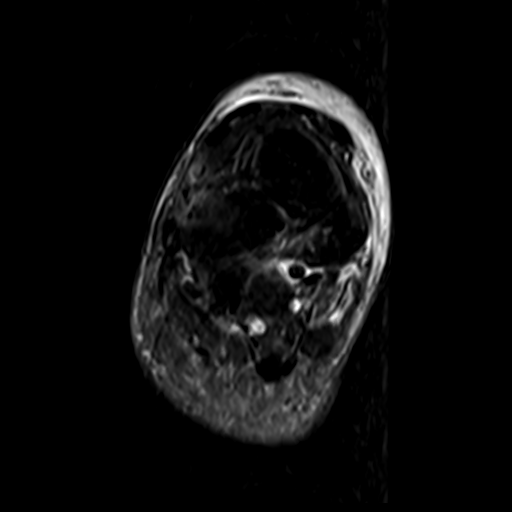
[im 31/35]
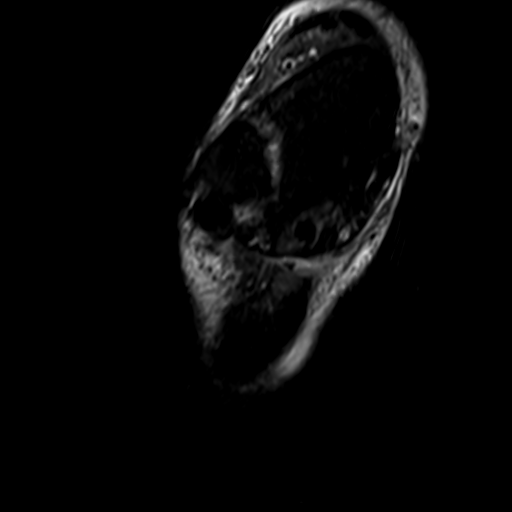

[Series 11: T1 · sagittal · 4.0mm · 0.39mm/px · 5 of 22 slices shown (2 of 2)]
[im 1/22]
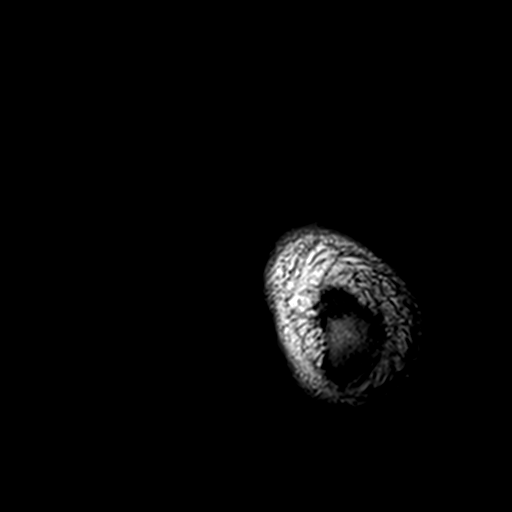
[im 4/22]
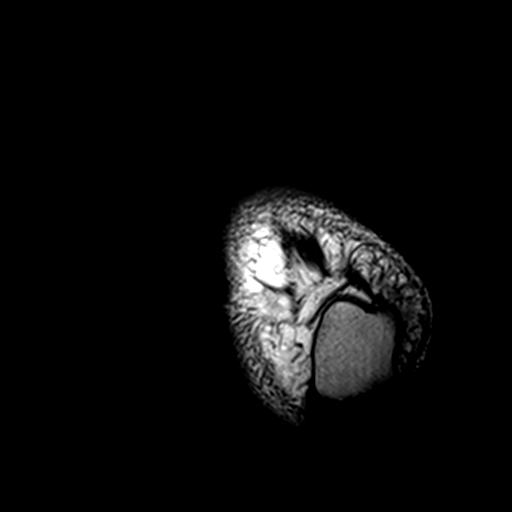
[im 8/22]
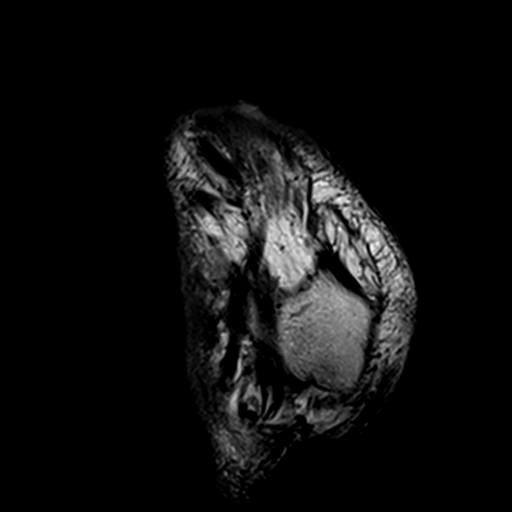
[im 11/22]
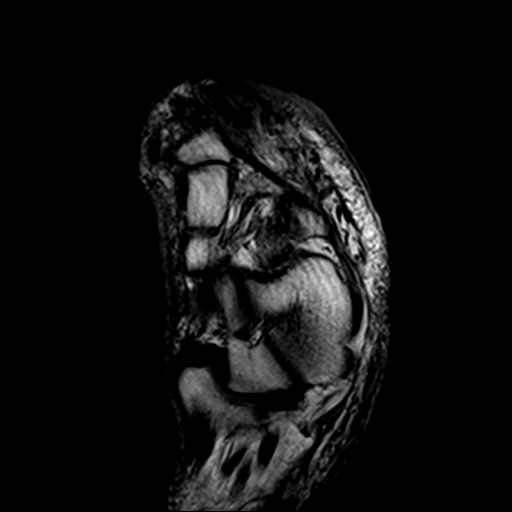
[im 18/22]
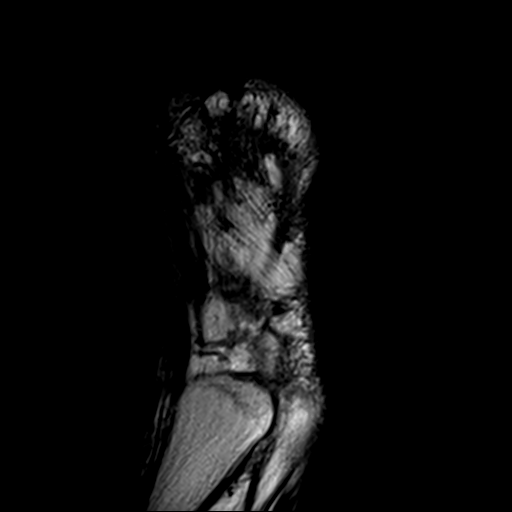

[Series 12: t2fs cor · sagittal · 4.0mm · 0.39mm/px · 3 of 18 slices shown]
[im 1/18]
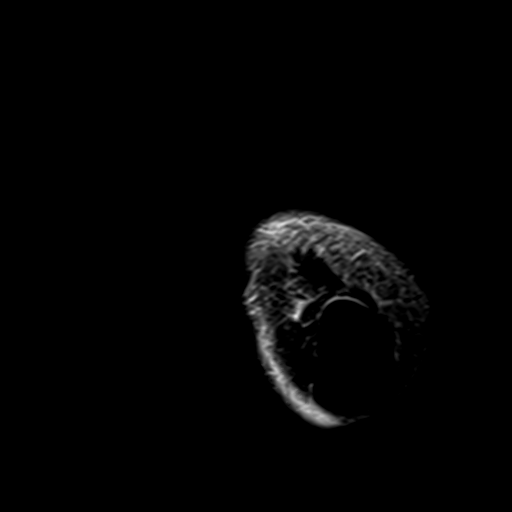
[im 9/18]
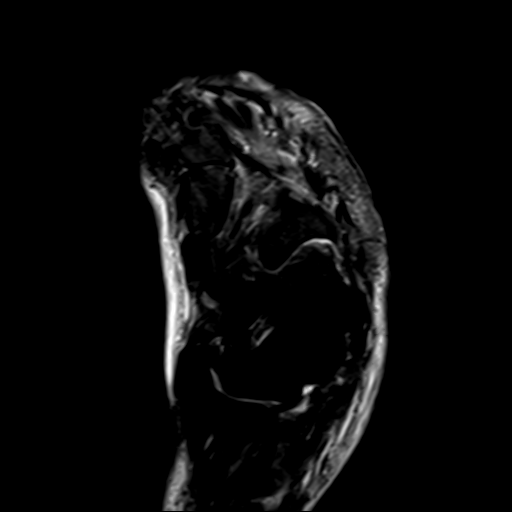
[im 18/18]
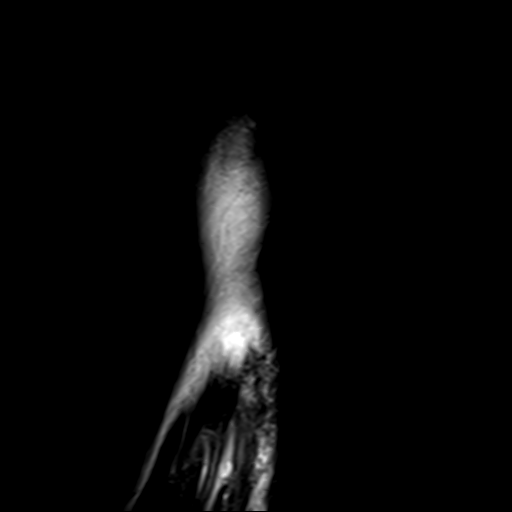

[19 of 40 positions shown; findings below may reference images not displayed]

FINDINGS: Bones/Joint/Cartilage

Prior transmetatarsal amputation. No fracture or dislocation. Normal
alignment. No joint effusion.

Ligaments, Muscles and Tendons
Flexor, peroneal and extensor compartment tendons are intact.
Generalized muscle atrophy.

Soft tissue
No fluid collection or hematoma. No soft tissue mass. Soft tissue
edema along the dorsal aspect of the foot which may be reactive
versus secondary to mild cellulitis.
IMPRESSION: 1. Prior transmetatarsal amputation. No osteomyelitis of the right
foot.

## 2021-12-10 ENCOUNTER — Emergency Department
Admission: EM | Admit: 2021-12-10 | Discharge: 2021-12-10 | Disposition: A | Discharge status: Home / Self Care | Payer: Medicare Other | Attending: PHYSICIAN ASSISTANT | Admitting: PHYSICIAN ASSISTANT

## 2021-12-10 ENCOUNTER — Encounter (HOSPITAL_COMMUNITY): Payer: Self-pay

## 2021-12-10 ENCOUNTER — Other Ambulatory Visit: Payer: Self-pay

## 2021-12-10 DIAGNOSIS — Z9641 Presence of insulin pump (external) (internal): Secondary | ICD-10-CM

## 2021-12-10 DIAGNOSIS — Z794 Long term (current) use of insulin: Secondary | ICD-10-CM | POA: Insufficient documentation

## 2021-12-10 DIAGNOSIS — C61 Malignant neoplasm of prostate: Secondary | ICD-10-CM | POA: Insufficient documentation

## 2021-12-10 DIAGNOSIS — R9431 Abnormal electrocardiogram [ECG] [EKG]: Secondary | ICD-10-CM | POA: Insufficient documentation

## 2021-12-10 DIAGNOSIS — I452 Bifascicular block: Secondary | ICD-10-CM | POA: Insufficient documentation

## 2021-12-10 DIAGNOSIS — E11649 Type 2 diabetes mellitus with hypoglycemia without coma: Secondary | ICD-10-CM | POA: Insufficient documentation

## 2021-12-10 DIAGNOSIS — E162 Hypoglycemia, unspecified: Secondary | ICD-10-CM

## 2021-12-10 DIAGNOSIS — R339 Retention of urine, unspecified: Secondary | ICD-10-CM

## 2021-12-10 LAB — COMPREHENSIVE METABOLIC PANEL, NON-FASTING
ALBUMIN/GLOBULIN RATIO: 1.3 (ref 0.8–1.4)
ALBUMIN: 3.8 g/dL (ref 3.5–5.7)
ALKALINE PHOSPHATASE: 100 U/L (ref 34–104)
ALT (SGPT): 13 U/L (ref 7–52)
ANION GAP: 8 mmol/L (ref 4–13)
AST (SGOT): 18 U/L (ref 13–39)
BILIRUBIN TOTAL: 0.9 mg/dL (ref 0.3–1.2)
BUN/CREA RATIO: 24 — ABNORMAL HIGH (ref 6–22)
BUN: 23 mg/dL (ref 7–25)
CALCIUM, CORRECTED: 9.3 mg/dL (ref 8.9–10.8)
CALCIUM: 9.1 mg/dL (ref 8.6–10.3)
CHLORIDE: 108 mmol/L — ABNORMAL HIGH (ref 98–107)
CO2 TOTAL: 25 mmol/L (ref 21–31)
CREATININE: 0.97 mg/dL (ref 0.60–1.30)
ESTIMATED GFR: 81 mL/min/{1.73_m2} (ref >59)
GLOBULIN: 2.9 (ref 2.9–5.4)
GLUCOSE: 86 mg/dL (ref 74–109)
OSMOLALITY, CALCULATED: 284 mOsm/kg (ref 270–290)
POTASSIUM: 3.4 mmol/L — ABNORMAL LOW (ref 3.5–5.1)
PROTEIN TOTAL: 6.7 g/dL (ref 6.4–8.9)
SODIUM: 141 mmol/L (ref 136–145)

## 2021-12-10 LAB — CBC WITH DIFF
BASOPHIL #: 0 x10ˆ3/uL (ref 0.00–0.30)
BASOPHIL %: 0 % (ref 0–3)
EOSINOPHIL #: 0 x10ˆ3/uL (ref 0.00–0.80)
EOSINOPHIL %: 1 % (ref 0–7)
HCT: 38.1 % — ABNORMAL LOW (ref 42.0–51.0)
HGB: 13.1 g/dL — ABNORMAL LOW (ref 13.5–18.0)
LYMPHOCYTE #: 1.1 x10ˆ3/uL (ref 1.10–5.00)
LYMPHOCYTE %: 15 % — ABNORMAL LOW (ref 25–45)
MCH: 32.2 pg — ABNORMAL HIGH (ref 27.0–32.0)
MCHC: 34.5 g/dL (ref 32.0–36.0)
MCV: 93.5 fL (ref 78.0–99.0)
MONOCYTE #: 0.3 x10ˆ3/uL (ref 0.00–1.30)
MONOCYTE %: 4 % (ref 0–12)
MPV: 8.4 fL (ref 7.4–10.4)
NEUTROPHIL #: 5.9 x10ˆ3/uL (ref 1.80–8.40)
NEUTROPHIL %: 80 % — ABNORMAL HIGH (ref 40–76)
PLATELETS: 184 x10ˆ3/uL (ref 140–440)
RBC: 4.07 x10ˆ6/uL — ABNORMAL LOW (ref 4.20–6.00)
RDW: 13.6 % (ref 11.6–14.8)
WBC: 7.3 x10ˆ3/uL (ref 4.0–10.5)
WBCS UNCORRECTED: 7.3 x10^3/uL

## 2021-12-10 LAB — URINALYSIS, MACROSCOPIC
BILIRUBIN: NEGATIVE mg/dL
BLOOD: NEGATIVE mg/dL
GLUCOSE: NEGATIVE mg/dL
KETONES: NEGATIVE mg/dL
LEUKOCYTES: NEGATIVE WBCs/uL
NITRITE: NEGATIVE
PH: 6 (ref 5.0–9.0)
PROTEIN: NEGATIVE mg/dL
SPECIFIC GRAVITY: 1.016 (ref 1.002–1.030)
UROBILINOGEN: NORMAL mg/dL

## 2021-12-10 LAB — URINALYSIS, MICROSCOPIC
HYALINE CASTS: 1 /lpf — ABNORMAL HIGH (ref <0)
WBCS: 1 /hpf (ref <6)

## 2021-12-10 LAB — MAGNESIUM: MAGNESIUM: 1.8 mg/dL — ABNORMAL LOW (ref 1.9–2.7)

## 2021-12-10 LAB — BLUE TOP TUBE

## 2021-12-10 LAB — POC BLOOD GLUCOSE (RESULTS)
GLUCOSE, POC: 76 mg/dl (ref 50–500)
GLUCOSE, POC: 78 mg/dl (ref 50–500)
GLUCOSE, POC: 98 mg/dl (ref 50–500)

## 2021-12-10 MED ORDER — CEFDINIR 300 MG CAPSULE
300.0000 mg | ORAL_CAPSULE | Freq: Two times a day (BID) | ORAL | 0 refills | Status: AC
Start: 2021-12-10 — End: 2021-12-20

## 2021-12-10 MED ORDER — POTASSIUM CHLORIDE ER 20 MEQ TABLET,EXTENDED RELEASE(PART/CRYST)
20.0000 meq | ORAL_TABLET | ORAL | Status: AC
Start: 2021-12-10 — End: 2021-12-10
  Administered 2021-12-10: 20 meq via ORAL

## 2021-12-10 MED ORDER — POTASSIUM CHLORIDE ER 20 MEQ TABLET,EXTENDED RELEASE(PART/CRYST)
ORAL_TABLET | ORAL | Status: AC
Start: 2021-12-10 — End: 2021-12-10
  Filled 2021-12-10: qty 1

## 2021-12-10 NOTE — ED Provider Notes (Signed)
[facility]  ED Primary Provider Note  History of Present Illness   Chief Complaint   Patient presents with    Low Blood Sugar     Kristopher Montes is a 76 y.o. male who had concerns including Low Blood Sugar.  Arrival: The patient arrived by Ambulance    76 year old male presents by EMS emergency department due to hypoglycemic episode.  Patient is a insulin-dependent diabetic.  Patient states he did not eat dinner last night but just had an apple and some moments.  Per the patient's wife he was clammy and confused this morning.  Patient's insulin pump was reading out 182 however when EMS arrived patient's blood sugar was in the 40s.  He did receive an amp of D50 EN route.  Patient is more alert and oriented on presentation.  Patient was also complaining of urinary retention.  He does have history of active prostate cancer.    Review of Systems   Pertinent positive and negative ROS as per HPI.  Historical Data   History Reviewed This Encounter: Medical History  Surgical History  Family History  Social History    Physical Exam   ED Triage Vitals [12/10/21 1146]   BP (Non-Invasive) 112/67   Heart Rate 71   Respiratory Rate 16   Temperature 36 C (96.8 F)   SpO2 97 %   Weight 93 kg (205 lb)   Height 1.803 m ('5\' 11"' )     Physical Exam  Vitals reviewed.   Constitutional:       Appearance: Normal appearance.   HENT:      Head: Normocephalic and atraumatic.   Cardiovascular:      Rate and Rhythm: Normal rate and regular rhythm.      Pulses: Normal pulses.      Heart sounds: Normal heart sounds.   Pulmonary:      Effort: Pulmonary effort is normal.      Breath sounds: Normal breath sounds.   Abdominal:      General: Abdomen is flat. Bowel sounds are normal.      Palpations: Abdomen is soft.      Tenderness: There is abdominal tenderness (Suprapubic).   Musculoskeletal:         General: Normal range of motion.   Skin:     General: Skin is warm and dry.   Neurological:      Mental Status: He is  alert and oriented to person, place, and time.   Psychiatric:         Mood and Affect: Mood normal.     Patient Data     Labs Ordered/Reviewed   COMPREHENSIVE METABOLIC PANEL, NON-FASTING - Abnormal; Notable for the following components:       Result Value    POTASSIUM 3.4 (*)     CHLORIDE 108 (*)     BUN/CREA RATIO 24 (*)     All other components within normal limits    Narrative:     Estimated Glomerular Filtration Rate (eGFR) is calculated using the CKD-EPI (2021) equation, intended for patients 73 years of age and older. If gender is not documented or "unknown", there will be no eGFR calculation.   MAGNESIUM - Abnormal; Notable for the following components:    MAGNESIUM 1.8 (*)     All other components within normal limits   CBC WITH DIFF - Abnormal; Notable for the following components:    RBC 4.07 (*)     HGB 13.1 (*)  HCT 38.1 (*)     MCH 32.2 (*)     NEUTROPHIL % 80 (*)     LYMPHOCYTE % 15 (*)     All other components within normal limits   URINALYSIS, MICROSCOPIC - Abnormal; Notable for the following components:    HYALINE CASTS 1 (*)     All other components within normal limits   URINALYSIS, MACROSCOPIC - Normal   POC BLOOD GLUCOSE (RESULTS) - Normal   POC BLOOD GLUCOSE (RESULTS) - Normal   POC BLOOD GLUCOSE (RESULTS) - Normal   CBC/DIFF    Narrative:     The following orders were created for panel order CBC/DIFF.  Procedure                               Abnormality         Status                     ---------                               -----------         ------                     CBC WITH MLYY[[id number]]                Abnormal            Final result                 Please view results for these tests on the individual orders.   URINALYSIS, MACROSCOPIC AND MICROSCOPIC W/CULTURE REFLEX    Narrative:     The following orders were created for panel order URINALYSIS, MACROSCOPIC AND MICROSCOPIC W/CULTURE REFLEX.  Procedure                               Abnormality         Status                      ---------                               -----------         ------                     URINALYSIS, MACROSCOPIC[[id number]]      Normal              Final result               URINALYSIS, MICROSCOPIC[[id number]]      Abnormal            Final result                 Please view results for these tests on the individual orders.   BLUE TOP TUBE   PERFORM POC WHOLE BLOOD GLUCOSE   PERFORM POC WHOLE BLOOD GLUCOSE   PERFORM POC WHOLE BLOOD GLUCOSE   PERFORM POC WHOLE BLOOD GLUCOSE   PERFORM POC WHOLE BLOOD GLUCOSE   PERFORM POC WHOLE BLOOD GLUCOSE   PERFORM POC WHOLE BLOOD GLUCOSE   PERFORM POC WHOLE BLOOD GLUCOSE     No orders  to display     Medical Decision Making        Medical Decision Making  Patient had presented for hypoglycemia.  Patient's blood sugar did stabilize.  He was back at baseline per the wife.  Bladder scan showed over a 1000 cc in the bladder.  Foley catheter was placed.    Amount and/or Complexity of Data Reviewed  Labs: ordered.  ECG/medicine tests: ordered.    Risk  Prescription drug management.             Medications Administered in the ED   potassium chloride (K-DUR) extended release tablet (20 mEq Oral Given 12/10/21 1513)     Clinical Impression   Hypoglycemia (Primary)   Urinary retention       Disposition: Discharged

## 2021-12-10 NOTE — Discharge Instructions (Signed)
Drink plenty of fluids. Continue any at home medications as previously prescribed. Take medications that are prescribed from today's visit as prescribed. Discuss any questions you may have concerning your medications with your pharmacist. Follow up with your regular PCP in the next 2-3 days. Return to the ED if symptoms worsen, change, or do not improve.

## 2021-12-10 NOTE — ED Triage Notes (Signed)
Low blood sugar, mostly unresponsive to EMS. Pt has insulin pump, EMS reports that pump machine was reading 202 but their reading was 49. Pt became alert and oriented after dextrose administration.    [employer]: d5 12.5, 18g rac, monitor

## 2021-12-10 NOTE — Nurses Notes (Signed)
Patient discharged to home at this time and left the department via wheelchair with ER staff.

## 2021-12-10 NOTE — ED Nurses Note (Signed)
Leg bag placed onto patient at this time.

## 2021-12-18 LAB — ECG 12 LEAD
Atrial Rate: 62 {beats}/min
Calculated P Axis: 65 degrees
Calculated R Axis: -80 degrees
Calculated T Axis: 49 degrees
PR Interval: 102 ms
QRS Duration: 158 ms
QT Interval: 534 ms
QTC Calculation: 542 ms
Ventricular rate: 62 {beats}/min

## 2021-12-26 ENCOUNTER — Other Ambulatory Visit: Payer: Self-pay
